# Patient Record
Sex: Female | Born: 1940
Health system: Southern US, Community
[De-identification: ages and names within clinical notes are randomized; demographics above are authoritative.]

## PROBLEM LIST (undated history)

## (undated) DIAGNOSIS — F0390 Unspecified dementia without behavioral disturbance: Secondary | ICD-10-CM

## (undated) DIAGNOSIS — K859 Acute pancreatitis without necrosis or infection, unspecified: Secondary | ICD-10-CM

## (undated) DIAGNOSIS — I10 Essential (primary) hypertension: Secondary | ICD-10-CM

## (undated) HISTORY — PX: CHOLECYSTECTOMY: SHX55

---

## 2012-11-17 ENCOUNTER — Emergency Department: Payer: Self-pay | Admitting: Emergency Medicine

## 2012-11-17 LAB — CBC WITH DIFFERENTIAL/PLATELET
Eosinophil %: 0.5 %
HGB: 13.8 g/dL (ref 12.0–16.0)
Lymphocyte #: 2.9 10*3/uL (ref 1.0–3.6)
Lymphocyte %: 32 %
MCH: 30.1 pg (ref 26.0–34.0)
MCHC: 35.1 g/dL (ref 32.0–36.0)
MCV: 86 fL (ref 80–100)
Monocyte #: 0.7 x10 3/mm (ref 0.2–0.9)
Neutrophil #: 5.3 10*3/uL (ref 1.4–6.5)
RBC: 4.58 10*6/uL (ref 3.80–5.20)
WBC: 9 10*3/uL (ref 3.6–11.0)

## 2012-11-17 LAB — COMPREHENSIVE METABOLIC PANEL
Albumin: 4 g/dL (ref 3.4–5.0)
Alkaline Phosphatase: 67 U/L (ref 50–136)
BUN: 13 mg/dL (ref 7–18)
Bilirubin,Total: 0.7 mg/dL (ref 0.2–1.0)
Chloride: 98 mmol/L (ref 98–107)
Co2: 26 mmol/L (ref 21–32)
Creatinine: 1.11 mg/dL (ref 0.60–1.30)
EGFR (African American): 57 — ABNORMAL LOW
Glucose: 88 mg/dL (ref 65–99)
Osmolality: 270 (ref 275–301)
Potassium: 2.9 mmol/L — ABNORMAL LOW (ref 3.5–5.1)
SGPT (ALT): 24 U/L (ref 12–78)
Total Protein: 7.6 g/dL (ref 6.4–8.2)

## 2012-11-17 LAB — CK TOTAL AND CKMB (NOT AT ARMC): CK-MB: <0.5 ng/mL — ABNORMAL LOW (ref 0.5–3.6)

## 2012-11-17 LAB — LIPASE, BLOOD: Lipase: 279 U/L (ref 73–393)

## 2012-11-17 LAB — MAGNESIUM: Magnesium: 1.8 mg/dL

## 2012-11-27 LAB — CBC
HCT: 38 % (ref 35.0–47.0)
HGB: 14 g/dL (ref 12.0–16.0)
MCH: 31.2 pg (ref 26.0–34.0)
MCHC: 36.7 g/dL — ABNORMAL HIGH (ref 32.0–36.0)
MCV: 85 fL (ref 80–100)
Platelet: 302 10*3/uL (ref 150–440)
RBC: 4.48 10*6/uL (ref 3.80–5.20)
RDW: 14 % (ref 11.5–14.5)
WBC: 9.6 10*3/uL (ref 3.6–11.0)

## 2012-11-27 LAB — CK TOTAL AND CKMB (NOT AT ARMC): CK-MB: 0.7 ng/mL (ref 0.5–3.6)

## 2012-11-27 LAB — COMPREHENSIVE METABOLIC PANEL
Alkaline Phosphatase: 69 U/L (ref 50–136)
Anion Gap: 11 (ref 7–16)
BUN: 13 mg/dL (ref 7–18)
Chloride: 89 mmol/L — ABNORMAL LOW (ref 98–107)
Co2: 27 mmol/L (ref 21–32)
EGFR (African American): >60
EGFR (Non-African Amer.): 57 — ABNORMAL LOW
Glucose: 77 mg/dL (ref 65–99)
Potassium: 3.2 mmol/L — ABNORMAL LOW (ref 3.5–5.1)
Total Protein: 7.8 g/dL (ref 6.4–8.2)

## 2012-11-28 ENCOUNTER — Observation Stay: Payer: Self-pay | Admitting: Family Medicine

## 2012-11-28 LAB — URINALYSIS, COMPLETE
Bilirubin,UR: NEGATIVE
Glucose,UR: NEGATIVE mg/dL (ref 0–75)
Hyaline Cast: 1
Nitrite: NEGATIVE
Ph: 5 (ref 4.5–8.0)
Protein: NEGATIVE
RBC,UR: 4 /HPF (ref 0–5)
Specific Gravity: 1.015 (ref 1.003–1.030)
Squamous Epithelial: 5

## 2012-11-28 LAB — BASIC METABOLIC PANEL
Anion Gap: 9 (ref 7–16)
Chloride: 91 mmol/L — ABNORMAL LOW (ref 98–107)
Co2: 27 mmol/L (ref 21–32)
EGFR (African American): >60
EGFR (Non-African Amer.): >60
Osmolality: 255 (ref 275–301)
Potassium: 3.1 mmol/L — ABNORMAL LOW (ref 3.5–5.1)
Sodium: 127 mmol/L — ABNORMAL LOW (ref 136–145)

## 2012-11-28 LAB — TROPONIN I
Troponin-I: <0.02 ng/mL
Troponin-I: <0.02 ng/mL

## 2012-11-28 LAB — T4, FREE: Free Thyroxine: 1.28 ng/dL (ref 0.76–1.46)

## 2012-11-28 LAB — PRO B NATRIURETIC PEPTIDE: B-Type Natriuretic Peptide: 144 pg/mL — ABNORMAL HIGH (ref 0–125)

## 2012-11-28 LAB — TSH: Thyroid Stimulating Horm: 0.873 u[IU]/mL

## 2012-11-29 LAB — CBC WITH DIFFERENTIAL/PLATELET
Basophil %: 0.6 %
Eosinophil %: 0.5 %
HCT: 35.7 % (ref 35.0–47.0)
HGB: 12.7 g/dL (ref 12.0–16.0)
Lymphocyte #: 2.3 10*3/uL (ref 1.0–3.6)
Lymphocyte %: 35.9 %
MCHC: 35.5 g/dL (ref 32.0–36.0)
Monocyte #: 0.6 x10 3/mm (ref 0.2–0.9)
Monocyte %: 8.5 %
WBC: 6.5 10*3/uL (ref 3.6–11.0)

## 2012-11-29 LAB — BASIC METABOLIC PANEL
Anion Gap: 4 — ABNORMAL LOW (ref 7–16)
BUN: 9 mg/dL (ref 7–18)
Calcium, Total: 8.6 mg/dL (ref 8.5–10.1)
Co2: 28 mmol/L (ref 21–32)
Creatinine: 0.76 mg/dL (ref 0.60–1.30)
EGFR (African American): >60
EGFR (Non-African Amer.): >60
Osmolality: 260 (ref 275–301)

## 2012-11-29 LAB — OSMOLALITY, URINE: Osmolality: 376 mOsm/kg

## 2012-11-29 LAB — SODIUM, URINE, RANDOM: Sodium, Urine Random: 63 mmol/L (ref 20–110)

## 2013-02-01 ENCOUNTER — Emergency Department: Payer: Self-pay | Admitting: Emergency Medicine

## 2013-02-01 LAB — COMPREHENSIVE METABOLIC PANEL
Anion Gap: 7 (ref 7–16)
BUN: 18 mg/dL (ref 7–18)
Bilirubin,Total: 0.3 mg/dL (ref 0.2–1.0)
Glucose: 86 mg/dL (ref 65–99)
Potassium: 3.7 mmol/L (ref 3.5–5.1)
SGOT(AST): 27 U/L (ref 15–37)
Sodium: 140 mmol/L (ref 136–145)
Total Protein: 7.4 g/dL (ref 6.4–8.2)

## 2013-02-01 LAB — CBC
HGB: 13.6 g/dL (ref 12.0–16.0)
MCH: 29.4 pg (ref 26.0–34.0)
MCV: 87 fL (ref 80–100)
Platelet: 295 10*3/uL (ref 150–440)
RBC: 4.61 10*6/uL (ref 3.80–5.20)
RDW: 14 % (ref 11.5–14.5)
WBC: 10.2 10*3/uL (ref 3.6–11.0)

## 2013-02-01 LAB — URINALYSIS, COMPLETE
Bilirubin,UR: NEGATIVE
Glucose,UR: NEGATIVE mg/dL (ref 0–75)
Ketone: NEGATIVE
Ph: 6 (ref 4.5–8.0)
RBC,UR: 5 /HPF (ref 0–5)
Squamous Epithelial: 10
WBC UR: 6 /HPF (ref 0–5)

## 2013-02-01 LAB — TROPONIN I: Troponin-I: <0.02 ng/mL

## 2013-02-01 LAB — DRUG SCREEN, URINE
Amphetamines, Ur Screen: NEGATIVE (ref ?–1000)
Barbiturates, Ur Screen: NEGATIVE (ref ?–200)
Cannabinoid 50 Ng, Ur ~~LOC~~: NEGATIVE (ref ?–50)
Cocaine Metabolite,Ur ~~LOC~~: NEGATIVE (ref ?–300)
MDMA (Ecstasy)Ur Screen: NEGATIVE (ref ?–500)
Methadone, Ur Screen: NEGATIVE (ref ?–300)
Opiate, Ur Screen: NEGATIVE (ref ?–300)
Phencyclidine (PCP) Ur S: NEGATIVE (ref ?–25)

## 2013-02-01 LAB — TSH: Thyroid Stimulating Horm: 0.91 u[IU]/mL

## 2013-02-01 LAB — ETHANOL: Ethanol %: <0.003 % (ref 0.000–0.080)

## 2015-03-03 ENCOUNTER — Emergency Department
Admit: 2015-03-03 | Disposition: A | Discharge status: Home / Self Care | Payer: Self-pay | Admitting: Emergency Medicine

## 2015-03-03 LAB — CBC
HCT: 32.2 % — AB (ref 35.0–47.0)
HGB: 10.4 g/dL — ABNORMAL LOW (ref 12.0–16.0)
MCH: 29.9 pg (ref 26.0–34.0)
MCHC: 32.4 g/dL (ref 32.0–36.0)
MCV: 92 fL (ref 80–100)
Platelet: 367 10*3/uL (ref 150–440)
RBC: 3.49 10*6/uL — ABNORMAL LOW (ref 3.80–5.20)
RDW: 14.6 % — AB (ref 11.5–14.5)
WBC: 11.4 10*3/uL — AB (ref 3.6–11.0)

## 2015-03-03 LAB — COMPREHENSIVE METABOLIC PANEL
ALT: 15 U/L
AST: 31 U/L
Albumin: 4.3 g/dL
Alkaline Phosphatase: 25 U/L — ABNORMAL LOW
Anion Gap: 10 (ref 7–16)
BILIRUBIN TOTAL: 0.9 mg/dL
BUN: 20 mg/dL
Calcium, Total: 8.9 mg/dL
Chloride: 100 mmol/L — ABNORMAL LOW
Co2: 23 mmol/L
Creatinine: 1.1 mg/dL — ABNORMAL HIGH
EGFR (African American): 57 — ABNORMAL LOW
EGFR (Non-African Amer.): 49 — ABNORMAL LOW
Glucose: 107 mg/dL — ABNORMAL HIGH
Potassium: 3.7 mmol/L
SODIUM: 133 mmol/L — AB
Total Protein: 7.2 g/dL

## 2015-03-03 LAB — URINALYSIS, COMPLETE
SPECIFIC GRAVITY: 1.023 (ref 1.003–1.030)
Squamous Epithelial: NONE SEEN

## 2015-03-07 NOTE — Discharge Summary (Signed)
PATIENT NAME:  Alexis Rodriguez, Alexis Rodriguez MR#:  [id number] DATE OF BIRTH:  1941-10-13  DATE OF ADMISSION:  11/28/2012  DATE OF DISCHARGE:  11/29/2012  ADMISSION DIAGNOSIS:  Severe weakness.  DISCHARGE DIAGNOSES: 1.  Generalized weakness.  2.  Hyponatremia.  3.  Hypokalemia.  4.  Urinary tract infection.  5.  Acute bronchitis.  6.  Dysphagia.  7.  History of cerebrovascular accident.  8.  Hypertension.  9.  Chest pain.  10.  Dehydration.   DISPOSITION:  Home.   MEDICATIONS AT DISCHARGE:  Aspirin 81 mg a day, Levaquin 250 mg once daily for 5 more days and lisinopril 20 mg once a day. Lisinopril prescription is to substitute previous prescription of lisinopril/hydrochlorothiazide that patient was taking. Patient no longer needs to take lisinopril/hydrochlorothiazide mixed pill, only the single lisinopril prescription that she has been given today.   IMPORTANT RESULTS: Sodium of 127 on admission, 132 at discharge; creatinine of 0.99, 0.76 at discharge. Other electrolytes: Potassium of 3.2 on admission, 4.9 at discharge. LFTs within normal limits. Troponins were negative. EKG: Normal sinus rhythm. Hemoglobin of 12.7, white count of 6.5. D-dimer was negative, 0.3. Negative influenza.  UA shows increased white blood cells of 10, leukocyte esterase +1 and positive red blood cells. Vitamin B12 level 390.   HOSPITAL COURSE:  The patient is a very nice 74 year old female with history of hypertension and previous stroke. She comes with multiple complaints of feeling very weak, short of breath with nonproductive cough, dysphagia. No fever, no chills. Had some tightness of the chest. Felt really weak. Her sodium level was found to be 127, and her potassium 3.1. The patient looked moderately dehydrated. She was put on IV fluids. Electrolytes were replaced.   The patient did not know what blood pressure medications she was taking, but at discharge we checked with the pharmacy, and the last time that she picked them up  was in a different city, [place]. They checked the central data, and the patient indeed was taking lisinopril with hydrochlorothiazide. Due to the hyponatremia, we stopped the hydrochlorothiazide component and gave her a prescription for lisinopril. The patient is discharged in good condition. She feels like she has enough support at home to take care of herself. We are trying to get her a primary care physician at this moment. The social worker is going to give her a list of the available physician, so she is going to check on it.   I discharged this patient today in good condition.   DISCHARGE TIME:  More than 30 minutes.   ____________________________ Hills Sink, MD rsg:dm D: 11/29/2012 16:11:59 ET T: 11/29/2012 19:51:38 ET JOB#: [id number]  cc: [place] Sink, MD, <Dictator> Averiana Clouatre America Brown MD ELECTRONICALLY SIGNED 12/08/2012 10:59

## 2015-03-07 NOTE — H&P (Signed)
PATIENT NAME:  Alexis Rodriguez, Alexis Rodriguez MR#:  [id number] DATE OF BIRTH:  1941/03/13  DATE OF ADMISSION:  11/28/2012  REFERRING PHYSICIAN: Dr. Conni Slipper.  PRIMARY CARE PHYSICIAN: Currently none, recently moved to [place].   CHIEF COMPLAINT: Shortness of breath, generalized weakness, cough and chest pain.   HISTORY OF PRESENT ILLNESS: This is a 74 year old female with significant past medical history of hypertension and CVA, who presents with multiple complaints. Family reports she has been feeling weak over the last couple of weeks as well as been complaining of shortness of breath and cough, nonproductive as well as had been complaining of some dysphagia as well. Denies any fever or chills. The patient's chest x-ray did not show any abnormalities. White blood cell count was within normal limits and she was afebrile. As well, she did have some complaints of chest pain mainly in the lower chest area accompanied with the cough that was reproducible by palpation. The patient's troponin was negative. She did not have any significant ST or T wave changes in her EKG. As well, she was complaining of feeling generally weak with having no energy and being forgetful. The patient was found to have low sodium level of 127 and low potassium level of 3.1. The patient was in the ED last week for similar complaints where her sodium level was then at 135. Hospitalist service was requested to admit the patient for further management and workup of her symptoms.   PAST MEDICAL HISTORY:  1.  Hypertension.  2.  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   PAST SURGICAL HISTORY: Cholecystectomy.   SOCIAL HISTORY: The patient denies any smoking or alcohol use.   FAMILY HISTORY: Significant for coronary artery disease and diabetes mellitus.   ALLERGIES:  1.  AMOXICILLIN. 2.  PENICILLIN. 3.  SHELLFISH.   HOME MEDICATIONS:  1.  Aspirin 81 mg daily.  2.  She takes blood pressure medication 1 pill a day. She cannot  recall the name.   REVIEW OF SYSTEMS:  CONSTITUTIONAL: Denies any fever or chills. Complains of fatigue and weakness.  EYES: Denies blurry vision, double vision, pain, inflammation or glaucoma. She has right eye blindness.  RESPIRATORY: She has complaints of cough and dyspnea. Denies any wheezing, hemoptysis or chronic obstructive pulmonary disease.  ENT: Denies any tinnitus, ear pain, hearing loss, allergies, epistaxis or discharge.  CARDIOVASCULAR: She has complaints of chest pain. Denies any edema, arrhythmia, palpitations or syncope.  GASTROINTESTINAL: Denies nausea, vomiting, diarrhea, abdominal pain, hematemesis, jaundice or rectal bleed.  GENITOURINARY: Denies dysuria, hematuria, or renal colic.  ENDOCRINE: Denies polyuria, polydipsia, heat or cold intolerance.  HEMATOLOGY: Denies anemia, easy bruising or bleeding diathesis.  INTEGUMENTARY: Denies acne, rash or skin lesions.  MUSCULOSKELETAL: Complains of generalized weakness. Denies arthritis, cramps, gout, limited activity or swelling.  NEUROLOGIC: Denies focal deficits, epilepsy, vertigo, ataxia, dementia or migraine. She has history of CVA in the past with concussion.  PSYCHIATRIC: Denies insomnia, schizophrenia, bipolar disorder or depression. Reports anxiety and nervousness.   PHYSICAL EXAMINATION:  VITAL SIGNS: Temperature 97.6, pulse 82, respiratory rate 18, blood pressure 149/87, saturating 97% on room air.  GENERAL: Well-nourished female who looks comfortable in bed in no apparent distress.  HEENT: Head atraumatic, normocephalic. Pupils equal, reactive to light. Pink conjunctivae. Anicteric sclerae. Moist oral mucosa.  NECK: Supple. No thyromegaly. No JVD.  CHEST: Good air entry bilaterally. No wheezing, rales or rhonchi.  CARDIOVASCULAR: S1, S2 heard. No rubs, murmur, or gallops.  ABDOMEN: Soft, nontender, nondistended. Bowel sounds  present.  EXTREMITIES: No edema. No clubbing. No cyanosis.  PSYCHIATRIC: Appropriate  affect. Awake, alert x 3. Intact judgment and insight.  NEUROLOGIC: Cranial nerves grossly intact. Motor 5 out of 5. Sensation symmetrical. Deep tendon reflexes +2 and symmetrical.  SKIN: Warm and dry. Normal skin turgor.   PERTINENT LABORATORIES: Glucose 82, proBNP 144, BUN 14, creatinine 0.83, sodium 127, potassium 3.1, chloride 91, CO2 27. White blood cells 9.6, hemoglobin 14, hematocrit 38, platelets 302. D-dimer 0.3. Urinalysis showing leukocyte esterase +1, white blood cells 10 and bacteria trace. CT of the brain did not show any acute findings. Chest x-ray was reviewed by me and did not show any opacity or infiltrate. No cardiomegaly. No acute cardiopulmonary disease.   ASSESSMENT AND PLAN: This is a 74 year old female, who presents with multiple nonspecific complaints, mainly shortness of breath, cough, generalized weakness as well found to have hyponatremia, hypokalemia and urinary tract infection. As well, complains of non-typical chest pain. 1.  Generalized weakness. This appears to be multifactorial, mainly due to electrolyte abnormality, hypokalemia and hyponatremia. As well, she is having urinary tract infection and acute bronchitis.  2.  Hyponatremia. This is most likely due to dehydration as she reports a decreased oral fluid intake. As well, it is unclear what blood pressure medication she is taking. If it is diuretics, most likely contributed to her hyponatremia. The patient will be started on intravenous normal saline. We will check urine and serum osmolality and will repeat level in the a.m.  3.  Hypokalemia. We will replace and recheck in a.m.  4.  Urinary tract infection. We will have her on Levaquin.  5.  Acute bronchitis. We will start the patient on Levaquin as well.  6.  Dysphagia. We will have swallow service to evaluate the patient.  7.  History of cerebrovascular accident, the patient denies any new complaints. We will continue her on aspirin.  8.  Hypertension. Currently  acceptable. We will await the family until they bring her medication list from home and resume her on home medications. 9.  Chest pain. This appears to be non-typical as she is having chest pain reproducible by palpation. It appears to be of musculoskeletal quality, most likely caused by her cough. She does not have any ST or T wave changes on her EKG. We will continue to cycle troponins.  10. Gastrointestinal prophylaxis. The patient is on proton pump inhibitor.  11. Deep vein thrombosis prophylaxis. We will start the patient on subcutaneous heparin.   TOTAL TIME SPENT ON ADMISSION AND PATIENT CARE: 60 minutes.   ____________________________ Albertine Patricia, MD dse:aw D: 11/28/2012 07:29:38 ET T: 11/28/2012 08:04:41 ET JOB#: [id number]  cc: Albertine Patricia, MD, <Dictator> DAWOOD Graciela Husbands MD ELECTRONICALLY SIGNED 12/01/2012 7:36

## 2015-03-08 LAB — URINE CULTURE

## 2016-04-27 DIAGNOSIS — F0150 Vascular dementia without behavioral disturbance: Secondary | ICD-10-CM | POA: Insufficient documentation

## 2016-04-27 DIAGNOSIS — G309 Alzheimer's disease, unspecified: Secondary | ICD-10-CM | POA: Insufficient documentation

## 2020-11-04 ENCOUNTER — Observation Stay: Payer: Medicare HMO

## 2020-11-04 ENCOUNTER — Encounter: Payer: Self-pay | Admitting: Emergency Medicine

## 2020-11-04 ENCOUNTER — Emergency Department: Payer: Medicare HMO

## 2020-11-04 ENCOUNTER — Inpatient Hospital Stay
Admission: EM | Admit: 2020-11-04 | Discharge: 2020-11-22 | DRG: 640 | Disposition: A | Discharge status: Skilled Nursing Facility | Payer: Medicare HMO | Attending: Internal Medicine | Admitting: Internal Medicine

## 2020-11-04 ENCOUNTER — Other Ambulatory Visit: Payer: Self-pay

## 2020-11-04 DIAGNOSIS — I10 Essential (primary) hypertension: Secondary | ICD-10-CM | POA: Diagnosis not present

## 2020-11-04 DIAGNOSIS — R4182 Altered mental status, unspecified: Secondary | ICD-10-CM

## 2020-11-04 DIAGNOSIS — I639 Cerebral infarction, unspecified: Secondary | ICD-10-CM

## 2020-11-04 DIAGNOSIS — N1831 Chronic kidney disease, stage 3a: Secondary | ICD-10-CM | POA: Diagnosis not present

## 2020-11-04 DIAGNOSIS — L89322 Pressure ulcer of left buttock, stage 2: Secondary | ICD-10-CM | POA: Diagnosis present

## 2020-11-04 DIAGNOSIS — R059 Cough, unspecified: Secondary | ICD-10-CM

## 2020-11-04 DIAGNOSIS — F0392 Unspecified dementia, unspecified severity, with psychotic disturbance: Secondary | ICD-10-CM | POA: Diagnosis present

## 2020-11-04 DIAGNOSIS — Z6830 Body mass index (BMI) 30.0-30.9, adult: Secondary | ICD-10-CM

## 2020-11-04 DIAGNOSIS — E162 Hypoglycemia, unspecified: Principal | ICD-10-CM

## 2020-11-04 DIAGNOSIS — F0390 Unspecified dementia without behavioral disturbance: Secondary | ICD-10-CM | POA: Diagnosis present

## 2020-11-04 DIAGNOSIS — I442 Atrioventricular block, complete: Secondary | ICD-10-CM | POA: Diagnosis not present

## 2020-11-04 DIAGNOSIS — R2990 Unspecified symptoms and signs involving the nervous system: Secondary | ICD-10-CM | POA: Diagnosis not present

## 2020-11-04 DIAGNOSIS — E871 Hypo-osmolality and hyponatremia: Secondary | ICD-10-CM | POA: Diagnosis not present

## 2020-11-04 DIAGNOSIS — L89896 Pressure-induced deep tissue damage of other site: Secondary | ICD-10-CM | POA: Diagnosis present

## 2020-11-04 DIAGNOSIS — R778 Other specified abnormalities of plasma proteins: Secondary | ICD-10-CM | POA: Diagnosis present

## 2020-11-04 DIAGNOSIS — E669 Obesity, unspecified: Secondary | ICD-10-CM | POA: Diagnosis present

## 2020-11-04 DIAGNOSIS — Z8673 Personal history of transient ischemic attack (TIA), and cerebral infarction without residual deficits: Secondary | ICD-10-CM

## 2020-11-04 DIAGNOSIS — L89311 Pressure ulcer of right buttock, stage 1: Secondary | ICD-10-CM | POA: Diagnosis present

## 2020-11-04 DIAGNOSIS — E861 Hypovolemia: Secondary | ICD-10-CM | POA: Diagnosis present

## 2020-11-04 DIAGNOSIS — R339 Retention of urine, unspecified: Secondary | ICD-10-CM | POA: Diagnosis not present

## 2020-11-04 DIAGNOSIS — I159 Secondary hypertension, unspecified: Secondary | ICD-10-CM

## 2020-11-04 DIAGNOSIS — A419 Sepsis, unspecified organism: Secondary | ICD-10-CM

## 2020-11-04 DIAGNOSIS — R7401 Elevation of levels of liver transaminase levels: Secondary | ICD-10-CM | POA: Diagnosis present

## 2020-11-04 DIAGNOSIS — L8990 Pressure ulcer of unspecified site, unspecified stage: Secondary | ICD-10-CM | POA: Insufficient documentation

## 2020-11-04 DIAGNOSIS — Z880 Allergy status to penicillin: Secondary | ICD-10-CM

## 2020-11-04 DIAGNOSIS — Z8249 Family history of ischemic heart disease and other diseases of the circulatory system: Secondary | ICD-10-CM

## 2020-11-04 DIAGNOSIS — Z23 Encounter for immunization: Secondary | ICD-10-CM

## 2020-11-04 DIAGNOSIS — Z9049 Acquired absence of other specified parts of digestive tract: Secondary | ICD-10-CM

## 2020-11-04 DIAGNOSIS — I129 Hypertensive chronic kidney disease with stage 1 through stage 4 chronic kidney disease, or unspecified chronic kidney disease: Secondary | ICD-10-CM | POA: Diagnosis present

## 2020-11-04 DIAGNOSIS — E876 Hypokalemia: Secondary | ICD-10-CM | POA: Diagnosis present

## 2020-11-04 DIAGNOSIS — R001 Bradycardia, unspecified: Secondary | ICD-10-CM | POA: Diagnosis present

## 2020-11-04 DIAGNOSIS — D509 Iron deficiency anemia, unspecified: Secondary | ICD-10-CM | POA: Diagnosis present

## 2020-11-04 DIAGNOSIS — Z91013 Allergy to seafood: Secondary | ICD-10-CM

## 2020-11-04 DIAGNOSIS — E785 Hyperlipidemia, unspecified: Secondary | ICD-10-CM | POA: Diagnosis present

## 2020-11-04 DIAGNOSIS — U071 COVID-19: Secondary | ICD-10-CM | POA: Diagnosis not present

## 2020-11-04 DIAGNOSIS — N183 Chronic kidney disease, stage 3 unspecified: Secondary | ICD-10-CM | POA: Diagnosis present

## 2020-11-04 DIAGNOSIS — F32A Depression, unspecified: Secondary | ICD-10-CM | POA: Diagnosis present

## 2020-11-04 DIAGNOSIS — D631 Anemia in chronic kidney disease: Secondary | ICD-10-CM | POA: Diagnosis present

## 2020-11-04 DIAGNOSIS — H5461 Unqualified visual loss, right eye, normal vision left eye: Secondary | ICD-10-CM | POA: Diagnosis present

## 2020-11-04 DIAGNOSIS — E538 Deficiency of other specified B group vitamins: Secondary | ICD-10-CM | POA: Diagnosis present

## 2020-11-04 DIAGNOSIS — Z87820 Personal history of traumatic brain injury: Secondary | ICD-10-CM

## 2020-11-04 DIAGNOSIS — R4781 Slurred speech: Secondary | ICD-10-CM | POA: Diagnosis present

## 2020-11-04 DIAGNOSIS — N179 Acute kidney failure, unspecified: Secondary | ICD-10-CM

## 2020-11-04 DIAGNOSIS — R8281 Pyuria: Secondary | ICD-10-CM | POA: Diagnosis present

## 2020-11-04 DIAGNOSIS — J9811 Atelectasis: Secondary | ICD-10-CM | POA: Diagnosis not present

## 2020-11-04 HISTORY — DX: Unspecified dementia, unspecified severity, without behavioral disturbance, psychotic disturbance, mood disturbance, and anxiety: F03.90

## 2020-11-04 HISTORY — DX: Essential (primary) hypertension: I10

## 2020-11-04 HISTORY — DX: Acute pancreatitis without necrosis or infection, unspecified: K85.90

## 2020-11-04 LAB — CBG MONITORING, ED
Glucose-Capillary: 50 mg/dL — ABNORMAL LOW (ref 70–99)
Glucose-Capillary: 172 mg/dL — ABNORMAL HIGH (ref 70–99)
Glucose-Capillary: 32 mg/dL — CL (ref 70–99)
Glucose-Capillary: 34 mg/dL — CL (ref 70–99)
Glucose-Capillary: 106 mg/dL — ABNORMAL HIGH (ref 70–99)
Glucose-Capillary: 106 mg/dL — ABNORMAL HIGH (ref 70–99)
Glucose-Capillary: 170 mg/dL — ABNORMAL HIGH (ref 70–99)

## 2020-11-04 LAB — CBC
HCT: 41.5 % (ref 36.0–46.0)
Hemoglobin: 13.7 g/dL (ref 12.0–15.0)
MCH: 30.4 pg (ref 26.0–34.0)
MCHC: 33 g/dL (ref 30.0–36.0)
MCV: 92.2 fL (ref 80.0–100.0)
Platelets: 306 K/uL (ref 150–400)
RBC: 4.5 MIL/uL (ref 3.87–5.11)
RDW: 13.2 % (ref 11.5–15.5)
WBC: 9.5 K/uL (ref 4.0–10.5)
nRBC: 0 % (ref 0.0–0.2)

## 2020-11-04 LAB — URINALYSIS, COMPLETE (UACMP) WITH MICROSCOPIC
Bilirubin Urine: NEGATIVE
Glucose, UA: 150 mg/dL — AB
Hgb urine dipstick: NEGATIVE
Ketones, ur: NEGATIVE mg/dL
Nitrite: NEGATIVE
Protein, ur: NEGATIVE mg/dL
Specific Gravity, Urine: 1.008 (ref 1.005–1.030)
pH: 7 (ref 5.0–8.0)

## 2020-11-04 LAB — COMPREHENSIVE METABOLIC PANEL WITH GFR
ALT: 25 U/L (ref 0–44)
AST: 47 U/L — ABNORMAL HIGH (ref 15–41)
Albumin: 3.8 g/dL (ref 3.5–5.0)
Alkaline Phosphatase: 35 U/L — ABNORMAL LOW (ref 38–126)
Anion gap: 9 (ref 5–15)
BUN: 14 mg/dL (ref 8–23)
CO2: 28 mmol/L (ref 22–32)
Calcium: 9.2 mg/dL (ref 8.9–10.3)
Chloride: 100 mmol/L (ref 98–111)
Creatinine, Ser: 1.2 mg/dL — ABNORMAL HIGH (ref 0.44–1.00)
GFR, Estimated: 46 mL/min — ABNORMAL LOW (ref >60)
Glucose, Bld: 39 mg/dL — CL (ref 70–99)
Potassium: 4.1 mmol/L (ref 3.5–5.1)
Sodium: 137 mmol/L (ref 135–145)
Total Bilirubin: 0.5 mg/dL (ref 0.3–1.2)
Total Protein: 7.8 g/dL (ref 6.5–8.1)

## 2020-11-04 LAB — DIFFERENTIAL
Abs Immature Granulocytes: 0.03 K/uL (ref 0.00–0.07)
Basophils Absolute: 0.1 K/uL (ref 0.0–0.1)
Basophils Relative: 1 %
Eosinophils Absolute: 0.1 K/uL (ref 0.0–0.5)
Eosinophils Relative: 1 %
Immature Granulocytes: 0 %
Lymphocytes Relative: 25 %
Lymphs Abs: 2.4 K/uL (ref 0.7–4.0)
Monocytes Absolute: 0.7 K/uL (ref 0.1–1.0)
Monocytes Relative: 8 %
Neutro Abs: 6.3 K/uL (ref 1.7–7.7)
Neutrophils Relative %: 65 %

## 2020-11-04 LAB — RESP PANEL BY RT-PCR (FLU A&B, COVID) ARPGX2
Influenza A by PCR: NEGATIVE
Influenza B by PCR: NEGATIVE
SARS Coronavirus 2 by RT PCR: NEGATIVE

## 2020-11-04 LAB — TROPONIN I (HIGH SENSITIVITY)
Troponin I (High Sensitivity): 20 ng/L — ABNORMAL HIGH (ref <18)
Troponin I (High Sensitivity): 21 ng/L — ABNORMAL HIGH (ref <18)

## 2020-11-04 LAB — PROTIME-INR
INR: 1.1 (ref 0.8–1.2)
Prothrombin Time: 13.9 seconds (ref 11.4–15.2)

## 2020-11-04 LAB — GLUCOSE, CAPILLARY: Glucose-Capillary: 111 mg/dL — ABNORMAL HIGH (ref 70–99)

## 2020-11-04 LAB — APTT: aPTT: 25 seconds (ref 24–36)

## 2020-11-04 MED ORDER — SODIUM CHLORIDE 0.9% FLUSH
3.0000 mL | Freq: Once | INTRAVENOUS | Status: AC
  Administered 2020-11-04: 14:00:00 3 mL via INTRAVENOUS

## 2020-11-04 MED ORDER — DEXTROSE 50 % IV SOLN
50.0000 mL | INTRAVENOUS | Status: DC | PRN
  Administered 2020-11-05 – 2020-11-10 (×3): 50 mL via INTRAVENOUS
  Filled 2020-11-04 (×3): qty 50

## 2020-11-04 MED ORDER — SERTRALINE HCL 50 MG PO TABS
25.0000 mg | ORAL_TABLET | Freq: Every day | ORAL | Status: DC
  Administered 2020-11-05 – 2020-11-10 (×6): 25 mg via ORAL
  Filled 2020-11-04 (×6): qty 1

## 2020-11-04 MED ORDER — MELATONIN 5 MG PO TABS
5.0000 mg | ORAL_TABLET | Freq: Every day | ORAL | Status: DC
  Administered 2020-11-06 – 2020-11-21 (×16): 5 mg via ORAL
  Filled 2020-11-04 (×20): qty 1

## 2020-11-04 MED ORDER — STROKE: EARLY STAGES OF RECOVERY BOOK: Freq: Once | Status: AC

## 2020-11-04 MED ORDER — ACETAMINOPHEN 650 MG RE SUPP
650.0000 mg | RECTAL | Status: DC | PRN
  Administered 2020-11-12: 17:00:00 650 mg via RECTAL
  Filled 2020-11-04: qty 1

## 2020-11-04 MED ORDER — ACETAMINOPHEN 160 MG/5ML PO SOLN
650.0000 mg | ORAL | Status: DC | PRN
Start: 2020-11-04 — End: 2020-11-17
  Filled 2020-11-04: qty 20.3

## 2020-11-04 MED ORDER — DEXTROSE 50 % IV SOLN
INTRAVENOUS | Status: AC
  Administered 2020-11-04: 14:00:00 50 mL
  Filled 2020-11-04: qty 50

## 2020-11-04 MED ORDER — DONEPEZIL HCL 5 MG PO TABS
10.0000 mg | ORAL_TABLET | Freq: Every day | ORAL | Status: DC
  Administered 2020-11-06 – 2020-11-09 (×4): 10 mg via ORAL
  Filled 2020-11-04 (×4): qty 2

## 2020-11-04 MED ORDER — HYDRALAZINE HCL 20 MG/ML IJ SOLN
5.0000 mg | INTRAMUSCULAR | Status: DC | PRN
  Administered 2020-11-14: 07:00:00 5 mg via INTRAVENOUS
  Filled 2020-11-04: qty 1

## 2020-11-04 MED ORDER — MEMANTINE HCL ER 28 MG PO CP24
28.0000 mg | ORAL_CAPSULE | Freq: Every day | ORAL | Status: DC
  Administered 2020-11-06 – 2020-11-21 (×16): 28 mg via ORAL
  Filled 2020-11-04 (×19): qty 1

## 2020-11-04 MED ORDER — ACETAMINOPHEN 325 MG PO TABS
650.0000 mg | ORAL_TABLET | ORAL | Status: DC | PRN
  Administered 2020-11-10 – 2020-11-13 (×7): 650 mg via ORAL
  Filled 2020-11-04 (×7): qty 2

## 2020-11-04 MED ORDER — MEMANTINE HCL-DONEPEZIL HCL ER 28-10 MG PO CP24: 1.0000 | ORAL_CAPSULE | Freq: Every day | ORAL | Status: DC

## 2020-11-04 MED ORDER — ENOXAPARIN SODIUM 40 MG/0.4ML ~~LOC~~ SOLN
40.0000 mg | Freq: Every day | SUBCUTANEOUS | Status: DC
  Administered 2020-11-05 – 2020-11-09 (×6): 40 mg via SUBCUTANEOUS
  Filled 2020-11-04 (×4): qty 0.4

## 2020-11-04 MED ORDER — MAGNESIUM GLUCONATE 500 MG PO TABS
250.0000 mg | ORAL_TABLET | Freq: Every day | ORAL | Status: DC
  Administered 2020-11-05 – 2020-11-22 (×18): 250 mg via ORAL
  Filled 2020-11-04 (×19): qty 1

## 2020-11-04 MED ORDER — DEXTROSE 50 % IV SOLN: 1.0000 | Freq: Once | INTRAVENOUS | Status: AC

## 2020-11-04 MED ORDER — ASPIRIN EC 81 MG PO TBEC
81.0000 mg | DELAYED_RELEASE_TABLET | Freq: Every day | ORAL | Status: DC
  Administered 2020-11-05 – 2020-11-22 (×18): 81 mg via ORAL
  Filled 2020-11-04 (×18): qty 1

## 2020-11-04 MED ORDER — FENOFIBRATE 160 MG PO TABS
160.0000 mg | ORAL_TABLET | Freq: Every day | ORAL | Status: DC
  Administered 2020-11-05 – 2020-11-09 (×5): 160 mg via ORAL
  Filled 2020-11-04 (×6): qty 1

## 2020-11-04 MED ORDER — ENOXAPARIN SODIUM 30 MG/0.3ML ~~LOC~~ SOLN: 30.0000 mg | SUBCUTANEOUS | Status: DC

## 2020-11-04 MED ORDER — ONDANSETRON HCL 4 MG/2ML IJ SOLN: 4.0000 mg | Freq: Three times a day (TID) | INTRAMUSCULAR | Status: DC | PRN

## 2020-11-04 MED ORDER — MAGNESIUM 250 MG PO TABS: 250.0000 mg | ORAL_TABLET | Freq: Every day | ORAL | Status: DC

## 2020-11-04 MED ORDER — FERROUS SULFATE 325 (65 FE) MG PO TABS
325.0000 mg | ORAL_TABLET | Freq: Every day | ORAL | Status: DC
Start: 2020-11-05 — End: 2020-11-22
  Administered 2020-11-05 – 2020-11-22 (×18): 325 mg via ORAL
  Filled 2020-11-04 (×18): qty 1

## 2020-11-04 MED ORDER — DEXTROSE 50 % IV SOLN
INTRAVENOUS | Status: AC
  Administered 2020-11-04: 15:00:00 50 mL via INTRAVENOUS
  Filled 2020-11-04: qty 50

## 2020-11-04 MED ORDER — DEXTROSE-NACL 10-0.45 % IV SOLN
INTRAVENOUS | Status: DC
  Filled 2020-11-04 (×4): qty 1000

## 2020-11-04 MED ORDER — OLANZAPINE 5 MG PO TABS
5.0000 mg | ORAL_TABLET | Freq: Every day | ORAL | Status: DC
  Administered 2020-11-06 – 2020-11-21 (×16): 5 mg via ORAL
  Filled 2020-11-04 (×19): qty 1

## 2020-11-04 NOTE — Progress Notes (Signed)
   11/04/20 1325  Clinical Encounter Type  Visited With Family;Patient and family together  Visit Type Initial  Referral From Nurse  Consult/Referral To Chaplain  Chaplain responded to CODE STROKE PG. When she arrived Pt's daughter was talking to the nurse. Chaplain told her she would be back. Chaplain left and came back shortly thereafter and visited with daughter, Alexis Rodriguez while staff worked with Pt. Chaplain hugged daughter several times and prayed with her. Daughter, who was teary eyed thanked chaplain several times saying she felt better. Daughter was grateful for the visit, but especially for the pray. Before leaving chaplain spoke to Pt. And before leaving daughter hugged chaplain again. Daughter needed the hugs.

## 2020-11-04 NOTE — ED Notes (Signed)
Code Stroke called to [facility] [address] at [employer] at [address] with vision problems and can't seem to find her words, last known well is 12noon today called per Francene Boyers

## 2020-11-04 NOTE — ED Provider Notes (Signed)
[facility] Emergency Department Provider Note   ____________________________________________   Event Date/Time   First MD Initiated Contact with Patient 11/04/20 1335     (approximate)  I have reviewed the triage vital signs and the nursing notes.   HISTORY  Chief Complaint Code Stroke (Possible seizure)    HPI Alexis Rodriguez is a 79 y.o. female with past medical history of hypertension and dementia who presents to the ED for possible stroke.  42 of history is obtained from patient's daughter due to her history of dementia.  Daughter states that the patient has been having intermittent episodes of altered mental status for least the past month.  Patient had another 1 of these episodes today where she suddenly seemed slow to respond and was staring off with slurred speech.  She did not lose consciousness but seemed confused beyond her baseline when waking up, daughter reports she does not remember any of the episode.  Patient has otherwise been well recently with no fevers, cough, chest pain, shortness of breath, vomiting, or diarrhea.  Daughter states patient's baseline is to know her name and where she is, but she does not usually remember the year or month.        Past Medical History:  Diagnosis Date  . Dementia ([place])   . Hypertension     There are no problems to display for this patient.   History reviewed. No pertinent surgical history.  Prior to Admission medications   Not on File    Allergies Patient has no allergy information on record.  No family history on file.  Social History    Review of Systems Unable to obtain secondary to altered mental status.  ____________________________________________   PHYSICAL EXAM:  VITAL SIGNS: ED Triage Vitals [11/04/20 1320]  Enc Vitals Group     BP      Pulse      Resp      Temp      Temp src      SpO2      Weight      Height      Head Circumference      Peak Flow      Pain  Score 0     Pain Loc      Pain Edu?      Excl. in [place]?    Constitutional: Alert and oriented to person and place, but not time. Eyes: Conjunctivae are normal.  Pupils equal round and reactive to light bilaterally. Head: Atraumatic. Nose: No congestion/rhinnorhea. Mouth/Throat: Mucous membranes are moist. Neck: Normal ROM Cardiovascular: Normal rate, regular rhythm. Grossly normal heart sounds. Respiratory: Normal respiratory effort.  No retractions. Lungs CTAB. Gastrointestinal: Soft and nontender. No distention. Genitourinary: deferred Musculoskeletal: No lower extremity tenderness nor edema. Neurologic:  Normal speech and language. No gross focal neurologic deficits are appreciated. Skin:  Skin is warm, dry and intact. No rash noted. Psychiatric: Mood and affect are normal. Speech and behavior are normal.  ____________________________________________   LABS (all labs ordered are listed, but only abnormal results are displayed)  Labs Reviewed  COMPREHENSIVE METABOLIC PANEL - Abnormal; Notable for the following components:      Result Value   Glucose, Bld 39 (*)    Creatinine, Ser 1.20 (*)    AST 47 (*)    Alkaline Phosphatase 35 (*)    GFR, Estimated 46 (*)    All other components within normal limits  CBG MONITORING, ED - Abnormal; Notable for the following components:  Glucose-Capillary 172 (*)    All other components within normal limits  CBG MONITORING, ED - Abnormal; Notable for the following components:   Glucose-Capillary 34 (*)    All other components within normal limits  CBG MONITORING, ED - Abnormal; Notable for the following components:   Glucose-Capillary 32 (*)    All other components within normal limits  RESP PANEL BY RT-PCR (FLU A&B, COVID) ARPGX2  PROTIME-INR  APTT  CBC  DIFFERENTIAL  URINALYSIS, COMPLETE (UACMP) WITH MICROSCOPIC  TROPONIN I (HIGH SENSITIVITY)  TROPONIN I (HIGH SENSITIVITY)    ____________________________________________  EKG  ED ECG REPORT I, Blake Divine, the attending physician, personally viewed and interpreted this ECG.   Date: 11/04/2020  EKG Time: 13:42  Rate: 70  Rhythm: normal sinus rhythm  Axis: Normal  Intervals:nonspecific intraventricular conduction delay  ST&T Change: None   PROCEDURES  Procedure(s) performed (including Critical Care):  Procedures   ____________________________________________   INITIAL IMPRESSION / ASSESSMENT AND PLAN / ED COURSE       79 year old female with past medical history of hypertension and dementia who presents to the ED for episode of altered mental status where she became less responsive and seemed to have slurred speech.  Code stroke called from triage and patient brought back to CT, images reviewed by me and no obvious hemorrhage noted.  CT head negative for acute process per radiology.  Patient evaluated by Dr. Doy Mince of neurology, who states stroke less likely and recommends holding off on TPA.  Patient is also not a candidate for endovascular intervention and we will hold off on CTA at this time.  She does appear back to her baseline currently with no focal neurologic deficits.  Fingerstick glucose was noted to be 50, which could be contributing to her episode, patient was given an amp of D50 and we will continue to monitor her glucose levels, check for infectious process.  Remainder of labs are unremarkable, however patient has now dropped her blood glucose again.  She remains awake and alert, was given another amp of D50 as well as something to eat.  We will start her on a D10 drip and admit for further work-up of these episodes of altered mental status as well as hypoglycemia.  No obvious infectious process at this time, but UA is pending.       ____________________________________________   FINAL CLINICAL IMPRESSION(S) / ED DIAGNOSES  Final diagnoses:  Altered mental status, unspecified  altered mental status type  Hypoglycemia     ED Discharge Orders    None       Note:  This document was prepared using Dragon voice recognition software and may include unintentional dictation errors.   Blake Divine, MD 11/04/20 1540

## 2020-11-04 NOTE — ED Notes (Signed)
Olivia RN in CT at this time

## 2020-11-04 NOTE — ED Notes (Signed)
Reynolds at bedside in [place] pt

## 2020-11-04 NOTE — ED Notes (Signed)
Pt arrived to CT. 

## 2020-11-04 NOTE — Consult Note (Signed)
PHARMACIST CODE STROKE RESPONSE  Pharmacy responded to the code. No tPA to be given per Dr. Doy Mince.   Alexis Rodriguez 11/04/20 2:51 PM

## 2020-11-04 NOTE — Consult Note (Signed)
Requesting Physician: Charna Archer    Chief Complaint: Episode of unresponsiveness  I have been asked by Dr. Charna Archer to see this patient in consultation for code stroke.  HPI: Alexis Rodriguez is an 79 y.o. female with a history of concussion and CVA, dementia, B12 deficiency with resultant right eye blindness and HTN who presents after an episode of poor responsiveness and loss of vision.  Patient's first episode was about a month ago.  Describes episode as patient becoming poorly responsive.  At times will rub her face or legs.  Speech is either not present or stuttering.  Today also described loss of vision in her left eye.  Has been noted to have low blood sugar at this time.  On other occasions has been noted to have low blood pressure.  Has had about 4 since her initial one about a month ago.  Had an episode today while riding in the car with her daughter.  Initial NIHSS of 3. CBG 50.    Date last known well: Date: 11/04/2020 Time last known well: Time: 12:00 tPA Given: No: Resolution of symptoms  Past medical history: CVA, HTN  Surgical history: cholecystectomy  Family history: Parents deceased.  Mother with DM.  Father with heart disease.    Social History:  has no history on file for tobacco use, alcohol use, and drug use.  Allergies: Not on File  Medications:  Prior to Admission medications   Not on File     ROS: History obtained from the patient  General ROS: negative for - chills, fatigue, fever, night sweats, weight gain or weight loss Psychological ROS: memory difficulties Ophthalmic ROS: right eye blindness ENT ROS: negative for - epistaxis, nasal discharge, oral lesions, sore throat, tinnitus or vertigo Allergy and Immunology ROS: negative for - hives or itchy/watery eyes Hematological and Lymphatic ROS: negative for - bleeding problems, bruising or swollen lymph nodes Endocrine ROS: negative for - galactorrhea, hair pattern changes, polydipsia/polyuria or temperature  intolerance Respiratory ROS: negative for - cough, hemoptysis, shortness of breath or wheezing Cardiovascular ROS: negative for - chest pain, dyspnea on exertion, edema or irregular heartbeat Gastrointestinal ROS: negative for - abdominal pain, diarrhea, hematemesis, nausea/vomiting or stool incontinence Genito-Urinary ROS: incontinence Musculoskeletal ROS: negative for - joint swelling or muscular weakness Neurological ROS: as noted in HPI Dermatological ROS: negative for rash and skin lesion changes  Physical Examination: Weight 78.7 kg.  HEENT-  Normocephalic, no lesions, without obvious abnormality.  Normal external eye and conjunctiva.  Normal external ears. Normal external nose, mucus membranes and septum.  Normal pharynx. Cardiovascular- S1, S2 normal, pulses palpable throughout   Lungs- chest clear, no wheezing, rales, normal symmetric air entry Abdomen- soft, non-tender; bowel sounds normal; no masses,  no organomegaly Extremities- no edema Lymph-no adenopathy palpable Musculoskeletal-no joint tenderness, deformity or swelling Skin-warm and dry, no hyperpigmentation, vitiligo, or suspicious lesions  Neurological Examination   Mental Status: Alert.  Unable to tell the month or her age.  Follows simple commands but needs extensive reinforcement for 3-step commands.  Speech minimal but fluent.   Cranial Nerves: II: Blind in the right eye.  Visual fields grossly normal in the left eye. III,IV, VI: ptosis not present, extra-ocular motions intact bilaterally V,VII: smile symmetric, facial light touch sensation normal bilaterally VIII: hearing normal bilaterally IX,X: gag reflex present XI: bilateral shoulder shrug XII: midline tongue extension Motor: Right : Upper extremity   5/5    Left:     Upper extremity   5/5  Lower  extremity   5/5     Lower extremity   5/5 Increased tone throughout Sensory: Pinprick and light touch intact throughout, bilaterally Deep Tendon Reflexes:  Symmetric throughout Plantars: Right: mute   Left: mute Cerebellar: Normal finger-to-nose and normal heel-to-shin testing bilaterally Gait: not tested due to safety c    Laboratory Studies:  Basic Metabolic Panel: No results for input(s): NA, K, CL, CO2, GLUCOSE, BUN, CREATININE, CALCIUM, MG, PHOS in the last 168 hours.  Liver Function Tests: No results for input(s): AST, ALT, ALKPHOS, BILITOT, PROT, ALBUMIN in the last 168 hours. No results for input(s): LIPASE, AMYLASE in the last 168 hours. No results for input(s): AMMONIA in the last 168 hours.  CBC: No results for input(s): WBC, NEUTROABS, HGB, HCT, MCV, PLT in the last 168 hours.  Cardiac Enzymes: No results for input(s): CKTOTAL, CKMB, CKMBINDEX, TROPONINI in the last 168 hours.  BNP: Invalid input(s): POCBNP  CBG: No results for input(s): GLUCAP in the last 168 hours.  Microbiology: No results found for this or any previous visit.  Coagulation Studies: No results for input(s): LABPROT, INR in the last 72 hours.  Urinalysis: No results for input(s): COLORURINE, LABSPEC, PHURINE, GLUCOSEU, HGBUR, BILIRUBINUR, KETONESUR, PROTEINUR, UROBILINOGEN, NITRITE, LEUKOCYTESUR in the last 168 hours.  Invalid input(s): APPERANCEUR  Lipid Panel: No results found for: CHOL, TRIG, HDL, CHOLHDL, VLDL, LDLCALC  HgbA1C: No results found for: HGBA1C  Urine Drug Screen:      Component Value Date/Time   LABOPIA NEGATIVE 02/01/2013 1531   COCAINSCRNUR NEGATIVE 02/01/2013 1531   LABBENZ NEGATIVE 02/01/2013 1531   AMPHETMU NEGATIVE 02/01/2013 1531   THCU NEGATIVE 02/01/2013 1531   LABBARB NEGATIVE 02/01/2013 1531    Alcohol Level: No results for input(s): ETH in the last 168 hours.   Imaging: CT HEAD CODE STROKE WO CONTRAST  Result Date: 11/04/2020 CLINICAL DATA:  Code stroke. Neuro deficit, acute stroke suspected. Sudden speech in vision issues. EXAM: CT HEAD WITHOUT CONTRAST TECHNIQUE: Contiguous axial images were  obtained from the base of the skull through the vertex without intravenous contrast. COMPARISON:  CT head February 01, 2013. FINDINGS: Brain: No evidence of acute large vascular territory infarction, hemorrhage, hydrocephalus, extra-axial collection or mass lesion/mass effect. Patchy white matter hypoattenuation, most likely related to chronic microvascular disease. Mild generalized cerebral volume loss with ex vacuo ventricular dilation. Vascular: No hyperdense vessel identified. Calcific atherosclerosis. Skull: No acute fracture. Sinuses/Orbits: No acute findings. Other: No mastoid effusions. ASPECTS [facility] Stroke Program Early CT Score) total score (0-10 with 10 being normal): 10. IMPRESSION: 1. No evidence of acute intracranial abnormality.ASPECTS is 10. 2. Chronic microvascular ischemic disease and generalized cerebral volume loss. Code stroke imaging results were communicated on 11/04/2020 at 1:40 pm to provider Dr. Charna Archer Via telephone, who verbally acknowledged these results. Electronically Signed   By: Margaretha Sheffield MD   On: 11/04/2020 13:42    Assessment: 79 y.o. female with a history of concussion and CVA with resultant right eye blindness, dementia, B12 deficiency and HTN who presents after an episode of poor responsiveness and loss of vision.  Patient's first episode was about a month ago.  Describes episode as patient becoming poorly responsive.  At times will rub her face or legs.  Speech is either not present or stuttering.  Today also described loss of vision in her left eye.  Has been noted to have low blood sugar at this time.  On other occasions has been noted to have low blood pressure.  Has had  about 4 since her initial one about a month ago.  Had an episode today while riding in the car with her daughter.  Initial NIHSS of 3.  Symptoms resolving by the time of evaluation.  CBG 50.   Head CT personally reviewed and shows no acute changes.   With consideration of history it is unclear  that these episodes are actually CVA/TIA.  Can not rule out wither hypotensive or hypoglycemic episodes.  With history of dementia, seizure is on the differential as well.  Further work up is recommended.   Patient on ASA.   Stroke Risk Factors - hypertension  Plan: 1. HgbA1c, fasting lipid panel 2. MRI of the brain without contrast 3. Echocardiogram 4. Carotid dopplers 5. Continue ASA 6. EEG 7. Telemetry monitoring 8. Frequent neuro checks 9. Blood sugar monitoring 10. Orthostatic vitals   Alexis Goodell, MD Neurology  11/04/2020, 1:50 PM

## 2020-11-04 NOTE — H&P (Signed)
History and Physical    Alexis Rodriguez WUJ:[id number] DOB: 1940-11-26 DOA: 11/04/2020  Referring MD/NP/PA:   PCP: Perrin Maltese, MD   Patient coming from:  The patient is coming from home.  At baseline, pt is dependent for most of ADL.        Chief Complaint: strokelike symptoms  HPI: Alexis Rodriguez is a 79 y.o. female with medical history significant of hypertension, hyperlipidemia, dementia, iron deficiency anemia, CKD-3, who presents with strokelike symptoms.  Per her daughter, patient has strokelike symptoms for about 1 month. Symptoms have been going on intermittently, including confusion, blurry vision, slurred speech, slow response with staring off. No episode of fully loss of consciousness. No facial droop. Patient moves all extremities. Patient does not have chest pain, cough, shortness breath, fever or chills. No nausea, vomiting, diarrhea, abdominal pain, symptoms of UTI. Per her daughter, patient's mental status is close to her baseline currently.  Patient was found to have hypoglycemia with CBG down to 39 in ED. D10-1/2NS started in ED.  ED Course: pt was found to have WBC 9.5, negative Covid PCR, INR 1.1, PTT 25, stable renal function, blood pressure 147/60, heart rate 73, RR 20, oxygen saturation 95, pending body temperature measurement, CT head is negative for acute intracranial abnormalities. Patient is placed on MedSurg bed for observation. Dr. Leane Para for neurology is consulted.  Review of Systems: Reviewed accurately due to dementia   Allergy:  Allergies  Allergen Reactions  . Shellfish Allergy Anaphylaxis  . Penicillins     Other reaction(s): Unknown    Past Medical History:  Diagnosis Date  . Dementia ([place])   . Hypertension   . Pancreatitis     Past Surgical History:  Procedure Laterality Date  . CHOLECYSTECTOMY      Social History:  reports that she has never smoked. She has never used smokeless tobacco. She reports that she does not drink alcohol and  does not use drugs.  Family History:  Family History  Problem Relation Age of Onset  . Diabetes Mellitus II Mother   . Atrial fibrillation Sister      Prior to Admission medications   Not on File    Physical Exam: Vitals:   11/04/20 1630 11/04/20 1700 11/04/20 1730 11/04/20 1800  BP: 140/60 (!) 151/54 (!) 152/103 (!) 145/56  Pulse: 68 64 60 65  Resp: 15 20 15 16   SpO2: 96% 96% 96% 96%  Weight:       General: Not in acute distress HEENT:       Eyes:  Left eye PERRL, EOMI, no scleral icterus.       ENT: No discharge from the ears and nose, no pharynx injection, no tonsillar enlargement.        Neck: No JVD, no bruit, no mass felt. Heme: No neck lymph node enlargement. Cardiac: S1/S2, RRR, No murmurs, No gallops or rubs. Respiratory:  No rales, wheezing, rhonchi or rubs. GI: Soft, nondistended, nontender, no organomegaly, BS present. GU: No hematuria Ext: No pitting leg edema bilaterally. 2+DP/PT pulse bilaterally. Musculoskeletal: No joint deformities, No joint redness or warmth, no limitation of ROM in spin. Skin: No rashes.  Neuro: Alert, confused, knows her own name, recognize her daughter, not orientated to time. cranial nerves II-XII grossly intact, moves all extremities.  Psych: Patient is not psychotic, no suicidal or hemocidal ideation.  Labs on Admission: I have personally reviewed following labs and imaging studies  CBC: Recent Labs  Lab 11/04/20 1345  WBC 9.5  NEUTROABS 6.3  HGB 13.7  HCT 41.5  MCV 92.2  PLT 604   Basic Metabolic Panel: Recent Labs  Lab 11/04/20 1345  NA 137  K 4.1  CL 100  CO2 28  GLUCOSE 39*  BUN 14  CREATININE 1.20*  CALCIUM 9.2   GFR: CrCl cannot be calculated (Unknown ideal weight.). Liver Function Tests: Recent Labs  Lab 11/04/20 1345  AST 47*  ALT 25  ALKPHOS 35*  BILITOT 0.5  PROT 7.8  ALBUMIN 3.8   No results for input(s): LIPASE, AMYLASE in the last 168 hours. No results for input(s): AMMONIA in the  last 168 hours. Coagulation Profile: Recent Labs  Lab 11/04/20 1345  INR 1.1   Cardiac Enzymes: No results for input(s): CKTOTAL, CKMB, CKMBINDEX, TROPONINI in the last 168 hours. BNP (last 3 results) No results for input(s): PROBNP in the last 8760 hours. HbA1C: No results for input(s): HGBA1C in the last 72 hours. CBG: Recent Labs  Lab 11/04/20 1351 11/04/20 1502 11/04/20 1505 11/04/20 1617 11/04/20 1719  GLUCAP 172* 34* 32* 106* 106*   Lipid Profile: No results for input(s): CHOL, HDL, LDLCALC, TRIG, CHOLHDL, LDLDIRECT in the last 72 hours. Thyroid Function Tests: No results for input(s): TSH, T4TOTAL, FREET4, T3FREE, THYROIDAB in the last 72 hours. Anemia Panel: No results for input(s): VITAMINB12, FOLATE, FERRITIN, TIBC, IRON, RETICCTPCT in the last 72 hours. Urine analysis:    Component Value Date/Time   COLORURINE YELLOW (A) 11/04/2020 1542   APPEARANCEUR HAZY (A) 11/04/2020 1542   APPEARANCEUR TURBID 03/03/2015 1638   LABSPEC 1.008 11/04/2020 1542   LABSPEC 1.023 03/03/2015 1638   PHURINE 7.0 11/04/2020 1542   GLUCOSEU 150 (A) 11/04/2020 1542   GLUCOSEU see comment 03/03/2015 1638   HGBUR NEGATIVE 11/04/2020 1542   BILIRUBINUR NEGATIVE 11/04/2020 1542   BILIRUBINUR see comment 03/03/2015 1638   KETONESUR NEGATIVE 11/04/2020 1542   PROTEINUR NEGATIVE 11/04/2020 1542   NITRITE NEGATIVE 11/04/2020 1542   LEUKOCYTESUR LARGE (A) 11/04/2020 1542   LEUKOCYTESUR see comment 03/03/2015 1638   Sepsis Labs: @LABRCNTIP (procalcitonin:4,lacticidven:4) ) Recent Results (from the past 240 hour(s))  Resp Panel by RT-PCR (Flu A&B, Covid) Nasopharyngeal Swab     Status: None   Collection Time: 11/04/20  3:14 PM   Specimen: Nasopharyngeal Swab; Nasopharyngeal(NP) swabs in vial transport medium  Result Value Ref Range Status   SARS Coronavirus 2 by RT PCR NEGATIVE NEGATIVE Final    Comment: (NOTE) SARS-CoV-2 target nucleic acids are NOT DETECTED.  The SARS-CoV-2 RNA is  generally detectable in upper respiratory specimens during the acute phase of infection. The lowest concentration of SARS-CoV-2 viral copies this assay can detect is 138 copies/mL. A negative result does not preclude SARS-Cov-2 infection and should not be used as the sole basis for treatment or other patient management decisions. A negative result may occur with  improper specimen collection/handling, submission of specimen other than nasopharyngeal swab, presence of viral mutation(s) within the areas targeted by this assay, and inadequate number of viral copies(<138 copies/mL). A negative result must be combined with clinical observations, patient history, and epidemiological information. The expected result is Negative.  Fact Sheet for Patients:  EntrepreneurPulse.com.au  Fact Sheet for Healthcare Providers:  IncredibleEmployment.be  This test is no t yet approved or cleared by the [place] FDA and  has been authorized for detection and/or diagnosis of SARS-CoV-2 by FDA under an Emergency Use Authorization (EUA). This EUA will remain  in effect (meaning this test can be used) for the  duration of the COVID-19 declaration under Section 564(b)(1) of the Act, 21 U.S.C.section 360bbb-3(b)(1), unless the authorization is terminated  or revoked sooner.       Influenza A by PCR NEGATIVE NEGATIVE Final   Influenza B by PCR NEGATIVE NEGATIVE Final    Comment: (NOTE) The Xpert Xpress SARS-CoV-2/FLU/RSV plus assay is intended as an aid in the diagnosis of influenza from Nasopharyngeal swab specimens and should not be used as a sole basis for treatment. Nasal washings and aspirates are unacceptable for Xpert Xpress SARS-CoV-2/FLU/RSV testing.  Fact Sheet for Patients: EntrepreneurPulse.com.au  Fact Sheet for Healthcare Providers: IncredibleEmployment.be  This test is not yet approved or cleared by the [place] FDA and has been authorized for detection and/or diagnosis of SARS-CoV-2 by FDA under an Emergency Use Authorization (EUA). This EUA will remain in effect (meaning this test can be used) for the duration of the COVID-19 declaration under Section 564(b)(1) of the Act, 21 U.S.C. section 360bbb-3(b)(1), unless the authorization is terminated or revoked.  Performed at [facility], [address], [place], [place] [address]      Radiological Exams on Admission: CT HEAD CODE STROKE WO CONTRAST  Result Date: 11/04/2020 CLINICAL DATA:  Code stroke. Neuro deficit, acute stroke suspected. Sudden speech in vision issues. EXAM: CT HEAD WITHOUT CONTRAST TECHNIQUE: Contiguous axial images were obtained from the base of the skull through the vertex without intravenous contrast. COMPARISON:  CT head February 01, 2013. FINDINGS: Brain: No evidence of acute large vascular territory infarction, hemorrhage, hydrocephalus, extra-axial collection or mass lesion/mass effect. Patchy white matter hypoattenuation, most likely related to chronic microvascular disease. Mild generalized cerebral volume loss with ex vacuo ventricular dilation. Vascular: No hyperdense vessel identified. Calcific atherosclerosis. Skull: No acute fracture. Sinuses/Orbits: No acute findings. Other: No mastoid effusions. ASPECTS [facility] Stroke Program Early CT Score) total score (0-10 with 10 being normal): 10. IMPRESSION: 1. No evidence of acute intracranial abnormality.ASPECTS is 10. 2. Chronic microvascular ischemic disease and generalized cerebral volume loss. Code stroke imaging results were communicated on 11/04/2020 at 1:40 pm to provider Dr. Charna Archer Via telephone, who verbally acknowledged these results. Electronically Signed   By: Margaretha Sheffield MD   On: 11/04/2020 13:42     EKG:  Not done in ED, will get one.   Assessment/Plan Principal Problem:   Stroke-like symptoms Active Problems:   Hypertension   HTN  (hypertension)   Hypoglycemia   Dementia (HCC)   HLD (hyperlipidemia)   Iron deficiency anemia   CKD (chronic kidney disease), stage IIIa   Stroke-like symptoms: Etiology is not clear. CT head is negative for acute intracranial abnormalities. Patient has hypoglycemia, which may have contributed partially. Dr. Doy Mince is consulted, who recommended to do stroke work-up and get EEG.  - Placed on MedSurg bed for observation - EEG - Obtain MRI of brain  - Check carotid dopplers  - Continue ASA  - fasting lipid panel and HbA1c  - 2D transthoracic echocardiography  - swallowing screen. If fails, will get SLP - PT/OT consult  Hypertension: Patient is not taking medications currently. Blood pressure 147/60 -As needed hydralazine by IV  Depression: -Zoloft, olanzapine  Hypoglycemia: Blood sugar down to 39 in ED. Etiology is known. Patient does not have diabetes, not taking hypoglycemic medications. -Check CBG frequently -On D10-half-normal saline at 100 cc/h -D50 as needed -Check sulfonylurea panel -Check insulin, C-peptide -Check cortisol level ammonia  Dementia (HCC) -Namzaric  HLD (hyperlipidemia) -Tricor  Iron deficiency anemia: Hemoglobin 13.7 -Continue iron  supplement  CKD (chronic kidney disease), stage IIIa: Stable -Follow-up with BMP      DVT ppx: SQ Lovenox Code Status: Full code per her daughter Family Communication:   Yes, patient's  Daughter  at bed side Disposition Plan:  Anticipate discharge back to previous environment Consults called:  Dr. Doy Mince of neurology Admission status: Med-surg bed for obs    Status is: Observation  The patient remains OBS appropriate and will d/c before 2 midnights.  Dispo: The patient is from: Home              Anticipated d/c is to: Home              Anticipated d/c date is: 1 day              Patient currently is not medically stable to d/c.          Date of Service 11/04/2020    [facility]   If 7PM-7AM, please contact night-coverage [email or web address] 11/04/2020, 6:49 PM

## 2020-11-04 NOTE — ED Triage Notes (Addendum)
Pt comes with family with c/o possible seizure activity. Family states pt has been having little episodes of shaking, confusion, sweating and little unresponsive.   Pt denies any pain. Pt speaking in complete sentences. Pt has dementia and at baseline per family.  Family states they were headed to store and then pt  started to have double vision and trouble finding words around 12noon.  CBG-50  Pt taken by MD Charna Archer and assess then taken to CT

## 2020-11-05 ENCOUNTER — Observation Stay: Payer: Medicare HMO

## 2020-11-05 ENCOUNTER — Observation Stay (HOSPITAL_COMMUNITY)
Admit: 2020-11-05 | Discharge: 2020-11-05 | Disposition: A | Discharge status: Home / Self Care | Payer: Medicare HMO | Attending: Internal Medicine | Admitting: Internal Medicine

## 2020-11-05 ENCOUNTER — Inpatient Hospital Stay: Payer: Medicare HMO

## 2020-11-05 DIAGNOSIS — E538 Deficiency of other specified B group vitamins: Secondary | ICD-10-CM | POA: Diagnosis present

## 2020-11-05 DIAGNOSIS — M2550 Pain in unspecified joint: Secondary | ICD-10-CM | POA: Diagnosis not present

## 2020-11-05 DIAGNOSIS — R8281 Pyuria: Secondary | ICD-10-CM | POA: Diagnosis present

## 2020-11-05 DIAGNOSIS — I441 Atrioventricular block, second degree: Secondary | ICD-10-CM | POA: Diagnosis not present

## 2020-11-05 DIAGNOSIS — D509 Iron deficiency anemia, unspecified: Secondary | ICD-10-CM | POA: Diagnosis not present

## 2020-11-05 DIAGNOSIS — L89300 Pressure ulcer of unspecified buttock, unstageable: Secondary | ICD-10-CM | POA: Diagnosis not present

## 2020-11-05 DIAGNOSIS — L89896 Pressure-induced deep tissue damage of other site: Secondary | ICD-10-CM | POA: Diagnosis present

## 2020-11-05 DIAGNOSIS — R7401 Elevation of levels of liver transaminase levels: Secondary | ICD-10-CM | POA: Diagnosis present

## 2020-11-05 DIAGNOSIS — R2990 Unspecified symptoms and signs involving the nervous system: Secondary | ICD-10-CM | POA: Diagnosis not present

## 2020-11-05 DIAGNOSIS — Z743 Need for continuous supervision: Secondary | ICD-10-CM | POA: Diagnosis not present

## 2020-11-05 DIAGNOSIS — R41841 Cognitive communication deficit: Secondary | ICD-10-CM | POA: Diagnosis not present

## 2020-11-05 DIAGNOSIS — Z7401 Bed confinement status: Secondary | ICD-10-CM | POA: Diagnosis not present

## 2020-11-05 DIAGNOSIS — I442 Atrioventricular block, complete: Secondary | ICD-10-CM | POA: Diagnosis not present

## 2020-11-05 DIAGNOSIS — R778 Other specified abnormalities of plasma proteins: Secondary | ICD-10-CM | POA: Diagnosis present

## 2020-11-05 DIAGNOSIS — I4430 Unspecified atrioventricular block: Secondary | ICD-10-CM | POA: Diagnosis not present

## 2020-11-05 DIAGNOSIS — N1832 Chronic kidney disease, stage 3b: Secondary | ICD-10-CM | POA: Diagnosis not present

## 2020-11-05 DIAGNOSIS — E162 Hypoglycemia, unspecified: Secondary | ICD-10-CM | POA: Diagnosis not present

## 2020-11-05 DIAGNOSIS — E7800 Pure hypercholesterolemia, unspecified: Secondary | ICD-10-CM | POA: Diagnosis not present

## 2020-11-05 DIAGNOSIS — N1831 Chronic kidney disease, stage 3a: Secondary | ICD-10-CM | POA: Diagnosis not present

## 2020-11-05 DIAGNOSIS — R4781 Slurred speech: Secondary | ICD-10-CM | POA: Diagnosis present

## 2020-11-05 DIAGNOSIS — J9811 Atelectasis: Secondary | ICD-10-CM | POA: Diagnosis not present

## 2020-11-05 DIAGNOSIS — R4182 Altered mental status, unspecified: Secondary | ICD-10-CM | POA: Diagnosis not present

## 2020-11-05 DIAGNOSIS — M6281 Muscle weakness (generalized): Secondary | ICD-10-CM | POA: Diagnosis not present

## 2020-11-05 DIAGNOSIS — I10 Essential (primary) hypertension: Secondary | ICD-10-CM | POA: Diagnosis not present

## 2020-11-05 DIAGNOSIS — N179 Acute kidney failure, unspecified: Secondary | ICD-10-CM | POA: Diagnosis not present

## 2020-11-05 DIAGNOSIS — I6389 Other cerebral infarction: Secondary | ICD-10-CM | POA: Diagnosis not present

## 2020-11-05 DIAGNOSIS — F0390 Unspecified dementia without behavioral disturbance: Secondary | ICD-10-CM | POA: Diagnosis present

## 2020-11-05 DIAGNOSIS — L89322 Pressure ulcer of left buttock, stage 2: Secondary | ICD-10-CM | POA: Diagnosis present

## 2020-11-05 DIAGNOSIS — E161 Other hypoglycemia: Secondary | ICD-10-CM | POA: Diagnosis not present

## 2020-11-05 DIAGNOSIS — R339 Retention of urine, unspecified: Secondary | ICD-10-CM | POA: Diagnosis not present

## 2020-11-05 DIAGNOSIS — F32A Depression, unspecified: Secondary | ICD-10-CM | POA: Diagnosis present

## 2020-11-05 DIAGNOSIS — D508 Other iron deficiency anemias: Secondary | ICD-10-CM | POA: Diagnosis not present

## 2020-11-05 DIAGNOSIS — I248 Other forms of acute ischemic heart disease: Secondary | ICD-10-CM | POA: Diagnosis not present

## 2020-11-05 DIAGNOSIS — I459 Conduction disorder, unspecified: Secondary | ICD-10-CM | POA: Diagnosis not present

## 2020-11-05 DIAGNOSIS — D500 Iron deficiency anemia secondary to blood loss (chronic): Secondary | ICD-10-CM | POA: Diagnosis not present

## 2020-11-05 DIAGNOSIS — R1311 Dysphagia, oral phase: Secondary | ICD-10-CM | POA: Diagnosis not present

## 2020-11-05 DIAGNOSIS — G4700 Insomnia, unspecified: Secondary | ICD-10-CM | POA: Diagnosis not present

## 2020-11-05 DIAGNOSIS — I129 Hypertensive chronic kidney disease with stage 1 through stage 4 chronic kidney disease, or unspecified chronic kidney disease: Secondary | ICD-10-CM | POA: Diagnosis present

## 2020-11-05 DIAGNOSIS — U071 COVID-19: Secondary | ICD-10-CM | POA: Diagnosis not present

## 2020-11-05 DIAGNOSIS — E871 Hypo-osmolality and hyponatremia: Secondary | ICD-10-CM | POA: Diagnosis not present

## 2020-11-05 DIAGNOSIS — H5461 Unqualified visual loss, right eye, normal vision left eye: Secondary | ICD-10-CM | POA: Diagnosis present

## 2020-11-05 DIAGNOSIS — R69 Illness, unspecified: Secondary | ICD-10-CM | POA: Diagnosis not present

## 2020-11-05 DIAGNOSIS — R262 Difficulty in walking, not elsewhere classified: Secondary | ICD-10-CM | POA: Diagnosis not present

## 2020-11-05 DIAGNOSIS — L89311 Pressure ulcer of right buttock, stage 1: Secondary | ICD-10-CM | POA: Diagnosis present

## 2020-11-05 DIAGNOSIS — R001 Bradycardia, unspecified: Secondary | ICD-10-CM | POA: Diagnosis present

## 2020-11-05 DIAGNOSIS — F0150 Vascular dementia without behavioral disturbance: Secondary | ICD-10-CM | POA: Diagnosis not present

## 2020-11-05 DIAGNOSIS — E785 Hyperlipidemia, unspecified: Secondary | ICD-10-CM | POA: Diagnosis not present

## 2020-11-05 LAB — LIPID PANEL
Cholesterol: 138 mg/dL (ref 0–200)
HDL: 40 mg/dL — ABNORMAL LOW (ref >40)
LDL Cholesterol: 81 mg/dL (ref 0–99)
Total CHOL/HDL Ratio: 3.5 RATIO
Triglycerides: 83 mg/dL (ref <150)
VLDL: 17 mg/dL (ref 0–40)

## 2020-11-05 LAB — GLUCOSE, CAPILLARY
Glucose-Capillary: 173 mg/dL — ABNORMAL HIGH (ref 70–99)
Glucose-Capillary: 46 mg/dL — ABNORMAL LOW (ref 70–99)
Glucose-Capillary: 47 mg/dL — ABNORMAL LOW (ref 70–99)
Glucose-Capillary: 78 mg/dL (ref 70–99)
Glucose-Capillary: 87 mg/dL (ref 70–99)

## 2020-11-05 LAB — ECHOCARDIOGRAM COMPLETE
AR max vel: 1.56 cm2
AV Area VTI: 1.61 cm2
AV Area mean vel: 1.6 cm2
AV Mean grad: 4.7 mmHg
AV Peak grad: 8 mmHg
Ao pk vel: 1.41 m/s
Area-P 1/2: 1.92 cm2
Height: 63 in
S' Lateral: 2.27 cm
Weight: 2777.6 oz

## 2020-11-05 LAB — CORTISOL-AM, BLOOD: Cortisol - AM: 27.9 ug/dL — ABNORMAL HIGH (ref 6.7–22.6)

## 2020-11-05 LAB — HEMOGLOBIN A1C
Hgb A1c MFr Bld: 4.7 % — ABNORMAL LOW (ref 4.8–5.6)
Mean Plasma Glucose: 88.19 mg/dL

## 2020-11-05 MED ORDER — ROSUVASTATIN CALCIUM 10 MG PO TABS
10.0000 mg | ORAL_TABLET | Freq: Every day | ORAL | Status: DC
Start: 2020-11-05 — End: 2020-11-22
  Administered 2020-11-05 – 2020-11-22 (×18): 10 mg via ORAL
  Filled 2020-11-05 (×18): qty 1

## 2020-11-05 MED ORDER — DEXTROSE 10 % IV SOLN: INTRAVENOUS | Status: DC

## 2020-11-05 MED ORDER — GADOBUTROL 1 MMOL/ML IV SOLN
7.0000 mL | Freq: Once | INTRAVENOUS | Status: AC | PRN
  Administered 2020-11-05: 7 mL via INTRAVENOUS

## 2020-11-05 NOTE — Progress Notes (Signed)
PROGRESS NOTE    Alexis Rodriguez  ZTI:[id number] DOB: June 23, 1941 DOA: 11/04/2020 PCP: Perrin Maltese, MD   Brief Narrative: Taken from H&P. Alexis Rodriguez is a 79 y.o. female with medical history significant of hypertension, hyperlipidemia, dementia, iron deficiency anemia, CKD-3, who presents with strokelike symptoms.  Per her daughter, patient has strokelike symptoms for about 1 month. Symptoms have been going on intermittently, including confusion, blurry vision, slurred speech, slow response with staring off. No episode of fully loss of consciousness. No facial droop. Patient moves all extremities. Patient does not have chest pain, cough, shortness breath, fever or chills. No nausea, vomiting, diarrhea, abdominal pain, symptoms of UTI.   He was also found to have hypoglycemia without any prior diagnosis of diabetes or being on any antidiabetic medications. CT head and MRI without any acute infarct.  Continue to drop blood glucose level.  Requiring D10. Neurology was consulted-recommending stroke work-up and EEG.  Subjective: Patient has no new complaints when seen today.  She was oriented to self only and stating I do not know with all of her questions. Per daughter no urinary symptoms, intermittent worsening confusion and sweating for about 1 month.  PCP noticed hypoglycemia and advised some snacks in between meals, which did help initially and then she started dropping her blood glucose again.  Also received 3 day treatment with Cipro for possible UTI.  No fever or chills.  Assessment & Plan:   Principal Problem:   Stroke-like symptoms Active Problems:   HTN (hypertension)   Hypoglycemia   Dementia (HCC)   HLD (hyperlipidemia)   Iron deficiency anemia   CKD (chronic kidney disease), stage IIIa   Depression  Stroke right symptoms/persistent intermittent hypoglycemia.  Imaging is negative for any acute infarct.  Echocardiogram with normal EF and grade 1 diastolic dysfunction.  A1c of  4.7.  EEG was ordered-still pending. Very vague symptoms might be due to intermittent hypoglycemia. Hypoglycemia labs which include sulfonylurea and insulin are pending. Intermittent symptoms for 1 month, where she intermittently seems more confused, staring and sometimes sweating.  Per daughter she is having good p.o. intake and ready consistent with her routine. Patient has underlying dementia and is oriented to self only at baseline. A.m. cortisol mildly elevated. PT is recommending home health services. -Get MRI of pancreas. -Follow-up hypoglycemia labs -Continue with D10. -Need to monitor blood glucose level.  Pyuria.  No urinary symptoms.  Urine cultures are pending.Per daughter received recent 3 day course of cipro.by PCP for concern of UTI. -Follow-up urine cultures.  Hypertension.  She was not on any antihypertensives at home.  Blood pressure mostly in 825K systolic. -Continue to monitor.  Depression.  No acute concern. -Continue home dose of Zoloft and olanzapine.  Dementia ([place]) -Continue Namzaric  HLD (hyperlipidemia) -Continue Tricor  Iron deficiency anemia: Hemoglobin 13.7 -Continue iron supplement  CKD (chronic kidney disease), stage IIIa: Stable -Follow-up with BMP  Objective: Vitals:   11/05/20 0353 11/05/20 0413 11/05/20 0939 11/05/20 1234  BP: (!) 149/74  (!) 158/83 (!) 146/53  Pulse: (!) 57  68 61  Resp: 17  16 18   Temp: 98.3 F (36.8 C)  99 F (37.2 C) (!) 97.1 F (36.2 C)  TempSrc:   Oral Oral  SpO2: 100%  93% 96%  Weight:      Height:  5\' 3"  (1.6 m)     No intake or output data in the 24 hours ending 11/05/20 1507 Filed Weights   11/04/20 1340  Weight: 78.7 kg  Examination:  General exam: Appears calm and comfortable  Respiratory system: Clear to auscultation. Respiratory effort normal. Cardiovascular system: S1 & S2 heard, RRR.  Gastrointestinal system: Soft, nontender, nondistended, bowel sounds positive. Central nervous  system: Alert and oriented to self only.  No focal neurological deficits. Extremities: No edema, no cyanosis, pulses intact and symmetrical. Psychiatry: Judgement and insight appear impaired   DVT prophylaxis: Lovenox Code Status: Full Family Communication: Discussed with daughter on phone. Disposition Plan:  Status is: Inpatient  Remains inpatient appropriate because:Inpatient level of care appropriate due to severity of illness   Dispo: The patient is from: Home              Anticipated d/c is to: Home              Anticipated d/c date is: 1 day              Patient currently is not medically stable to d/c.   Consultants:   Neurology  Procedures:  Antimicrobials:   Data Reviewed: I have personally reviewed following labs and imaging studies  CBC: Recent Labs  Lab 11/04/20 1345  WBC 9.5  NEUTROABS 6.3  HGB 13.7  HCT 41.5  MCV 92.2  PLT 638   Basic Metabolic Panel: Recent Labs  Lab 11/04/20 1345  NA 137  K 4.1  CL 100  CO2 28  GLUCOSE 39*  BUN 14  CREATININE 1.20*  CALCIUM 9.2   GFR: Estimated Creatinine Clearance: 37.1 mL/min (A) (by C-G formula based on SCr of 1.2 mg/dL (H)). Liver Function Tests: Recent Labs  Lab 11/04/20 1345  AST 47*  ALT 25  ALKPHOS 35*  BILITOT 0.5  PROT 7.8  ALBUMIN 3.8   No results for input(s): LIPASE, AMYLASE in the last 168 hours. No results for input(s): AMMONIA in the last 168 hours. Coagulation Profile: Recent Labs  Lab 11/04/20 1345  INR 1.1   Cardiac Enzymes: No results for input(s): CKTOTAL, CKMB, CKMBINDEX, TROPONINI in the last 168 hours. BNP (last 3 results) No results for input(s): PROBNP in the last 8760 hours. HbA1C: Recent Labs    11/05/20 0512  HGBA1C 4.7*   CBG: Recent Labs  Lab 11/04/20 1853 11/04/20 2055 11/05/20 1232 11/05/20 1253 11/05/20 1328  GLUCAP 170* 111* 47* 46* 173*   Lipid Profile: Recent Labs    11/05/20 0512  CHOL 138  HDL 40*  LDLCALC 81  TRIG 83  CHOLHDL  3.5   Thyroid Function Tests: No results for input(s): TSH, T4TOTAL, FREET4, T3FREE, THYROIDAB in the last 72 hours. Anemia Panel: No results for input(s): VITAMINB12, FOLATE, FERRITIN, TIBC, IRON, RETICCTPCT in the last 72 hours. Sepsis Labs: No results for input(s): PROCALCITON, LATICACIDVEN in the last 168 hours.  Recent Results (from the past 240 hour(s))  Resp Panel by RT-PCR (Flu A&B, Covid) Nasopharyngeal Swab     Status: None   Collection Time: 11/04/20  3:14 PM   Specimen: Nasopharyngeal Swab; Nasopharyngeal(NP) swabs in vial transport medium  Result Value Ref Range Status   SARS Coronavirus 2 by RT PCR NEGATIVE NEGATIVE Final    Comment: (NOTE) SARS-CoV-2 target nucleic acids are NOT DETECTED.  The SARS-CoV-2 RNA is generally detectable in upper respiratory specimens during the acute phase of infection. The lowest concentration of SARS-CoV-2 viral copies this assay can detect is 138 copies/mL. A negative result does not preclude SARS-Cov-2 infection and should not be used as the sole basis for treatment or other patient management decisions. A negative result  may occur with  improper specimen collection/handling, submission of specimen other than nasopharyngeal swab, presence of viral mutation(s) within the areas targeted by this assay, and inadequate number of viral copies(<138 copies/mL). A negative result must be combined with clinical observations, patient history, and epidemiological information. The expected result is Negative.  Fact Sheet for Patients:  EntrepreneurPulse.com.au  Fact Sheet for Healthcare Providers:  IncredibleEmployment.be  This test is no t yet approved or cleared by the [place] FDA and  has been authorized for detection and/or diagnosis of SARS-CoV-2 by FDA under an Emergency Use Authorization (EUA). This EUA will remain  in effect (meaning this test can be used) for the duration of the COVID-19  declaration under Section 564(b)(1) of the Act, 21 U.S.C.section 360bbb-3(b)(1), unless the authorization is terminated  or revoked sooner.       Influenza A by PCR NEGATIVE NEGATIVE Final   Influenza B by PCR NEGATIVE NEGATIVE Final    Comment: (NOTE) The Xpert Xpress SARS-CoV-2/FLU/RSV plus assay is intended as an aid in the diagnosis of influenza from Nasopharyngeal swab specimens and should not be used as a sole basis for treatment. Nasal washings and aspirates are unacceptable for Xpert Xpress SARS-CoV-2/FLU/RSV testing.  Fact Sheet for Patients: EntrepreneurPulse.com.au  Fact Sheet for Healthcare Providers: IncredibleEmployment.be  This test is not yet approved or cleared by the [place] FDA and has been authorized for detection and/or diagnosis of SARS-CoV-2 by FDA under an Emergency Use Authorization (EUA). This EUA will remain in effect (meaning this test can be used) for the duration of the COVID-19 declaration under Section 564(b)(1) of the Act, 21 U.S.C. section 360bbb-3(b)(1), unless the authorization is terminated or revoked.  Performed at [facility], [address] [place], [place] [address]      Radiology Studies: MR BRAIN WO CONTRAST  Result Date: 11/04/2020 CLINICAL DATA:  Initial evaluation for possible stroke. EXAM: MRI HEAD WITHOUT CONTRAST TECHNIQUE: Multiplanar, multiecho pulse sequences of the brain and surrounding structures were obtained without intravenous contrast. COMPARISON:  Prior CT from earlier same day. FINDINGS: Brain: Moderately advanced temporal lobe predominant cerebral atrophy. Patchy and confluent T2/FLAIR hyperintensity involving the periventricular deep white matter both cerebral hemispheres most likely related chronic small vessel ischemic disease, moderate in nature. No abnormal foci of restricted diffusion to suggest acute or subacute ischemia. Gray-white matter differentiation  maintained. No encephalomalacia to suggest chronic cortical infarction. Tiny remote lacunar infarct present at the left thalamus. No evidence for acute or chronic intracranial hemorrhage. No mass lesion, midline shift or mass effect. No hydrocephalus or extra-axial fluid collection. Incidental note made of a partially empty sella. Midline structures intact. Vascular: Major intracranial vascular flow voids are grossly maintained. Skull and upper cervical spine: Craniocervical junction within normal limits. Bone marrow signal intensity normal. No scalp soft tissue abnormality. Hyperostosis frontalis interna noted. Sinuses/Orbits: Globes and orbital soft tissues within normal limits. Paranasal sinuses are clear. No significant mastoid effusion. Inner ear structures grossly normal. Other: None. IMPRESSION: 1. No acute intracranial abnormality. 2. Moderately advanced temporal lobe predominant cerebral atrophy with chronic small vessel ischemic disease. Electronically Signed   By: Jeannine Boga M.D.   On: 11/04/2020 22:12   ECHOCARDIOGRAM COMPLETE  Result Date: 11/05/2020    ECHOCARDIOGRAM REPORT   Patient Name:   Alexis Rodriguez Date of Exam: 11/05/2020 Medical Rec #:  [id number]  Height:       63.0 in Accession #:    [id number] Weight:       173.6 lb  Date of Birth:  February 03, 1941  BSA:          1.821 m Patient Age:    22 years   BP:           158/83 mmHg Patient Gender: F          HR:           68 bpm. Exam Location:  [facility] Procedure: 2D Echo, Cardiac Doppler and Color Doppler Indications:     Stroke 434.91  History:         Patient has no prior history of Echocardiogram examinations.                  Risk Factors:Hypertension. Dementia.  Sonographer:     Sherrie Sport RDCS (AE) Referring Phys:  [id number] Soledad Gerlach NIU Diagnosing Phys: Kate Sable MD  Sonographer Comments: Suboptimal apical window. IMPRESSIONS  1. Left ventricular ejection fraction, by estimation, is 60 to 65%. The left ventricle has normal function. The  left ventricle has no regional wall motion abnormalities. Left ventricular diastolic parameters are consistent with Grade I diastolic dysfunction (impaired relaxation).  2. Right ventricular systolic function is normal. The right ventricular size is normal.  3. The mitral valve is normal in structure. No evidence of mitral valve regurgitation.  4. The aortic valve is tricuspid. Aortic valve regurgitation is not visualized. Mild aortic valve sclerosis is present, with no evidence of aortic valve stenosis.  5. Echogenic structure noted in the posterior aorta (clips 2,3,7) this could be artifactual vs supravalvular membrane. Conclusion(s)/Recommendation(s): No intracardiac source of embolism detected on this transthoracic study. A transesophageal echocardiogram is recommended to exclude cardiac source of embolism if clinically indicated. FINDINGS  Left Ventricle: Left ventricular ejection fraction, by estimation, is 60 to 65%. The left ventricle has normal function. The left ventricle has no regional wall motion abnormalities. The left ventricular internal cavity size was normal in size. There is  no left ventricular hypertrophy. Left ventricular diastolic parameters are consistent with Grade I diastolic dysfunction (impaired relaxation). Right Ventricle: The right ventricular size is normal. No increase in right ventricular wall thickness. Right ventricular systolic function is normal. Left Atrium: Left atrial size was normal in size. Right Atrium: Right atrial size was normal in size. Pericardium: There is no evidence of pericardial effusion. Mitral Valve: The mitral valve is normal in structure. No evidence of mitral valve regurgitation. Tricuspid Valve: The tricuspid valve is normal in structure. Tricuspid valve regurgitation is not demonstrated. Aortic Valve: The aortic valve is tricuspid. Aortic valve regurgitation is not visualized. Mild aortic valve sclerosis is present, with no evidence of aortic valve  stenosis. Aortic valve mean gradient measures 4.7 mmHg. Aortic valve peak gradient measures 8.0 mmHg. Aortic valve area, by VTI measures 1.61 cm. Pulmonic Valve: The pulmonic valve was normal in structure. Pulmonic valve regurgitation is not visualized. Aorta: Echogenic structure noted in the posterior aorta (clips 2,3,7) this could be artifactual vs supravalvular membrane. The aortic root is normal in size and structure. Venous: The inferior vena cava was not well visualized. IAS/Shunts: No atrial level shunt detected by color flow Doppler.  LEFT VENTRICLE PLAX 2D LVIDd:         3.95 cm  Diastology LVIDs:         2.27 cm  LV e' medial:    4.03 cm/s LV PW:         1.13 cm  LV E/e' medial:  17.6 LV IVS:        0.88  cm  LV e' lateral:   4.79 cm/s LVOT diam:     2.00 cm  LV E/e' lateral: 14.8 LV SV:         48 LV SV Index:   26 LVOT Area:     3.14 cm  LEFT ATRIUM             Index       RIGHT ATRIUM          Index LA diam:        3.50 cm 1.92 cm/m  RA Area:     9.69 cm LA Vol (A2C):   22.9 ml 12.58 ml/m RA Volume:   19.00 ml 10.44 ml/m LA Vol (A4C):   23.7 ml 13.02 ml/m LA Biplane Vol: 24.0 ml 13.18 ml/m  AORTIC VALVE                   PULMONIC VALVE AV Area (Vmax):    1.56 cm    PV Vmax:        0.79 m/s AV Area (Vmean):   1.60 cm    PV Peak grad:   2.5 mmHg AV Area (VTI):     1.61 cm    RVOT Peak grad: 4 mmHg AV Vmax:           141.33 cm/s AV Vmean:          99.033 cm/s AV VTI:            0.297 m AV Peak Grad:      8.0 mmHg AV Mean Grad:      4.7 mmHg LVOT Vmax:         70.20 cm/s LVOT Vmean:        50.400 cm/s LVOT VTI:          0.152 m LVOT/AV VTI ratio: 0.51  AORTA Ao Root diam: 2.90 cm MITRAL VALVE MV Area (PHT): 1.92 cm     SHUNTS MV Decel Time: 396 msec     Systemic VTI:  0.15 m MV E velocity: 70.80 cm/s   Systemic Diam: 2.00 cm MV A velocity: 113.00 cm/s MV E/A ratio:  0.63 Kate Sable MD Electronically signed by Kate Sable MD Signature Date/Time: 11/05/2020/2:41:47 PM    Final    CT  HEAD CODE STROKE WO CONTRAST  Result Date: 11/04/2020 CLINICAL DATA:  Code stroke. Neuro deficit, acute stroke suspected. Sudden speech in vision issues. EXAM: CT HEAD WITHOUT CONTRAST TECHNIQUE: Contiguous axial images were obtained from the base of the skull through the vertex without intravenous contrast. COMPARISON:  CT head February 01, 2013. FINDINGS: Brain: No evidence of acute large vascular territory infarction, hemorrhage, hydrocephalus, extra-axial collection or mass lesion/mass effect. Patchy white matter hypoattenuation, most likely related to chronic microvascular disease. Mild generalized cerebral volume loss with ex vacuo ventricular dilation. Vascular: No hyperdense vessel identified. Calcific atherosclerosis. Skull: No acute fracture. Sinuses/Orbits: No acute findings. Other: No mastoid effusions. ASPECTS [facility] Stroke Program Early CT Score) total score (0-10 with 10 being normal): 10. IMPRESSION: 1. No evidence of acute intracranial abnormality.ASPECTS is 10. 2. Chronic microvascular ischemic disease and generalized cerebral volume loss. Code stroke imaging results were communicated on 11/04/2020 at 1:40 pm to provider Dr. Charna Archer Via telephone, who verbally acknowledged these results. Electronically Signed   By: Margaretha Sheffield MD   On: 11/04/2020 13:42    Scheduled Meds: . aspirin EC  81 mg Oral Daily  . memantine  28 mg Oral QHS   And  .  donepezil  10 mg Oral QHS  . enoxaparin (LOVENOX) injection  40 mg Subcutaneous Q2200  . fenofibrate  160 mg Oral Daily  . ferrous sulfate  325 mg Oral Daily  . magnesium gluconate  250 mg Oral Daily  . melatonin  5 mg Oral QHS  . OLANZapine  5 mg Oral QHS  . rosuvastatin  10 mg Oral Daily  . sertraline  25 mg Oral Daily   Continuous Infusions: . dextrose 75 mL/hr at 11/05/20 1255     LOS: 0 days   Time spent: 40 minutes.  Lorella Nimrod, MD [facility]  If 7PM-7AM, please contact night-coverage [email or web address]  11/05/2020,  3:07 PM   This record has been created using Systems analyst. Errors have been sought and corrected,but may not always be located. Such creation errors do not reflect on the standard of care.

## 2020-11-05 NOTE — Progress Notes (Signed)
Subjective: Patient awake and alert.  Communicative.  Working with therapy.    Objective: Current vital signs: BP (!) 158/83 (BP Location: Left Arm)   Pulse 68   Temp 99 F (37.2 C) (Oral)   Resp 16   Ht 5\' 3"  (1.6 m)   Wt 78.7 kg   SpO2 93%   BMI 30.75 kg/m  Vital signs in last 24 hours: Temp:  [98 F (36.7 C)-99 F (37.2 C)] 99 F (37.2 C) (12/22 0939) Pulse Rate:  [55-73] 68 (12/22 0939) Resp:  [13-23] 16 (12/22 0939) BP: (128-180)/(54-103) 158/83 (12/22 0939) SpO2:  [93 %-100 %] 93 % (12/22 0939) Weight:  [78.7 kg] 78.7 kg (12/21 1340)  Intake/Output from previous day: 12/21 0701 - 12/22 0700 In: 3 [I.V.:3] Out: -  Intake/Output this shift: No intake/output data recorded. Nutritional status:  Diet Order            Diet Heart Room service appropriate? Yes; Fluid consistency: Thin  Diet effective now                 Neurologic Exam: Mental Status: Alert.  Speech fluent.  Able to follow commands.   Cranial Nerves: II: Blind in the right eye.  Visual fields grossly normal in the left eye. III,IV, VI: ptosis not present, extra-ocular motions intact bilaterally V,VII: smile symmetric, facial light touch sensation normal bilaterally VIII: hearing normal bilaterally IX,X: gag reflex present XI: bilateral shoulder shrug XII: midline tongue extension Motor: Moves all extremities against gravity Sensory: Pinprick and light touch intact throughout, bilaterally  Lab Results: Basic Metabolic Panel: Recent Labs  Lab 11/04/20 1345  NA 137  K 4.1  CL 100  CO2 28  GLUCOSE 39*  BUN 14  CREATININE 1.20*  CALCIUM 9.2    Liver Function Tests: Recent Labs  Lab 11/04/20 1345  AST 47*  ALT 25  ALKPHOS 35*  BILITOT 0.5  PROT 7.8  ALBUMIN 3.8   No results for input(s): LIPASE, AMYLASE in the last 168 hours. No results for input(s): AMMONIA in the last 168 hours.  CBC: Recent Labs  Lab 11/04/20 1345  WBC 9.5  NEUTROABS 6.3  HGB 13.7  HCT 41.5  MCV  92.2  PLT 306    Cardiac Enzymes: No results for input(s): CKTOTAL, CKMB, CKMBINDEX, TROPONINI in the last 168 hours.  Lipid Panel: Recent Labs  Lab 11/05/20 0512  CHOL 138  TRIG 83  HDL 40*  CHOLHDL 3.5  VLDL 17  LDLCALC 81    CBG: Recent Labs  Lab 11/04/20 1505 11/04/20 1617 11/04/20 1719 11/04/20 1853 11/04/20 2055  GLUCAP 32* 106* 106* 170* 111*    Microbiology: Results for orders placed or performed during the hospital encounter of 11/04/20  Resp Panel by RT-PCR (Flu A&B, Covid) Nasopharyngeal Swab     Status: None   Collection Time: 11/04/20  3:14 PM   Specimen: Nasopharyngeal Swab; Nasopharyngeal(NP) swabs in vial transport medium  Result Value Ref Range Status   SARS Coronavirus 2 by RT PCR NEGATIVE NEGATIVE Final    Comment: (NOTE) SARS-CoV-2 target nucleic acids are NOT DETECTED.  The SARS-CoV-2 RNA is generally detectable in upper respiratory specimens during the acute phase of infection. The lowest concentration of SARS-CoV-2 viral copies this assay can detect is 138 copies/mL. A negative result does not preclude SARS-Cov-2 infection and should not be used as the sole basis for treatment or other patient management decisions. A negative result may occur with  improper specimen collection/handling, submission of  specimen other than nasopharyngeal swab, presence of viral mutation(s) within the areas targeted by this assay, and inadequate number of viral copies(<138 copies/mL). A negative result must be combined with clinical observations, patient history, and epidemiological information. The expected result is Negative.  Fact Sheet for Patients:  EntrepreneurPulse.com.au  Fact Sheet for Healthcare Providers:  IncredibleEmployment.be  This test is no t yet approved or cleared by the [place] FDA and  has been authorized for detection and/or diagnosis of SARS-CoV-2 by FDA under an Emergency Use Authorization  (EUA). This EUA will remain  in effect (meaning this test can be used) for the duration of the COVID-19 declaration under Section 564(b)(1) of the Act, 21 U.S.C.section 360bbb-3(b)(1), unless the authorization is terminated  or revoked sooner.       Influenza A by PCR NEGATIVE NEGATIVE Final   Influenza B by PCR NEGATIVE NEGATIVE Final    Comment: (NOTE) The Xpert Xpress SARS-CoV-2/FLU/RSV plus assay is intended as an aid in the diagnosis of influenza from Nasopharyngeal swab specimens and should not be used as a sole basis for treatment. Nasal washings and aspirates are unacceptable for Xpert Xpress SARS-CoV-2/FLU/RSV testing.  Fact Sheet for Patients: EntrepreneurPulse.com.au  Fact Sheet for Healthcare Providers: IncredibleEmployment.be  This test is not yet approved or cleared by the [place] FDA and has been authorized for detection and/or diagnosis of SARS-CoV-2 by FDA under an Emergency Use Authorization (EUA). This EUA will remain in effect (meaning this test can be used) for the duration of the COVID-19 declaration under Section 564(b)(1) of the Act, 21 U.S.C. section 360bbb-3(b)(1), unless the authorization is terminated or revoked.  Performed at [facility], [address] [place], [place] [address]     Coagulation Studies: Recent Labs    11/04/20 1345  LABPROT 13.9  INR 1.1    Imaging: MR BRAIN WO CONTRAST  Result Date: 11/04/2020 CLINICAL DATA:  Initial evaluation for possible stroke. EXAM: MRI HEAD WITHOUT CONTRAST TECHNIQUE: Multiplanar, multiecho pulse sequences of the brain and surrounding structures were obtained without intravenous contrast. COMPARISON:  Prior CT from earlier same day. FINDINGS: Brain: Moderately advanced temporal lobe predominant cerebral atrophy. Patchy and confluent T2/FLAIR hyperintensity involving the periventricular deep white matter both cerebral hemispheres most likely  related chronic small vessel ischemic disease, moderate in nature. No abnormal foci of restricted diffusion to suggest acute or subacute ischemia. Gray-white matter differentiation maintained. No encephalomalacia to suggest chronic cortical infarction. Tiny remote lacunar infarct present at the left thalamus. No evidence for acute or chronic intracranial hemorrhage. No mass lesion, midline shift or mass effect. No hydrocephalus or extra-axial fluid collection. Incidental note made of a partially empty sella. Midline structures intact. Vascular: Major intracranial vascular flow voids are grossly maintained. Skull and upper cervical spine: Craniocervical junction within normal limits. Bone marrow signal intensity normal. No scalp soft tissue abnormality. Hyperostosis frontalis interna noted. Sinuses/Orbits: Globes and orbital soft tissues within normal limits. Paranasal sinuses are clear. No significant mastoid effusion. Inner ear structures grossly normal. Other: None. IMPRESSION: 1. No acute intracranial abnormality. 2. Moderately advanced temporal lobe predominant cerebral atrophy with chronic small vessel ischemic disease. Electronically Signed   By: Jeannine Boga M.D.   On: 11/04/2020 22:12   CT HEAD CODE STROKE WO CONTRAST  Result Date: 11/04/2020 CLINICAL DATA:  Code stroke. Neuro deficit, acute stroke suspected. Sudden speech in vision issues. EXAM: CT HEAD WITHOUT CONTRAST TECHNIQUE: Contiguous axial images were obtained from the base of the skull through the vertex without  intravenous contrast. COMPARISON:  CT head February 01, 2013. FINDINGS: Brain: No evidence of acute large vascular territory infarction, hemorrhage, hydrocephalus, extra-axial collection or mass lesion/mass effect. Patchy white matter hypoattenuation, most likely related to chronic microvascular disease. Mild generalized cerebral volume loss with ex vacuo ventricular dilation. Vascular: No hyperdense vessel identified. Calcific  atherosclerosis. Skull: No acute fracture. Sinuses/Orbits: No acute findings. Other: No mastoid effusions. ASPECTS [facility] Stroke Program Early CT Score) total score (0-10 with 10 being normal): 10. IMPRESSION: 1. No evidence of acute intracranial abnormality.ASPECTS is 10. 2. Chronic microvascular ischemic disease and generalized cerebral volume loss. Code stroke imaging results were communicated on 11/04/2020 at 1:40 pm to provider Dr. Charna Archer Via telephone, who verbally acknowledged these results. Electronically Signed   By: Margaretha Sheffield MD   On: 11/04/2020 13:42    Medications:  I have reviewed the patient's current medications. Scheduled: . aspirin EC  81 mg Oral Daily  . memantine  28 mg Oral QHS   And  . donepezil  10 mg Oral QHS  . enoxaparin (LOVENOX) injection  40 mg Subcutaneous Q2200  . fenofibrate  160 mg Oral Daily  . ferrous sulfate  325 mg Oral Daily  . magnesium gluconate  250 mg Oral Daily  . melatonin  5 mg Oral QHS  . OLANZapine  5 mg Oral QHS  . rosuvastatin  10 mg Oral Daily  . sertraline  25 mg Oral Daily    Assessment/Plan: 79 y.o. female with a history of concussion and CVA with resultant right eye blindness, dementia, B12 deficiency and HTN who presents after an episode of poor responsiveness and loss of vision.  Patient's first episode was about a month ago.  Describes episode as patient becoming poorly responsive.  At times will rub her face or legs.  Speech is either not present or stuttering.  Today also described loss of vision in her left eye.  Has been noted to have low blood sugar at this time.  On other occasions has been noted to have low blood pressure.  Has had about 4 since her initial one about a month ago.  Had an episode yesterday precipitating admission.   MRI of the personally reviewed and shows no acute changes. EEG, echocardiogram and carotid dopplers are pending.  A1c 4.7, LDL 81.  Patient on ASA.   Glucose went to a low of 32.  Last CBG 111.   Complains of some dizziness with standing and found to be orthostatic.  Presentation likely secondary to a combination of these two factors-hypoglycemia and orthostasis.  Recommendations: 1. Agree with treatment of orthostasis and hypoglycemia.   No further neurologic intervention is recommended at this time.  If further questions arise, please call or page at that time.  Thank you for allowing neurology to participate in the care of this patient.    LOS: 0 days   Alexis Goodell, MD Neurology  11/05/2020  12:24 PM

## 2020-11-05 NOTE — Evaluation (Signed)
Physical Therapy Evaluation Patient Details Name: Alexis Rodriguez MRN: [id number] DOB: 02/03/1941 Today's Date: 11/05/2020   History of Present Illness  presented to ER secondary to AMS, periods of unresponsiveness, questionable seizure-like activity; admitted for TIA/CVA work up.  MRI negative for acute insult.  Clinical Impression  Patient sitting edge of bed, participating with OT evaluation upon arrival to room.  Alert and oriented to self only; following simple commands (increased time for processing, response and task initiation).  Maintains static/dynamic sitting balance with close sup, but does display slight posterior listing with divided attention/dual task activities.  Very limited functional reach evident.  Bilat UE/LE strength and ROM grossly symmetrical and WFL for basic transfers and gait; generally slow to initiate movement, but no focal weakness appreciated.  Currently requiring min/mod assist for bed mobility; min assist +1-2 for sit/stand, basic transfers and gait (5' laterally edge of bed) with RW.  Demonstrates lateral stepping edge of bed, very short shuffling steps; limited balance reactions evident.  Do recommend +1 physical assist and continued use of RW at all times.  Additional gait, OOB activity deferred per request of neurology due to recent history of unresponsive episodes.  Will continue to assess/progress as medically appropriate. Of note, orthostatics assessed during session; see vitals flowsheet for details.  Does demonstrate approx 30 point drop in SBP from sitting to standing; symptomatic with transition to upright.  RN informed/aware. Would benefit from skilled PT to address above deficits and promote optimal return to PLOF.; Recommend transition to HHPT upon discharge from acute hospitalization.     Follow Up Recommendations Home health PT    Equipment Recommendations  Rolling walker with 5" wheels;3in1 (PT)    Recommendations for Other Services        Precautions / Restrictions Precautions Precautions: Fall Restrictions Weight Bearing Restrictions: No      Mobility  Bed Mobility Overal bed mobility: Needs Assistance Bed Mobility: Supine to Sit;Sit to Supine     Supine to sit: Min assist;Mod assist Sit to supine: Supervision   General bed mobility comments: increased time required to complete; limited dissociation of trunk/extremities    Transfers Overall transfer level: Needs assistance Equipment used: Rolling walker (2 wheeled) Transfers: Sit to/from Stand Sit to Stand: Min assist;+2 safety/equipment         General transfer comment: tends to pull on RW with sit/stand  Ambulation/Gait Ambulation/Gait assistance: Min assist Gait Distance (Feet): 5 Feet Assistive device: Rolling walker (2 wheeled)       General Gait Details: lateral stepping edge of bed, very short shuffling steps; limited balance reactions evident.  Do recommend +1 physical assist and continued use of RW at all times  Stairs            Wheelchair Mobility    Modified Rankin (Stroke Patients Only)       Balance Overall balance assessment: Needs assistance Sitting-balance support: Feet supported;No upper extremity supported Sitting balance-Leahy Scale: Good     Standing balance support: Bilateral upper extremity supported Standing balance-Leahy Scale: Poor                               Pertinent Vitals/Pain Pain Assessment: No/denies pain    Home Living Family/patient expects to be discharged to:: Private residence Living Arrangements: Children Available Help at Discharge: Family (Per patient, daughter works outside of the home; alone while daughter working?) Type of Home: House Home Access: Level entry     Home  Layout: One level Home Equipment: Walker - 2 wheels Additional Comments: Patient limited historian; will verify with family as available.    Prior Function           Comments: Patient limited  historian; will verify with family as available.  Patient indicates ambulatory with RW for household distances; denies recent fall history.     Hand Dominance        Extremity/Trunk Assessment   Upper Extremity Assessment Upper Extremity Assessment: Overall WFL for tasks assessed (grossly at least 4-/5 throughout)    Lower Extremity Assessment Lower Extremity Assessment: Overall WFL for tasks assessed (grossly at least 4-/5 throughout; supports body weight in standing without buckling)       Communication   Communication: No difficulties (increased time for processing/response)  Cognition Arousal/Alertness: Awake/alert Behavior During Therapy: Flat affect Overall Cognitive Status: No family/caregiver present to determine baseline cognitive functioning                                 General Comments: Alert and oriented to self only; unaware of location, situation or date.  Follows simple commands (with increased time for processing, response and task initiation); pleasant and cooperative throughout      General Comments      Exercises Other Exercises Other Exercises: Sit/stand x3 with RW, min assist +1-2 for safety.  Cuing for hand placement, but limited carry-over between reps.  Min assist +1 for static standing balance; mildly tremulous at times, delayed balance/righting reactions evident. Other Exercises: Orthostatic assessment-see vitals flowsheet for details.  Does demonstrate approx 30 point drop in SBP from sitting to standing; symptomatic with transition to upright.  RN informed/aware.   Assessment/Plan    PT Assessment Patient needs continued PT services  PT Problem List Decreased strength;Decreased activity tolerance;Decreased balance;Decreased mobility;Decreased coordination;Decreased cognition;Decreased knowledge of use of DME;Decreased safety awareness;Decreased knowledge of precautions       PT Treatment Interventions DME instruction;Gait  training;Functional mobility training;Therapeutic activities;Therapeutic exercise;Balance training;Neuromuscular re-education;Patient/family education;Cognitive remediation    PT Goals (Current goals can be found in the Care Plan section)  Acute Rehab PT Goals Patient Stated Goal: Agreeable to participation with session PT Goal Formulation: Patient unable to participate in goal setting Time For Goal Achievement: 11/19/20 Potential to Achieve Goals: Good    Frequency Min 2X/week   Barriers to discharge        Co-evaluation PT/OT/SLP Co-Evaluation/Treatment: Yes Reason for Co-Treatment: For patient/therapist safety;To address functional/ADL transfers;Necessary to address cognition/behavior during functional activity PT goals addressed during session: Mobility/safety with mobility OT goals addressed during session: ADL's and self-care       AM-PAC PT "6 Clicks" Mobility  Outcome Measure Help needed turning from your back to your side while in a flat bed without using bedrails?: A Little Help needed moving from lying on your back to sitting on the side of a flat bed without using bedrails?: A Lot Help needed moving to and from a bed to a chair (including a wheelchair)?: A Little Help needed standing up from a chair using your arms (e.g., wheelchair or bedside chair)?: A Little Help needed to walk in hospital room?: A Little Help needed climbing 3-5 steps with a railing? : A Lot 6 Click Score: 16    End of Session Equipment Utilized During Treatment: Gait belt Activity Tolerance: Patient tolerated treatment well Patient left: in bed;with call bell/phone within reach;with bed alarm set (chair position in bed)  Nurse Communication: Mobility status PT Visit Diagnosis: Muscle weakness (generalized) (M62.81);Difficulty in walking, not elsewhere classified (R26.2)    Time: 7425-5258 PT Time Calculation (min) (ACUTE ONLY): 24 min   Charges:   PT Evaluation $PT Eval Moderate  Complexity: 1 Mod PT Treatments $Therapeutic Activity: 8-22 mins        Blakelee Allington H. Owens Shark, PT, DPT, NCS 11/05/20, 10:26 AM [phone]

## 2020-11-05 NOTE — TOC Initial Note (Addendum)
Transition of Care [facility]) - Initial/Assessment Note    Patient Details  Name: Alexis Rodriguez MRN: [id number] Date of Birth: [birth year]  [facility] CM/SW Contact:    Alexis Ivan, Alexis Rodriguez Phone Number: 11/05/2020, 2:41 PM  Clinical Narrative:          CSW confirmed with RN that patient is still disoriented. Called and spoke with daughter Alexis Rodriguez. She reported patient lives with her, her husband, and 2 children. PCP is [facility]. [facility]acy [facility] [place]. Patient has a cane and RW. Patient used [facility] in the past but it was many years ago and she could not recall which agency. No SNF history. Confirmed home address listed in chart. Explained PT/OT recommendations for Home Health and 3 in 1. Alexis Rodriguez is agreeable to both recommendations, denied having an agency preference. [facility] unable to see until January 3 at the earliest. [facility] accepted patient, can see Tuesday December 28 at the earliest, informed patient's daughter who said she is fine with this. 3 in 1 referral made to [facility].         Expected Discharge Plan: [place] Barriers to Discharge: Continued Medical Work up   Patient Goals and CMS Choice Patient states their goals for this hospitalization and ongoing recovery are:: home with home health CMS Medicare.gov Compare Post Acute Care list provided to:: Patient Represenative (must comment) Choice offered to / list presented to : Adult [place] / Guardian  Expected Discharge Plan and Services Expected Discharge Plan: [place]       Living arrangements for the past 2 months: [place]                 DME Arranged: 3-N-1 DME Agency: [facility] Date DME Agency Contacted: 11/05/20   Representative spoke with at DME Agency: [place]: PT,OT          Prior Living Arrangements/Services Living arrangements for the past 2 months: [place] Lives with:: Adult Children,Relatives Patient language and need for interpreter reviewed:: Yes Do you feel safe going back to the place where you live?: Yes      Need for Family Participation in Patient Care: Yes (Comment) Care giver support system in place?: Yes (comment) Current home services: DME Criminal Activity/Legal Involvement Pertinent to Current Situation/Hospitalization: No - Comment as needed  Activities of Daily Living Home Assistive Devices/Equipment: None ADL Screening (condition at time of admission) Patient's cognitive ability adequate to safely complete daily activities?: No Is the patient deaf or have difficulty hearing?: No Does the patient have difficulty seeing, even when wearing glasses/contacts?: No Does the patient have difficulty concentrating, remembering, or making decisions?: Yes Patient able to express need for assistance with ADLs?: Yes Does the patient have difficulty dressing or bathing?: Yes Independently performs ADLs?: No Does the patient have difficulty walking or climbing stairs?: Yes Weakness of Legs: Both Weakness of Arms/Hands: None  Permission Sought/Granted Permission sought to share information with : Chartered certified accountant granted to share information with : Yes, Verbal Permission Granted     Permission granted to share info w AGENCY: [facility], DME agencies        Emotional Assessment       Orientation: : Fluctuating Orientation (Suspected and/or reported Sundowners) Alcohol / Substance Use: Not Applicable Psych Involvement: No (comment)  Admission diagnosis:  Hypoglycemia [E16.2] Stroke-like symptoms [R29.90] Altered mental status, unspecified altered mental status  type [R41.82] Patient Active Problem List   Diagnosis Date Noted  . Stroke-like symptoms 11/04/2020  . Depression 11/04/2020  . HTN (hypertension)   . Hypoglycemia   . Dementia ([place])   . HLD (hyperlipidemia)   . Iron deficiency anemia   .  CKD (chronic kidney disease), stage IIIa    PCP:  Perrin Maltese, MD Pharmacy:  No Pharmacies Listed    Social Determinants of Health (SDOH) Interventions    Readmission Risk Interventions No flowsheet data found.

## 2020-11-05 NOTE — Progress Notes (Signed)
*  PRELIMINARY RESULTS* Echocardiogram 2D Echocardiogram has been performed.  Alexis Rodriguez 11/05/2020, 11:16 AM

## 2020-11-05 NOTE — Evaluation (Signed)
Occupational Therapy Evaluation Patient Details Name: Alexis Rodriguez MRN: [id number] DOB: 06/25/1941 Today's Date: 11/05/2020    History of Present Illness presented to ER secondary to AMS, periods of unresponsiveness, questionable seizure-like activity; admitted for TIA/CVA work up.  MRI negative for acute insult.   Clinical Impression   Alexis Rodriguez is able to provide her name, but cannot state where she is or name the day, month, season, or year. She can also provide few details of her living situation, other than saying that she lives with her daughter. She denies fall hx. Alexis Rodriguez is pleasant, willing to participate in session, but displays flat affect. MinA for supine<sit, with greatly increased time required. Poor standing balance. Pt reports no pain or dizziness, although demonstrates ~ 30 pt drop in SBP when transitioning from sit<stand. This therapist is concerned about safety of pt if she is home alone. Recommend HHOT and other [facility] services post-DC, with increased non-therapeutic supervision as possible.     Follow Up Recommendations  Home health OT    Equipment Recommendations  Other (comment) (unclear what equipment pt has at home at present)    Recommendations for Other Services       Precautions / Restrictions Precautions Precautions: Fall Restrictions Weight Bearing Restrictions: No      Mobility Bed Mobility Overal bed mobility: Needs Assistance Bed Mobility: Supine to Sit;Sit to Supine     Supine to sit: Min assist Sit to supine: Supervision   General bed mobility comments: increased time required to complete; limited dissociation of trunk/extremities    Transfers Overall transfer level: Needs assistance Equipment used: Rolling walker (2 wheeled) Transfers: Sit to/from Stand Sit to Stand: Min assist;+2 safety/equipment         General transfer comment: tends to pull on RW with sit/stand    Balance Overall balance assessment: Needs  assistance Sitting-balance support: Feet supported;No upper extremity supported Sitting balance-Leahy Scale: Good     Standing balance support: Bilateral upper extremity supported Standing balance-Leahy Scale: Poor                             ADL either performed or assessed with clinical judgement   ADL Overall ADL's : Needs assistance/impaired Eating/Feeding: Independent Eating/Feeding Details (indicate cue type and reason): Pt just finishing breakfast as therapist came into room.                 Lower Body Dressing: Maximal assistance Lower Body Dressing Details (indicate cue type and reason): Pt unable to assist in donning socks -- cannot bend to reach feet, cannot flex knee to bring foot up                     Vision Patient Visual Report: No change from baseline       Perception     Praxis      Pertinent Vitals/Pain Pain Assessment: No/denies pain     Hand Dominance     Extremity/Trunk Assessment Upper Extremity Assessment Upper Extremity Assessment: Overall WFL for tasks assessed   Lower Extremity Assessment Lower Extremity Assessment: Overall WFL for tasks assessed       Communication Communication Communication: No difficulties   Cognition Arousal/Alertness: Awake/alert Behavior During Therapy: Flat affect Overall Cognitive Status: No family/caregiver present to determine baseline cognitive functioning  General Comments: Able to state name, but not month or year. Unable to recall information (e.g, date, current location) provided to pt minutes earlier.   General Comments       Exercises Other Exercises Other Exercises: Sit/stand x3 with RW, min assist +1-2 for safety.  Cuing for hand placement, but limited carry-over between reps.  Min assist +1 for static standing balance; mildly tremulous at times, delayed balance/righting reactions evident. Other Exercises: Orthostatic  assessment-see vitals flowsheet for details.  Does demonstrate approx 30 point drop in SBP from sitting to standing; symptomatic with transition to upright.  RN informed/aware. Other Exercises: Min A supine<sit, SUPV sit<supine. Sit<>stand x 3 w/ RW and Min A, +1-2 for safety. VCs for safe hand placement on RW. Max A for LB dressing (donning socks)   Shoulder Instructions      Home Living Family/patient expects to be discharged to:: Private residence Living Arrangements: Children Available Help at Discharge: Family Type of Home: House Home Access: Level entry     Home Layout: One level               Home Equipment: Environmental consultant - 2 wheels   Additional Comments: Pt lives with daughter at home. Unclear how frequent pt is home alone.      Prior Functioning/Environment          Comments: Patient limited historian; will verify with family as available.  Patient indicates ambulatory with RW for household distances; denies recent fall history.        OT Problem List: Decreased strength;Impaired balance (sitting and/or standing);Decreased cognition;Decreased activity tolerance;Decreased coordination;Decreased knowledge of use of DME or AE      OT Treatment/Interventions: Self-care/ADL training;DME and/or AE instruction;Therapeutic activities;Balance training;Therapeutic exercise;Patient/family education;Cognitive remediation/compensation    OT Goals(Current goals can be found in the care plan section) Acute Rehab OT Goals Patient Stated Goal: wants to go home OT Goal Formulation: With patient Time For Goal Achievement: 11/19/20 Potential to Achieve Goals: Good ADL Goals Pt Will Perform Grooming: with set-up;with supervision;sitting Pt Will Perform Upper Body Dressing: sitting;with min guard assist;with set-up Pt/caregiver will Perform Home Exercise Program: Increased strength;With minimal assist (to increase strength, endurance, and alertness)  OT Frequency: Min 1X/week    Barriers to D/C: Decreased caregiver support          Co-evaluation PT/OT/SLP Co-Evaluation/Treatment: Yes Reason for Co-Treatment: For patient/therapist safety;To address functional/ADL transfers;Necessary to address cognition/behavior during functional activity PT goals addressed during session: Mobility/safety with mobility OT goals addressed during session: ADL's and self-care      AM-PAC OT "6 Clicks" Daily Activity     Outcome Measure Help from another person eating meals?: None Help from another person taking care of personal grooming?: A Little Help from another person toileting, which includes using toliet, bedpan, or urinal?: A Lot Help from another person bathing (including washing, rinsing, drying)?: A Lot Help from another person to put on and taking off regular upper body clothing?: A Little Help from another person to put on and taking off regular lower body clothing?: A Lot 6 Click Score: 16   End of Session Equipment Utilized During Treatment: Gait belt;Rolling walker Nurse Communication: Other (comment)  Activity Tolerance: Patient tolerated treatment well Patient left: in bed;with call bell/phone within reach;with bed alarm set  OT Visit Diagnosis: Unsteadiness on feet (R26.81);Muscle weakness (generalized) (M62.81);Other symptoms and signs involving cognitive function                Time: 2725-3664 OT Time Calculation (  min): 33 min Charges:  OT General Charges $OT Visit: 1 Visit OT Evaluation $OT Eval Moderate Complexity: 1 Mod OT Treatments $Self Care/Home Management : 8-22 mins  Josiah Lobo, PhD, MS, OTR/L ascom [phone] 11/05/20, 1:11 PM

## 2020-11-05 NOTE — Procedures (Signed)
Pt too combative. Ripped leads off. Try tomorrow.

## 2020-11-06 DIAGNOSIS — R4182 Altered mental status, unspecified: Secondary | ICD-10-CM | POA: Diagnosis not present

## 2020-11-06 DIAGNOSIS — L8990 Pressure ulcer of unspecified site, unspecified stage: Secondary | ICD-10-CM | POA: Insufficient documentation

## 2020-11-06 DIAGNOSIS — R2990 Unspecified symptoms and signs involving the nervous system: Secondary | ICD-10-CM | POA: Diagnosis not present

## 2020-11-06 LAB — GLUCOSE, CAPILLARY
Glucose-Capillary: 101 mg/dL — ABNORMAL HIGH (ref 70–99)
Glucose-Capillary: 105 mg/dL — ABNORMAL HIGH (ref 70–99)
Glucose-Capillary: 130 mg/dL — ABNORMAL HIGH (ref 70–99)
Glucose-Capillary: 137 mg/dL — ABNORMAL HIGH (ref 70–99)
Glucose-Capillary: 206 mg/dL — ABNORMAL HIGH (ref 70–99)
Glucose-Capillary: 217 mg/dL — ABNORMAL HIGH (ref 70–99)
Glucose-Capillary: 252 mg/dL — ABNORMAL HIGH (ref 70–99)
Glucose-Capillary: 308 mg/dL — ABNORMAL HIGH (ref 70–99)
Glucose-Capillary: 370 mg/dL — ABNORMAL HIGH (ref 70–99)
Glucose-Capillary: 49 mg/dL — ABNORMAL LOW (ref 70–99)
Glucose-Capillary: 86 mg/dL (ref 70–99)
Glucose-Capillary: 92 mg/dL (ref 70–99)
Glucose-Capillary: 98 mg/dL (ref 70–99)

## 2020-11-06 LAB — URINE CULTURE

## 2020-11-06 LAB — INSULIN AND C-PEPTIDE, SERUM
C-Peptide: 6.9 ng/mL — ABNORMAL HIGH (ref 1.1–4.4)
Insulin: 48 u[IU]/mL — ABNORMAL HIGH (ref 2.6–24.9)

## 2020-11-06 LAB — BETA-HYDROXYBUTYRIC ACID: Beta-Hydroxybutyric Acid: <0.05 mmol/L — ABNORMAL LOW (ref 0.05–0.27)

## 2020-11-06 MED ORDER — OCTREOTIDE ACETATE 100 MCG/ML IJ SOLN
50.0000 ug | Freq: Four times a day (QID) | INTRAMUSCULAR | Status: DC
  Administered 2020-11-06 – 2020-11-07 (×3): 50 ug via SUBCUTANEOUS
  Filled 2020-11-06 (×5): qty 0.5

## 2020-11-06 MED ORDER — OCTREOTIDE ACETATE 50 MCG/ML IJ SOLN
50.0000 ug | Freq: Four times a day (QID) | INTRAMUSCULAR | Status: DC
  Filled 2020-11-06 (×2): qty 1

## 2020-11-06 MED ORDER — GLUCAGON HCL RDNA (DIAGNOSTIC) 1 MG IJ SOLR
1.0000 mg | Freq: Once | INTRAMUSCULAR | Status: AC | PRN
  Administered 2020-11-06: 13:00:00 1 mg via INTRAVENOUS
  Filled 2020-11-06: qty 1

## 2020-11-06 NOTE — Progress Notes (Addendum)
PROGRESS NOTE    Alexis Rodriguez  XBM:[id number] DOB: 1941/05/18 DOA: 11/04/2020 PCP: Perrin Maltese, MD   Brief Narrative: Taken from H&P. Alexis Rodriguez is a 79 y.o. female with medical history significant of hypertension, hyperlipidemia, dementia, iron deficiency anemia, CKD-3, who presents with strokelike symptoms.  Per her daughter, patient has strokelike symptoms for about 1 month. Symptoms have been going on intermittently, including confusion, blurry vision, slurred speech, slow response with staring off. No episode of fully loss of consciousness. No facial droop. Patient moves all extremities. Patient does not have chest pain, cough, shortness breath, fever or chills. No nausea, vomiting, diarrhea, abdominal pain, symptoms of UTI.   He was also found to have hypoglycemia without any prior diagnosis of diabetes or being on any antidiabetic medications. CT head and MRI without any acute infarct.  Continue to drop blood glucose level.  Requiring D10. Neurology was consulted-recommending stroke work-up and EEG.  Subjective: Patient was pleasant with me but at times becoming combative and pulling her lines.  Pleasantly confused, oriented to self only.  Denies any complaint.  Assessment & Plan:   Principal Problem:   Stroke-like symptoms Active Problems:   HTN (hypertension)   Hypoglycemia   Dementia (HCC)   HLD (hyperlipidemia)   Iron deficiency anemia   CKD (chronic kidney disease), stage IIIa   Depression   Pressure injury of skin  Stroke right symptoms/persistent intermittent hypoglycemia.  Imaging is negative for any acute infarct.  Echocardiogram with normal EF and grade 1 diastolic dysfunction.  A1c of 4.7.  EEG was ordered-still pending. Very vague symptoms might be due to intermittent hypoglycemia. Intermittent symptoms for 1 month, where she intermittently seems more confused, staring and sometimes sweating.  Per daughter she is having good p.o. intake and ready consistent  with her routine. Patient has underlying dementia and is oriented to self only at baseline. Patient continued to drop her CBG despite on being 10 requiring D50 intermittently. Insulin and C-peptide elevated. Sulfonylurea levels pending. A.m. cortisol mildly elevated. Pancreatic protocol MRI without any significant abnormality. 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 -Give her glucagon challenge. -Check insulin receptor antibodies. -Check pro insulin and beta hydroxybutyrate. -Start her on octreotide 50 mcg subcu every 6 hourly and see if that will help maintaining her blood glucose level. Q. 8 hourly diazoxide can be a p.o. option-really appreciate help from our pharmacy.  Not available here but we are looking for options. -Continue CBG monitoring. -Patient needs an endocrinology consult-Dr. Solum's office is closed for the holidays.  Pyuria.  No urinary symptoms.  Urine cultures are pending.Per daughter received recent 3 day course of cipro.by PCP for concern of UTI. -Follow-up urine cultures-multiple species. -No need for antibiotics at this time.  Hypertension.  She was not on any antihypertensives at home.  Blood pressure mostly in 027O systolic. -Continue to monitor.  Depression.  No acute concern. -Continue home dose of Zoloft and olanzapine.  Dementia ([place]) -Continue Namzaric  HLD (hyperlipidemia) -Continue Tricor  Iron deficiency anemia: Hemoglobin 13.7 -Continue iron supplement  CKD (chronic kidney disease), stage IIIa: Stable -Follow-up with BMP  Objective: Vitals:   11/05/20 2016 11/06/20 0023 11/06/20 0453 11/06/20 0804  BP: (!) 134/120 (!) 141/61 (!) 142/63 (!) 149/72  Pulse: 85 (!) 59 63 (!) 58  Resp: 17 17 17 18   Temp: 98 F (36.7 C) 98.2 F (36.8 C) 98.2 F (36.8 C) 98.2 F (36.8 C)  TempSrc:    Oral  SpO2: 96% 98%  97% 98%  Weight:      Height:        Intake/Output Summary (Last 24 hours) at 11/06/2020 1215 Last  data filed at 11/06/2020 1031 Gross per 24 hour  Intake 120 ml  Output 600 ml  Net -480 ml   Filed Weights   11/04/20 1340  Weight: 78.7 kg    Examination:  General. Pleasantly confused elderly lady, In no acute distress. Pulmonary.  Lungs clear bilaterally, normal respiratory effort. CV.  Regular rate and rhythm, no JVD, rub or murmur. Abdomen.  Soft, nontender, nondistended, BS positive. CNS.  Alert and oriented x3.  No focal neurologic deficit. Extremities.  No edema, no cyanosis, pulses intact and symmetrical. Psychiatry.  Judgment and insight appears normal.  DVT prophylaxis: Lovenox Code Status: Full Family Communication: Discussed with daughter on phone. Disposition Plan:  Status is: Inpatient  Remains inpatient appropriate because:Inpatient level of care appropriate due to severity of illness   Dispo: The patient is from: Home              Anticipated d/c is to: Home              Anticipated d/c date is: 1 day              Patient currently is not medically stable to d/c.   Consultants:   Neurology  Procedures:  Antimicrobials:   Data Reviewed: I have personally reviewed following labs and imaging studies  CBC: Recent Labs  Lab 11/04/20 1345  WBC 9.5  NEUTROABS 6.3  HGB 13.7  HCT 41.5  MCV 92.2  PLT 938   Basic Metabolic Panel: Recent Labs  Lab 11/04/20 1345  NA 137  K 4.1  CL 100  CO2 28  GLUCOSE 39*  BUN 14  CREATININE 1.20*  CALCIUM 9.2   GFR: Estimated Creatinine Clearance: 37.1 mL/min (A) (by C-G formula based on SCr of 1.2 mg/dL (H)). Liver Function Tests: Recent Labs  Lab 11/04/20 1345  AST 47*  ALT 25  ALKPHOS 35*  BILITOT 0.5  PROT 7.8  ALBUMIN 3.8   No results for input(s): LIPASE, AMYLASE in the last 168 hours. No results for input(s): AMMONIA in the last 168 hours. Coagulation Profile: Recent Labs  Lab 11/04/20 1345  INR 1.1   Cardiac Enzymes: No results for input(s): CKTOTAL, CKMB, CKMBINDEX, TROPONINI in  the last 168 hours. BNP (last 3 results) No results for input(s): PROBNP in the last 8760 hours. HbA1C: Recent Labs    11/05/20 0512  HGBA1C 4.7*   CBG: Recent Labs  Lab 11/06/20 0818 11/06/20 0838 11/06/20 0917 11/06/20 0957 11/06/20 1134  GLUCAP 49* 217* 92 137* 105*   Lipid Profile: Recent Labs    11/05/20 0512  CHOL 138  HDL 40*  LDLCALC 81  TRIG 83  CHOLHDL 3.5   Thyroid Function Tests: No results for input(s): TSH, T4TOTAL, FREET4, T3FREE, THYROIDAB in the last 72 hours. Anemia Panel: No results for input(s): VITAMINB12, FOLATE, FERRITIN, TIBC, IRON, RETICCTPCT in the last 72 hours. Sepsis Labs: No results for input(s): PROCALCITON, LATICACIDVEN in the last 168 hours.  Recent Results (from the past 240 hour(s))  Resp Panel by RT-PCR (Flu A&B, Covid) Nasopharyngeal Swab     Status: None   Collection Time: 11/04/20  3:14 PM   Specimen: Nasopharyngeal Swab; Nasopharyngeal(NP) swabs in vial transport medium  Result Value Ref Range Status   SARS Coronavirus 2 by RT PCR NEGATIVE NEGATIVE Final    Comment: (NOTE) SARS-CoV-2  target nucleic acids are NOT DETECTED.  The SARS-CoV-2 RNA is generally detectable in upper respiratory specimens during the acute phase of infection. The lowest concentration of SARS-CoV-2 viral copies this assay can detect is 138 copies/mL. A negative result does not preclude SARS-Cov-2 infection and should not be used as the sole basis for treatment or other patient management decisions. A negative result may occur with  improper specimen collection/handling, submission of specimen other than nasopharyngeal swab, presence of viral mutation(s) within the areas targeted by this assay, and inadequate number of viral copies(<138 copies/mL). A negative result must be combined with clinical observations, patient history, and epidemiological information. The expected result is Negative.  Fact Sheet for Patients:   EntrepreneurPulse.com.au  Fact Sheet for Healthcare Providers:  IncredibleEmployment.be  This test is no t yet approved or cleared by the [place] FDA and  has been authorized for detection and/or diagnosis of SARS-CoV-2 by FDA under an Emergency Use Authorization (EUA). This EUA will remain  in effect (meaning this test can be used) for the duration of the COVID-19 declaration under Section 564(b)(1) of the Act, 21 U.S.C.section 360bbb-3(b)(1), unless the authorization is terminated  or revoked sooner.       Influenza A by PCR NEGATIVE NEGATIVE Final   Influenza B by PCR NEGATIVE NEGATIVE Final    Comment: (NOTE) The Xpert Xpress SARS-CoV-2/FLU/RSV plus assay is intended as an aid in the diagnosis of influenza from Nasopharyngeal swab specimens and should not be used as a sole basis for treatment. Nasal washings and aspirates are unacceptable for Xpert Xpress SARS-CoV-2/FLU/RSV testing.  Fact Sheet for Patients: EntrepreneurPulse.com.au  Fact Sheet for Healthcare Providers: IncredibleEmployment.be  This test is not yet approved or cleared by the [place] FDA and has been authorized for detection and/or diagnosis of SARS-CoV-2 by FDA under an Emergency Use Authorization (EUA). This EUA will remain in effect (meaning this test can be used) for the duration of the COVID-19 declaration under Section 564(b)(1) of the Act, 21 U.S.C. section 360bbb-3(b)(1), unless the authorization is terminated or revoked.  Performed at [facility], [address], [place], [place] [address]   Urine Culture     Status: Abnormal   Collection Time: 11/04/20  3:42 PM   Specimen: Urine, Random  Result Value Ref Range Status   Specimen Description   Final    URINE, RANDOM Performed at [facility], [address], [place], [place] [address]    Special Requests   Final    NONE Performed at  [facility], [address] [place], [place] [address]    Culture MULTIPLE SPECIES PRESENT, SUGGEST RECOLLECTION (A)  Final   Report Status 11/06/2020 FINAL  Final     Radiology Studies: MR BRAIN WO CONTRAST  Result Date: 11/04/2020 CLINICAL DATA:  Initial evaluation for possible stroke. EXAM: MRI HEAD WITHOUT CONTRAST TECHNIQUE: Multiplanar, multiecho pulse sequences of the brain and surrounding structures were obtained without intravenous contrast. COMPARISON:  Prior CT from earlier same day. FINDINGS: Brain: Moderately advanced temporal lobe predominant cerebral atrophy. Patchy and confluent T2/FLAIR hyperintensity involving the periventricular deep white matter both cerebral hemispheres most likely related chronic small vessel ischemic disease, moderate in nature. No abnormal foci of restricted diffusion to suggest acute or subacute ischemia. Gray-white matter differentiation maintained. No encephalomalacia to suggest chronic cortical infarction. Tiny remote lacunar infarct present at the left thalamus. No evidence for acute or chronic intracranial hemorrhage. No mass lesion, midline shift or mass effect. No hydrocephalus or  extra-axial fluid collection. Incidental note made of a partially empty sella. Midline structures intact. Vascular: Major intracranial vascular flow voids are grossly maintained. Skull and upper cervical spine: Craniocervical junction within normal limits. Bone marrow signal intensity normal. No scalp soft tissue abnormality. Hyperostosis frontalis interna noted. Sinuses/Orbits: Globes and orbital soft tissues within normal limits. Paranasal sinuses are clear. No significant mastoid effusion. Inner ear structures grossly normal. Other: None. IMPRESSION: 1. No acute intracranial abnormality. 2. Moderately advanced temporal lobe predominant cerebral atrophy with chronic small vessel ischemic disease. Electronically Signed   By: Jeannine Boga M.D.   On: 11/04/2020  22:12   MR ABDOMEN W WO CONTRAST  Result Date: 11/06/2020 CLINICAL DATA:  Stroke-like symptoms. Assessment for pancreatic cancer. Chronic kidney disease. Hypertension. EXAM: MRI ABDOMEN WITHOUT AND WITH CONTRAST TECHNIQUE: Multiplanar multisequence MR imaging of the abdomen was performed both before and after the administration of intravenous contrast. CONTRAST:  34mL GADAVIST GADOBUTROL 1 MMOL/ML IV SOLN COMPARISON:  CT abdomen 11/27/2012 down FINDINGS: Lower chest: Unremarkable Hepatobiliary: 1.6 by 1.4 cm cystic lesion in the lateral segment left hepatic lobe, not appreciably changed from 2014. Additional small stable cysts noted. Prior cholecystectomy. Common hepatic duct at 1.4 cm, further distally the common bile duct measures about 1.0 cm. Tapering of the distal CBD without obvious filling defect. Pancreas: No significant abnormal differential enhancement in the pancreas to suggest pancreatic malignancy. No appreciable restriction of diffusion. No dorsal pancreatic duct dilatation. Spleen:  Unremarkable Adrenals/Urinary Tract: Mild scarring of the right kidney upper pole. The adrenal glands appear unremarkable. Somewhat flaccid but mildly distended urinary bladder on coronal images. Stomach/Bowel: Is small to moderate-sized type 1 hiatal hernia. Periampullary duodenal diverticulum noted without findings of surrounding inflammation. Vascular/Lymphatic: Aortoiliac atherosclerotic vascular disease. No pathologic adenopathy. Other:  No supplemental non-categorized findings. Musculoskeletal: Chronic appearing superior endplate compression L1. Hemangiomas eccentric to the left at T8 and T10. IMPRESSION: 1. No MRI findings of pancreatic malignancy. 2. Mild extrahepatic biliary dilatation with tapering of the distal CBD, but no obvious filling defect. This could be a physiologic response to cholecystectomy. Type 1 choledochal cyst is a less likely alternative possibility. 3. Small to moderate-sized type 1 hiatal  hernia. 4. Mild scarring of the right kidney upper pole. 5. Chronic appearing superior endplate compression L1. 6. Periampullary duodenal diverticulum without findings of surrounding inflammation. Electronically Signed   By: Van Clines M.D.   On: 11/06/2020 07:59   US Carotid Bilateral (at [facility] and [facility] only)  Result Date: 11/05/2020 CLINICAL DATA:  Stroke. Visual disturbance. History of hypertension and hyperlipidemia. EXAM: BILATERAL CAROTID DUPLEX ULTRASOUND TECHNIQUE: Pearline Cables scale imaging, color Doppler and duplex ultrasound were performed of bilateral carotid and vertebral arteries in the neck. COMPARISON:  None. FINDINGS: Criteria: Quantification of carotid stenosis is based on velocity parameters that correlate the residual internal carotid diameter with NASCET-based stenosis levels, using the diameter of the distal internal carotid lumen as the denominator for stenosis measurement. The following velocity measurements were obtained: RIGHT ICA: 52/4 cm/sec CCA: 26/7 cm/sec SYSTOLIC ICA/CCA RATIO:  0.8 ECA: 72 cm/sec LEFT ICA: 62/11 cm/sec CCA: 12/45 cm/sec SYSTOLIC ICA/CCA RATIO:  0.9 ECA: 77 cm/sec RIGHT CAROTID ARTERY: There is a minimal amount of eccentric echogenic plaque involving the origin and proximal aspect the right internal carotid artery (image 23), not resulting in elevated peak systolic velocities within the interrogated course of the right internal carotid artery to suggest a hemodynamically significant stenosis. RIGHT VERTEBRAL ARTERY:  Antegrade flow LEFT CAROTID ARTERY: There is  a minimal amount of eccentric echogenic plaque within the left carotid bulb (image 48), extending to involve the origin and proximal aspects of the left internal carotid artery (image 59), not resulting in elevated peak systolic velocities within the interrogated course the left internal carotid artery to suggest a hemodynamically significant stenosis. LEFT VERTEBRAL ARTERY:  Antegrade flow IMPRESSION:  Minimal amount of bilateral atherosclerotic plaque, left subjectively greater than right, not resulting in a hemodynamically significant stenosis within either internal carotid artery. Electronically Signed   By: Sandi Mariscal M.D.   On: 11/05/2020 15:06   ECHOCARDIOGRAM COMPLETE  Result Date: 11/05/2020    ECHOCARDIOGRAM REPORT   Patient Name:   SCARLETTROSE COSTILOW Date of Exam: 11/05/2020 Medical Rec #:  [id number]  Height:       63.0 in Accession #:    [id number] Weight:       173.6 lb Date of Birth:  1941-11-10  BSA:          1.821 m Patient Age:    80 years   BP:           158/83 mmHg Patient Gender: F          HR:           68 bpm. Exam Location:  [facility] Procedure: 2D Echo, Cardiac Doppler and Color Doppler Indications:     Stroke 434.91  History:         Patient has no prior history of Echocardiogram examinations.                  Risk Factors:Hypertension. Dementia.  Sonographer:     Sherrie Sport RDCS (AE) Referring Phys:  [id number] Soledad Gerlach NIU Diagnosing Phys: Kate Sable MD  Sonographer Comments: Suboptimal apical window. IMPRESSIONS  1. Left ventricular ejection fraction, by estimation, is 60 to 65%. The left ventricle has normal function. The left ventricle has no regional wall motion abnormalities. Left ventricular diastolic parameters are consistent with Grade I diastolic dysfunction (impaired relaxation).  2. Right ventricular systolic function is normal. The right ventricular size is normal.  3. The mitral valve is normal in structure. No evidence of mitral valve regurgitation.  4. The aortic valve is tricuspid. Aortic valve regurgitation is not visualized. Mild aortic valve sclerosis is present, with no evidence of aortic valve stenosis.  5. Echogenic structure noted in the posterior aorta (clips 2,3,7) this could be artifactual vs supravalvular membrane. Conclusion(s)/Recommendation(s): No intracardiac source of embolism detected on this transthoracic study. A transesophageal echocardiogram is recommended to  exclude cardiac source of embolism if clinically indicated. FINDINGS  Left Ventricle: Left ventricular ejection fraction, by estimation, is 60 to 65%. The left ventricle has normal function. The left ventricle has no regional wall motion abnormalities. The left ventricular internal cavity size was normal in size. There is  no left ventricular hypertrophy. Left ventricular diastolic parameters are consistent with Grade I diastolic dysfunction (impaired relaxation). Right Ventricle: The right ventricular size is normal. No increase in right ventricular wall thickness. Right ventricular systolic function is normal. Left Atrium: Left atrial size was normal in size. Right Atrium: Right atrial size was normal in size. Pericardium: There is no evidence of pericardial effusion. Mitral Valve: The mitral valve is normal in structure. No evidence of mitral valve regurgitation. Tricuspid Valve: The tricuspid valve is normal in structure. Tricuspid valve regurgitation is not demonstrated. Aortic Valve: The aortic valve is tricuspid. Aortic valve regurgitation is not visualized. Mild aortic valve sclerosis is present, with no evidence of  aortic valve stenosis. Aortic valve mean gradient measures 4.7 mmHg. Aortic valve peak gradient measures 8.0 mmHg. Aortic valve area, by VTI measures 1.61 cm. Pulmonic Valve: The pulmonic valve was normal in structure. Pulmonic valve regurgitation is not visualized. Aorta: Echogenic structure noted in the posterior aorta (clips 2,3,7) this could be artifactual vs supravalvular membrane. The aortic root is normal in size and structure. Venous: The inferior vena cava was not well visualized. IAS/Shunts: No atrial level shunt detected by color flow Doppler.  LEFT VENTRICLE PLAX 2D LVIDd:         3.95 cm  Diastology LVIDs:         2.27 cm  LV e' medial:    4.03 cm/s LV PW:         1.13 cm  LV E/e' medial:  17.6 LV IVS:        0.88 cm  LV e' lateral:   4.79 cm/s LVOT diam:     2.00 cm  LV E/e'  lateral: 14.8 LV SV:         48 LV SV Index:   26 LVOT Area:     3.14 cm  LEFT ATRIUM             Index       RIGHT ATRIUM          Index LA diam:        3.50 cm 1.92 cm/m  RA Area:     9.69 cm LA Vol (A2C):   22.9 ml 12.58 ml/m RA Volume:   19.00 ml 10.44 ml/m LA Vol (A4C):   23.7 ml 13.02 ml/m LA Biplane Vol: 24.0 ml 13.18 ml/m  AORTIC VALVE                   PULMONIC VALVE AV Area (Vmax):    1.56 cm    PV Vmax:        0.79 m/s AV Area (Vmean):   1.60 cm    PV Peak grad:   2.5 mmHg AV Area (VTI):     1.61 cm    RVOT Peak grad: 4 mmHg AV Vmax:           141.33 cm/s AV Vmean:          99.033 cm/s AV VTI:            0.297 m AV Peak Grad:      8.0 mmHg AV Mean Grad:      4.7 mmHg LVOT Vmax:         70.20 cm/s LVOT Vmean:        50.400 cm/s LVOT VTI:          0.152 m LVOT/AV VTI ratio: 0.51  AORTA Ao Root diam: 2.90 cm MITRAL VALVE MV Area (PHT): 1.92 cm     SHUNTS MV Decel Time: 396 msec     Systemic VTI:  0.15 m MV E velocity: 70.80 cm/s   Systemic Diam: 2.00 cm MV A velocity: 113.00 cm/s MV E/A ratio:  0.63 Kate Sable MD Electronically signed by Kate Sable MD Signature Date/Time: 11/05/2020/2:41:47 PM    Final    CT HEAD CODE STROKE WO CONTRAST  Result Date: 11/04/2020 CLINICAL DATA:  Code stroke. Neuro deficit, acute stroke suspected. Sudden speech in vision issues. EXAM: CT HEAD WITHOUT CONTRAST TECHNIQUE: Contiguous axial images were obtained from the base of the skull through the vertex without intravenous contrast. COMPARISON:  CT head February 01, 2013. FINDINGS: Brain:  No evidence of acute large vascular territory infarction, hemorrhage, hydrocephalus, extra-axial collection or mass lesion/mass effect. Patchy white matter hypoattenuation, most likely related to chronic microvascular disease. Mild generalized cerebral volume loss with ex vacuo ventricular dilation. Vascular: No hyperdense vessel identified. Calcific atherosclerosis. Skull: No acute fracture. Sinuses/Orbits: No acute  findings. Other: No mastoid effusions. ASPECTS [facility] Stroke Program Early CT Score) total score (0-10 with 10 being normal): 10. IMPRESSION: 1. No evidence of acute intracranial abnormality.ASPECTS is 10. 2. Chronic microvascular ischemic disease and generalized cerebral volume loss. Code stroke imaging results were communicated on 11/04/2020 at 1:40 pm to provider Dr. Charna Archer Via telephone, who verbally acknowledged these results. Electronically Signed   By: Margaretha Sheffield MD   On: 11/04/2020 13:42    Scheduled Meds: . aspirin EC  81 mg Oral Daily  . memantine  28 mg Oral QHS   And  . donepezil  10 mg Oral QHS  . enoxaparin (LOVENOX) injection  40 mg Subcutaneous Q2200  . fenofibrate  160 mg Oral Daily  . ferrous sulfate  325 mg Oral Daily  . magnesium gluconate  250 mg Oral Daily  . melatonin  5 mg Oral QHS  . OLANZapine  5 mg Oral QHS  . rosuvastatin  10 mg Oral Daily  . sertraline  25 mg Oral Daily   Continuous Infusions: . dextrose 75 mL/hr at 11/06/20 1211     LOS: 1 day   Time spent: 40 minutes.  Lorella Nimrod, MD [facility]  If 7PM-7AM, please contact night-coverage [email or web address]  11/06/2020, 12:15 PM   This record has been created using Systems analyst. Errors have been sought and corrected,but may not always be located. Such creation errors do not reflect on the standard of care.

## 2020-11-06 NOTE — Progress Notes (Signed)
eeg done °

## 2020-11-06 NOTE — Procedures (Signed)
ELECTROENCEPHALOGRAM REPORT   Patient: Alexis Rodriguez       Room #: 120A-AA EEG No. ID: [id number] Age: 79 y.o.        Sex: female Requesting Physician: Reesa Chew Report Date:  11/06/2020        Interpreting Physician: Alexis Goodell  History: Alexis Rodriguez is an 79 y.o. female with episodes of unresponsiveness  Medications:  ASA, Aricept, Melatonin, Namenda, Zyprexa, Crestor, Zoloft  Conditions of Recording:  This is a 21 channel routine scalp EEG performed with bipolar and monopolar montages arranged in accordance to the international 10/20 system of electrode placement. One channel was dedicated to EKG recording.  The patient is in the awake state.  Description:  Artifact is prominent during the recording often obscuring the background rhythm. When able to be visualized the background is low voltage and continuous.  Although no posterior background rhythm could be evaluated there was no evidence of epileptiform activity.  The patient does not drowse or sleep. Hyperventilation was not performed. Intermittent photic stimulation was performed but failed to illicit any change in the tracing.    IMPRESSION: This is a technically difficult awake electroencephalogram secondary to artifact.  No epileptiform activity is noted.     Alexis Goodell, MD Neurology  11/06/2020, 7:47 PM

## 2020-11-07 DIAGNOSIS — R2990 Unspecified symptoms and signs involving the nervous system: Secondary | ICD-10-CM | POA: Diagnosis not present

## 2020-11-07 LAB — GLUCOSE, CAPILLARY
Glucose-Capillary: 34 mg/dL — CL (ref 70–99)
Glucose-Capillary: 232 mg/dL — ABNORMAL HIGH (ref 70–99)
Glucose-Capillary: 194 mg/dL — ABNORMAL HIGH (ref 70–99)
Glucose-Capillary: 146 mg/dL — ABNORMAL HIGH (ref 70–99)
Glucose-Capillary: 152 mg/dL — ABNORMAL HIGH (ref 70–99)
Glucose-Capillary: 147 mg/dL — ABNORMAL HIGH (ref 70–99)
Glucose-Capillary: 155 mg/dL — ABNORMAL HIGH (ref 70–99)
Glucose-Capillary: 160 mg/dL — ABNORMAL HIGH (ref 70–99)
Glucose-Capillary: 140 mg/dL — ABNORMAL HIGH (ref 70–99)
Glucose-Capillary: 128 mg/dL — ABNORMAL HIGH (ref 70–99)

## 2020-11-07 LAB — CBC
HCT: 38.8 % (ref 36.0–46.0)
Hemoglobin: 13.2 g/dL (ref 12.0–15.0)
MCH: 30.5 pg (ref 26.0–34.0)
MCHC: 34 g/dL (ref 30.0–36.0)
MCV: 89.6 fL (ref 80.0–100.0)
Platelets: 271 10*3/uL (ref 150–400)
RBC: 4.33 MIL/uL (ref 3.87–5.11)
RDW: 12.4 % (ref 11.5–15.5)
WBC: 8.9 10*3/uL (ref 4.0–10.5)
nRBC: 0 % (ref 0.0–0.2)

## 2020-11-07 LAB — BASIC METABOLIC PANEL
Anion gap: 10 (ref 5–15)
BUN: 13 mg/dL (ref 8–23)
CO2: 26 mmol/L (ref 22–32)
Calcium: 8.9 mg/dL (ref 8.9–10.3)
Chloride: 92 mmol/L — ABNORMAL LOW (ref 98–111)
Creatinine, Ser: 1.07 mg/dL — ABNORMAL HIGH (ref 0.44–1.00)
GFR, Estimated: 53 mL/min — ABNORMAL LOW (ref >60)
Glucose, Bld: 201 mg/dL — ABNORMAL HIGH (ref 70–99)
Potassium: 4 mmol/L (ref 3.5–5.1)
Sodium: 128 mmol/L — ABNORMAL LOW (ref 135–145)

## 2020-11-07 MED ORDER — DIAZOXIDE 50 MG/ML PO SUSP
3.0000 mg/kg/d | Freq: Three times a day (TID) | ORAL | Status: DC
  Filled 2020-11-07: qty 1.6

## 2020-11-07 MED ORDER — DIAZOXIDE 50 MG/ML PO SUSP
3.0000 mg/kg/d | Freq: Two times a day (BID) | ORAL | Status: DC
  Administered 2020-11-07 – 2020-11-08 (×4): 120 mg via ORAL
  Filled 2020-11-07 (×5): qty 2.4

## 2020-11-07 NOTE — TOC Progression Note (Signed)
Transition of Care [facility]) - Progression Note    Patient Details  Name: Asianae Minkler MRN: [id number] Date of Birth: [birth year]  [facility] CM/SW Contact  Shelbie Hutching, RN Phone Number: 11/07/2020, 1:54 PM  Clinical Narrative:    PT is now recommending SNF.  Family is okay with patient going for short term rehab and they prefer [facility].  Bed search started.     Expected Discharge Plan: [place] Barriers to Discharge: Continued Medical Work up  Expected Discharge Plan and Services Expected Discharge Plan: [place] Choice: [place] arrangements for the past 2 months: Single Family Home                 DME Arranged: 3-N-1 DME Agency: [employer] Date DME Agency Contacted: 11/05/20   Representative spoke with at DME Agency: [place]: PT,OT           Social Determinants of Health (SDOH) Interventions    Readmission Risk Interventions No flowsheet data found.

## 2020-11-07 NOTE — Progress Notes (Signed)
Physical Therapy Treatment Patient Details Name: Alexis Rodriguez MRN: [id number] DOB: 08-18-1941 Today's Date: 11/07/2020    History of Present Illness presented to ER secondary to AMS, periods of unresponsiveness, questionable seizure-like activity; admitted for TIA/CVA work up.  MRI negative for acute insult.    PT Comments    Pt seen this am with daughter present in room. Pt resting comfortably in bed agreed to OOB activity. Pt required MInA for supine to sit with HOB raised and use of side rail, MinA to scoot to edge of bed. Fair upright sitting balance x 4 minutes. Sit to stand at [facility] with Min/ModA and repeated verbal cues for safety and technique. Pt required assistance to safely turn and sit properly on BSC due to fear of falling and weakness. Pt without c/o dizziness. HR remained between 84-91bpm during transfer.  Assistance needed for hygiene. Pt attempted several times to transfer back to edge of bed with RW, however was not physically or cognitively safe to complete. ModA for stand pivot back to bed, ModA sit to supine. Pt positioned in upright position in bed, pt's nurse in room to witness mobility concerns and daughter's statement regarding pt's significant functional decline from recent independence at home.  Pt does not appear safe to return home at this time due to weakness and safety concerns. Pt may benefit from SNF placement in order to regain baseline LOF.   Follow Up Recommendations  Home health PT;SNF     Equipment Recommendations  Rolling walker with 5" wheels;3in1 (PT)    Recommendations for Other Services       Precautions / Restrictions Precautions Precautions: Fall    Mobility  Bed Mobility Overal bed mobility: Needs Assistance Bed Mobility: Supine to Sit;Sit to Supine     Supine to sit: Min assist Sit to supine: HOB elevated   General bed mobility comments: increased time required to complete; limited dissociation of trunk/extremities  Transfers Overall  transfer level: Needs assistance Equipment used: Rolling walker (2 wheeled) Transfers: Sit to/from Stand Sit to Stand: Min assist;+2 safety/equipment         General transfer comment: tends to pull on RW with sit/stand  Ambulation/Gait                 Stairs             Wheelchair Mobility    Modified Rankin (Stroke Patients Only)       Balance                                            Cognition Arousal/Alertness: Awake/alert Behavior During Therapy: Flat affect Overall Cognitive Status: Impaired/Different from baseline (Daughter states pt not at baseline LOF.  Pt alert to self) Area of Impairment: Following commands;Safety/judgement;Awareness;Problem solving                         Safety/Judgement: Decreased awareness of safety   Problem Solving: Slow processing General Comments: Able to state name, but not month or year. Unable to recall information (e.g, date, current location) provided to pt minutes earlier.      Exercises      General Comments        Pertinent Vitals/Pain Pain Assessment: No/denies pain    Home Living  Prior Function            PT Goals (current goals can now be found in the care plan section) Acute Rehab PT Goals Patient Stated Goal: wants to go home Progress towards PT goals: Progressing toward goals    Frequency    Min 2X/week      PT Plan      Co-evaluation              AM-PAC PT "6 Clicks" Mobility   Outcome Measure  Help needed turning from your back to your side while in a flat bed without using bedrails?: A Little Help needed moving from lying on your back to sitting on the side of a flat bed without using bedrails?: A Lot Help needed moving to and from a bed to a chair (including a wheelchair)?: A Little Help needed standing up from a chair using your arms (e.g., wheelchair or bedside chair)?: A Little Help needed to walk in  hospital room?: A Little Help needed climbing 3-5 steps with a railing? : A Lot 6 Click Score: 16    End of Session Equipment Utilized During Treatment: Gait belt Activity Tolerance:  (Pt required increased assistance per pt's daughter for functional mobility and transfers. Pt does not appear safe to return home with family assist at this time and may benefit from SNF) Patient left: in bed;with call bell/phone within reach;with bed alarm set;with family/visitor present;with SCD's reapplied Nurse Communication: Mobility status PT Visit Diagnosis: Muscle weakness (generalized) (M62.81);Difficulty in walking, not elsewhere classified (R26.2)     Time: 1050-1120 PT Time Calculation (min) (ACUTE ONLY): 30 min  Charges:  $Therapeutic Activity: 23-37 mins                    Mikel Cella, PTA  Josie Dixon 11/07/2020, 1:21 PM

## 2020-11-07 NOTE — Progress Notes (Signed)
PROGRESS NOTE    Kody Vigil  KXF:[id number] DOB: 06-24-1941 DOA: 11/04/2020 PCP: Perrin Maltese, MD   Brief Narrative: Taken from H&P. Traniya Prichett is a 79 y.o. female with medical history significant of hypertension, hyperlipidemia, dementia, iron deficiency anemia, CKD-3, who presents with strokelike symptoms.  Per her daughter, patient has strokelike symptoms for about 1 month. Symptoms have been going on intermittently, including confusion, blurry vision, slurred speech, slow response with staring off. No episode of fully loss of consciousness. No facial droop. Patient moves all extremities. Patient does not have chest pain, cough, shortness breath, fever or chills. No nausea, vomiting, diarrhea, abdominal pain, symptoms of UTI.   He was also found to have hypoglycemia without any prior diagnosis of diabetes or being on any antidiabetic medications. CT head and MRI without any acute infarct.  Continue to drop blood glucose level.  Requiring D10. Neurology was consulted-recommending stroke work-up and EEG.  Subjective: Patient had no new complaint today.  Did not ate her breakfast yet. Able to maintain her blood glucose level today.  Assessment & Plan:   Principal Problem:   Stroke-like symptoms Active Problems:   HTN (hypertension)   Hypoglycemia   Dementia (HCC)   HLD (hyperlipidemia)   Iron deficiency anemia   CKD (chronic kidney disease), stage IIIa   Depression   Pressure injury of skin  Stroke right symptoms/persistent intermittent hypoglycemia.  Imaging is negative for any acute infarct.  Echocardiogram with normal EF and grade 1 diastolic dysfunction.  A1c of 4.7.  EEG without any epileptiform changes. Very vague symptoms might be due to intermittent hypoglycemia. Intermittent symptoms for 1 month, where she intermittently seems more confused, staring and sometimes sweating.  Per daughter she is having good p.o. intake and ready consistent with her routine. Patient has  underlying dementia and is oriented to self only at baseline. Patient responded well to glucagon challenge and later blood sugar increased above 200 with octreotide subcu. had curbside discussion with endocrinology and our pharmacist and decided to start her on diazoxide.  Able to maintain her blood glucose level so far. Insulin and C-peptide elevated. Sulfonylurea , pro insuline, insulin antibodies levels pending. A.m. cortisol mildly elevated. Pancreatic protocol MRI without any significant abnormality. 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 -Continue with diazoxide-decrease the dose to twice daily to see if she is still able to maintain her blood glucose on that dose. -Continue CBG monitoring. -Patient needs an endocrinology consult-Dr. Solum's office is closed for the holidays.  Physical deconditioning.  PT evaluated her today and they are recommending SNF.  Family is agreeable. -TOC started working on it-most likely will happen after the holidays.  Pyuria.  No urinary symptoms.  Urine cultures are pending.Per daughter received recent 3 day course of cipro.by PCP for concern of UTI. - urine cultures-multiple species. -No need for antibiotics at this time.  Hypertension.  She was not on any antihypertensives at home.  Blood pressure little elevated today. -Continue to monitor. -Hydralazine as needed for systolic above 696.  Depression.  No acute concern. -Continue home dose of Zoloft and olanzapine.  Dementia ([place]) -Continue Namzaric  HLD (hyperlipidemia) -Continue Tricor  Iron deficiency anemia: Hemoglobin 13.7 -Continue iron supplement  CKD (chronic kidney disease), stage IIIa: Stable -Follow-up with BMP  Objective: Vitals:   11/06/20 2346 11/07/20 0437 11/07/20 0740 11/07/20 1141  BP: (!) 159/71 (!) 153/71 (!) 178/77 139/64  Pulse: (!) 55 (!) 57 (!) 57 60  Resp: 17 19 18  Temp: 97.7 F (36.5 C) 98.6 F (37 C) (!) 97.5 F (36.4 C)  97.7 F (36.5 C)  TempSrc:  Oral Oral   SpO2: 97% 100% 100% 97%  Weight:      Height:        Intake/Output Summary (Last 24 hours) at 11/07/2020 1408 Last data filed at 11/06/2020 2350 Gross per 24 hour  Intake --  Output 350 ml  Net -350 ml   Filed Weights   11/04/20 1340  Weight: 78.7 kg    Examination:  General.  Pleasantly confused elderly lady, in no acute distress. Pulmonary.  Lungs clear bilaterally, normal respiratory effort. CV.  Regular rate and rhythm, no JVD, rub or murmur. Abdomen.  Soft, nontender, nondistended, BS positive. CNS.  Alert and oriented x3.  No focal neurologic deficit. Extremities.  No edema, no cyanosis, pulses intact and symmetrical. Psychiatry.  Judgment and insight appears normal.  DVT prophylaxis: Lovenox Code Status: Full Family Communication: Discussed with daughter on phone. Disposition Plan:  Status is: Inpatient  Remains inpatient appropriate because:Inpatient level of care appropriate due to severity of illness   Dispo: The patient is from: Home              Anticipated d/c is to: SNF              Anticipated d/c date is: 1 day              Patient currently is medically stable.  TOC is looking for placement.   Consultants:   Neurology  Procedures:  Antimicrobials:   Data Reviewed: I have personally reviewed following labs and imaging studies  CBC: Recent Labs  Lab 11/04/20 1345 11/07/20 0608  WBC 9.5 8.9  NEUTROABS 6.3  --   HGB 13.7 13.2  HCT 41.5 38.8  MCV 92.2 89.6  PLT 306 010   Basic Metabolic Panel: Recent Labs  Lab 11/04/20 1345 11/07/20 0608  NA 137 128*  K 4.1 4.0  CL 100 92*  CO2 28 26  GLUCOSE 39* 201*  BUN 14 13  CREATININE 1.20* 1.07*  CALCIUM 9.2 8.9   GFR: Estimated Creatinine Clearance: 41.6 mL/min (A) (by C-G formula based on SCr of 1.07 mg/dL (H)). Liver Function Tests: Recent Labs  Lab 11/04/20 1345  AST 47*  ALT 25  ALKPHOS 35*  BILITOT 0.5  PROT 7.8  ALBUMIN 3.8    No results for input(s): LIPASE, AMYLASE in the last 168 hours. No results for input(s): AMMONIA in the last 168 hours. Coagulation Profile: Recent Labs  Lab 11/04/20 1345  INR 1.1   Cardiac Enzymes: No results for input(s): CKTOTAL, CKMB, CKMBINDEX, TROPONINI in the last 168 hours. BNP (last 3 results) No results for input(s): PROBNP in the last 8760 hours. HbA1C: Recent Labs    11/05/20 0512  HGBA1C 4.7*   CBG: Recent Labs  Lab 11/06/20 2347 11/07/20 0238 11/07/20 0739 11/07/20 1036 11/07/20 1105  GLUCAP 252* 232* 194* 152* 146*   Lipid Profile: Recent Labs    11/05/20 0512  CHOL 138  HDL 40*  LDLCALC 81  TRIG 83  CHOLHDL 3.5   Thyroid Function Tests: No results for input(s): TSH, T4TOTAL, FREET4, T3FREE, THYROIDAB in the last 72 hours. Anemia Panel: No results for input(s): VITAMINB12, FOLATE, FERRITIN, TIBC, IRON, RETICCTPCT in the last 72 hours. Sepsis Labs: No results for input(s): PROCALCITON, LATICACIDVEN in the last 168 hours.  Recent Results (from the past 240 hour(s))  Resp Panel by RT-PCR (Flu A&B,  Covid) Nasopharyngeal Swab     Status: None   Collection Time: 11/04/20  3:14 PM   Specimen: Nasopharyngeal Swab; Nasopharyngeal(NP) swabs in vial transport medium  Result Value Ref Range Status   SARS Coronavirus 2 by RT PCR NEGATIVE NEGATIVE Final    Comment: (NOTE) SARS-CoV-2 target nucleic acids are NOT DETECTED.  The SARS-CoV-2 RNA is generally detectable in upper respiratory specimens during the acute phase of infection. The lowest concentration of SARS-CoV-2 viral copies this assay can detect is 138 copies/mL. A negative result does not preclude SARS-Cov-2 infection and should not be used as the sole basis for treatment or other patient management decisions. A negative result may occur with  improper specimen collection/handling, submission of specimen other than nasopharyngeal swab, presence of viral mutation(s) within the areas targeted  by this assay, and inadequate number of viral copies(<138 copies/mL). A negative result must be combined with clinical observations, patient history, and epidemiological information. The expected result is Negative.  Fact Sheet for Patients:  EntrepreneurPulse.com.au  Fact Sheet for Healthcare Providers:  IncredibleEmployment.be  This test is no t yet approved or cleared by the [place] FDA and  has been authorized for detection and/or diagnosis of SARS-CoV-2 by FDA under an Emergency Use Authorization (EUA). This EUA will remain  in effect (meaning this test can be used) for the duration of the COVID-19 declaration under Section 564(b)(1) of the Act, 21 U.S.C.section 360bbb-3(b)(1), unless the authorization is terminated  or revoked sooner.       Influenza A by PCR NEGATIVE NEGATIVE Final   Influenza B by PCR NEGATIVE NEGATIVE Final    Comment: (NOTE) The Xpert Xpress SARS-CoV-2/FLU/RSV plus assay is intended as an aid in the diagnosis of influenza from Nasopharyngeal swab specimens and should not be used as a sole basis for treatment. Nasal washings and aspirates are unacceptable for Xpert Xpress SARS-CoV-2/FLU/RSV testing.  Fact Sheet for Patients: EntrepreneurPulse.com.au  Fact Sheet for Healthcare Providers: IncredibleEmployment.be  This test is not yet approved or cleared by the [place] FDA and has been authorized for detection and/or diagnosis of SARS-CoV-2 by FDA under an Emergency Use Authorization (EUA). This EUA will remain in effect (meaning this test can be used) for the duration of the COVID-19 declaration under Section 564(b)(1) of the Act, 21 U.S.C. section 360bbb-3(b)(1), unless the authorization is terminated or revoked.  Performed at [facility], [address], [place], [place] [address]   Urine Culture     Status: Abnormal   Collection Time: 11/04/20  3:42  PM   Specimen: Urine, Random  Result Value Ref Range Status   Specimen Description   Final    URINE, RANDOM Performed at [facility], [address], [place], [place] [address]    Special Requests   Final    NONE Performed at [facility], [address] [place], [place] [address]    Culture MULTIPLE SPECIES PRESENT, SUGGEST RECOLLECTION (A)  Final   Report Status 11/06/2020 FINAL  Final     Radiology Studies: EEG  Result Date: 11/06/2020 Alexis Goodell, MD     11/06/2020  7:50 PM ELECTROENCEPHALOGRAM REPORT Patient: Alexis Rodriguez       Room #: 120A-AA EEG No. ID: [id number] Age: 79 y.o.        Sex: female Requesting Physician: Reesa Chew Report Date:  11/06/2020       Interpreting Physician: Alexis Goodell History: Youa Deloney is an 79 y.o. female with episodes of unresponsiveness Medications: ASA, Aricept, Melatonin,  Namenda, Zyprexa, Crestor, Zoloft Conditions of Recording:  This is a 21 channel routine scalp EEG performed with bipolar and monopolar montages arranged in accordance to the international 10/20 system of electrode placement. One channel was dedicated to EKG recording. The patient is in the awake state. Description:  Artifact is prominent during the recording often obscuring the background rhythm. When able to be visualized the background is low voltage and continuous.  Although no posterior background rhythm could be evaluated there was no evidence of epileptiform activity. The patient does not drowse or sleep. Hyperventilation was not performed. Intermittent photic stimulation was performed but failed to illicit any change in the tracing. IMPRESSION: This is a technically difficult awake electroencephalogram secondary to artifact.  No epileptiform activity is noted.  Alexis Goodell, MD Neurology 11/06/2020, 7:47 PM   MR ABDOMEN W WO CONTRAST  Result Date: 11/06/2020 CLINICAL DATA:  Stroke-like symptoms. Assessment for pancreatic cancer. Chronic kidney disease.  Hypertension. EXAM: MRI ABDOMEN WITHOUT AND WITH CONTRAST TECHNIQUE: Multiplanar multisequence MR imaging of the abdomen was performed both before and after the administration of intravenous contrast. CONTRAST:  87mL GADAVIST GADOBUTROL 1 MMOL/ML IV SOLN COMPARISON:  CT abdomen 11/27/2012 down FINDINGS: Lower chest: Unremarkable Hepatobiliary: 1.6 by 1.4 cm cystic lesion in the lateral segment left hepatic lobe, not appreciably changed from 2014. Additional small stable cysts noted. Prior cholecystectomy. Common hepatic duct at 1.4 cm, further distally the common bile duct measures about 1.0 cm. Tapering of the distal CBD without obvious filling defect. Pancreas: No significant abnormal differential enhancement in the pancreas to suggest pancreatic malignancy. No appreciable restriction of diffusion. No dorsal pancreatic duct dilatation. Spleen:  Unremarkable Adrenals/Urinary Tract: Mild scarring of the right kidney upper pole. The adrenal glands appear unremarkable. Somewhat flaccid but mildly distended urinary bladder on coronal images. Stomach/Bowel: Is small to moderate-sized type 1 hiatal hernia. Periampullary duodenal diverticulum noted without findings of surrounding inflammation. Vascular/Lymphatic: Aortoiliac atherosclerotic vascular disease. No pathologic adenopathy. Other:  No supplemental non-categorized findings. Musculoskeletal: Chronic appearing superior endplate compression L1. Hemangiomas eccentric to the left at T8 and T10. IMPRESSION: 1. No MRI findings of pancreatic malignancy. 2. Mild extrahepatic biliary dilatation with tapering of the distal CBD, but no obvious filling defect. This could be a physiologic response to cholecystectomy. Type 1 choledochal cyst is a less likely alternative possibility. 3. Small to moderate-sized type 1 hiatal hernia. 4. Mild scarring of the right kidney upper pole. 5. Chronic appearing superior endplate compression L1. 6. Periampullary duodenal diverticulum without  findings of surrounding inflammation. Electronically Signed   By: Van Clines M.D.   On: 11/06/2020 07:59    Scheduled Meds: . aspirin EC  81 mg Oral Daily  . diazoxide  3 mg/kg/day Oral Q12H  . memantine  28 mg Oral QHS   And  . donepezil  10 mg Oral QHS  . enoxaparin (LOVENOX) injection  40 mg Subcutaneous Q2200  . fenofibrate  160 mg Oral Daily  . ferrous sulfate  325 mg Oral Daily  . magnesium gluconate  250 mg Oral Daily  . melatonin  5 mg Oral QHS  . OLANZapine  5 mg Oral QHS  . rosuvastatin  10 mg Oral Daily  . sertraline  25 mg Oral Daily   Continuous Infusions:    LOS: 2 days   Time spent: 35 minutes.  Lorella Nimrod, MD [facility]  If 7PM-7AM, please contact night-coverage [email or web address]  11/07/2020, 2:08 PM   This record has been created using Dragon voice recognition  software. Errors have been sought and corrected,but may not always be located. Such creation errors do not reflect on the standard of care.

## 2020-11-07 NOTE — NC FL2 (Signed)
[place] LEVEL OF CARE SCREENING TOOL     IDENTIFICATION  Patient Name: Alexis Rodriguez Birthdate: [birth year] Sex: female Admission Date (Current Location): 11/04/2020  [place] and [place] Number:  Engineering geologist and Address:  [facility], [address], [place], [place] [address]      Provider Number: [id number]  Attending Physician Name and Address:  Lorella Nimrod, MD  Relative Name and Phone Number:  Nikky Duba daughter [phone]    Current Level of Care: Hospital Recommended Level of Care: [name] Prior Approval Number:    Date Approved/Denied:   PASRR Number:    Discharge Plan: SNF    Current Diagnoses: Patient Active Problem List   Diagnosis Date Noted  . Pressure injury of skin 11/06/2020  . Stroke-like symptoms 11/04/2020  . Depression 11/04/2020  . HTN (hypertension)   . Hypoglycemia   . Dementia ([place])   . HLD (hyperlipidemia)   . Iron deficiency anemia   . CKD (chronic kidney disease), stage IIIa     Orientation RESPIRATION BLADDER Height & Weight     Self  Normal Incontinent Weight: 78.7 kg Height:  5\' 3"  (160 cm)  BEHAVIORAL SYMPTOMS/MOOD NEUROLOGICAL BOWEL NUTRITION STATUS      Incontinent Diet (heart healthy)  AMBULATORY STATUS COMMUNICATION OF NEEDS Skin   Extensive Assist Verbally Normal                       Personal Care Assistance Level of Assistance  Bathing,Feeding,Dressing Bathing Assistance: Limited assistance Feeding assistance: Limited assistance Dressing Assistance: Limited assistance     Functional Limitations Info             SPECIAL CARE FACTORS FREQUENCY  PT (By licensed PT),OT (By licensed OT)     PT Frequency: 5 times per week OT Frequency: 5 times per week            Contractures Contractures Info: Not present    Additional Factors Info  Code Status,Allergies Code Status Info: Full Allergies Info: Shellfish, PCN           Current  Medications (11/07/2020):  This is the current hospital active medication list Current Facility-Administered Medications  Medication Dose Route Frequency Provider Last Rate Last Admin  . acetaminophen (TYLENOL) tablet 650 mg  650 mg Oral Q4H PRN Ivor Costa, MD       Or  . acetaminophen (TYLENOL) 160 MG/5ML solution 650 mg  650 mg Per Tube Q4H PRN Ivor Costa, MD       Or  . acetaminophen (TYLENOL) suppository 650 mg  650 mg Rectal Q4H PRN Ivor Costa, MD      . aspirin EC tablet 81 mg  81 mg Oral Daily Ivor Costa, MD   81 mg at 11/07/20 1109  . dextrose 50 % solution 50 mL  50 mL Intravenous PRN Ivor Costa, MD   50 mL at 11/06/20 0821  . diazoxide (PROGLYCEM) 50 MG/ML suspension 120 mg  3 mg/kg/day Oral Q12H Lorella Nimrod, MD   120 mg at 11/07/20 1108  . memantine (NAMENDA XR) 24 hr capsule 28 mg  28 mg Oral QHS Benita Gutter, RPH   28 mg at 11/06/20 2310   And  . donepezil (ARICEPT) tablet 10 mg  10 mg Oral QHS Benita Gutter, RPH   10 mg at 11/06/20 2310  . enoxaparin (LOVENOX) injection 40 mg  40 mg Subcutaneous Q2200 Benita Gutter, RPH   40  mg at 11/06/20 2310  . fenofibrate tablet 160 mg  160 mg Oral Daily Ivor Costa, MD   160 mg at 11/07/20 1109  . ferrous sulfate tablet 325 mg  325 mg Oral Daily Ivor Costa, MD   325 mg at 11/07/20 1110  . hydrALAZINE (APRESOLINE) injection 5 mg  5 mg Intravenous Q2H PRN Ivor Costa, MD      . magnesium gluconate (MAGONATE) tablet 250 mg  250 mg Oral Daily Benita Gutter, RPH   250 mg at 11/07/20 1110  . melatonin tablet 5 mg  5 mg Oral QHS Ivor Costa, MD   5 mg at 11/06/20 2310  . OLANZapine (ZYPREXA) tablet 5 mg  5 mg Oral QHS Ivor Costa, MD   5 mg at 11/06/20 2311  . ondansetron (ZOFRAN) injection 4 mg  4 mg Intravenous Q8H PRN Ivor Costa, MD      . rosuvastatin (CRESTOR) tablet 10 mg  10 mg Oral Daily Lorella Nimrod, MD   10 mg at 11/07/20 1109  . sertraline (ZOLOFT) tablet 25 mg  25 mg Oral Daily Ivor Costa, MD   25 mg at 11/07/20 1109      Discharge Medications: Please see discharge summary for a list of discharge medications.  Relevant Imaging Results:  Relevant Lab Results:   Additional Information SS# [id number]  Shelbie Hutching, RN

## 2020-11-08 DIAGNOSIS — R2990 Unspecified symptoms and signs involving the nervous system: Secondary | ICD-10-CM | POA: Diagnosis not present

## 2020-11-08 LAB — GLUCOSE, CAPILLARY
Glucose-Capillary: 128 mg/dL — ABNORMAL HIGH (ref 70–99)
Glucose-Capillary: 128 mg/dL — ABNORMAL HIGH (ref 70–99)
Glucose-Capillary: 100 mg/dL — ABNORMAL HIGH (ref 70–99)
Glucose-Capillary: 131 mg/dL — ABNORMAL HIGH (ref 70–99)
Glucose-Capillary: 128 mg/dL — ABNORMAL HIGH (ref 70–99)
Glucose-Capillary: 111 mg/dL — ABNORMAL HIGH (ref 70–99)
Glucose-Capillary: 125 mg/dL — ABNORMAL HIGH (ref 70–99)
Glucose-Capillary: 99 mg/dL (ref 70–99)
Glucose-Capillary: 132 mg/dL — ABNORMAL HIGH (ref 70–99)

## 2020-11-08 NOTE — Progress Notes (Signed)
Bsg is 118

## 2020-11-08 NOTE — Progress Notes (Signed)
Dr. Reesa Chew contacted regarding patient's home meds: olanzapine 5mg  and namzaric 28mg   Daughter requested that these be ordered for patient this hospitalization.

## 2020-11-08 NOTE — Progress Notes (Signed)
PROGRESS NOTE    Alexis Rodriguez  EXB:[id number] DOB: 28-Oct-1941 DOA: 11/04/2020 PCP: Perrin Maltese, MD   Brief Narrative: Taken from H&P. Alexis Rodriguez is a 79 y.o. female with medical history significant of hypertension, hyperlipidemia, dementia, iron deficiency anemia, CKD-3, who presents with strokelike symptoms.  Per her daughter, patient has strokelike symptoms for about 1 month. Symptoms have been going on intermittently, including confusion, blurry vision, slurred speech, slow response with staring off. No episode of fully loss of consciousness. No facial droop. Patient moves all extremities. Patient does not have chest pain, cough, shortness breath, fever or chills. No nausea, vomiting, diarrhea, abdominal pain, symptoms of UTI.   She was also found to have hypoglycemia without any prior diagnosis of diabetes or being on any antidiabetic medications. CT head and MRI without any acute infarct.  Continue to drop blood glucose level.  Requiring D10. Neurology was consulted-recommending stroke work-up and EEG, which was negative. Persistent hyperinsulinism hypoglycemia, rest of the labs pending. Responded well to glucagon and octreotide in the beginning, later maintaining blood glucose on diazoxide.  Subjective: Patient was just finished with her breakfast when seen today.  No new complaint.  Denies any pain.  Assessment & Plan:   Principal Problem:   Stroke-like symptoms Active Problems:   HTN (hypertension)   Hypoglycemia   Dementia (HCC)   HLD (hyperlipidemia)   Iron deficiency anemia   CKD (chronic kidney disease), stage IIIa   Depression   Pressure injury of skin  Stroke right symptoms/persistent intermittent hypoglycemia.  Imaging is negative for any acute infarct.  Echocardiogram with normal EF and grade 1 diastolic dysfunction.  A1c of 4.7.  EEG without any epileptiform changes. Very vague symptoms might be due to intermittent hypoglycemia. Intermittent symptoms for 1  month, where she intermittently seems more confused, staring and sometimes sweating.  Per daughter she is having good p.o. intake and ready consistent with her routine. Patient has underlying dementia and is oriented to self only at baseline. Patient responded well to glucagon challenge and later blood sugar increased above 200 with octreotide subcu. had curbside discussion with endocrinology and our pharmacist and decided to start her on diazoxide.  Able to maintain her blood glucose level so far.  CBG remained between 100-160 over the past 24-hour. Insulin and C-peptide elevated. Sulfonylurea , pro insuline, insulin antibodies levels pending. A.m. cortisol mildly elevated. Pancreatic protocol MRI without any significant abnormality. 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 -Continue with twice daily dose of diazoxide. -Continue CBG monitoring. -Patient needs an endocrinology consult-Dr. Solum's office is closed for the holidays.  Physical deconditioning.  PT recommending SNF.  Family is agreeable. -TOC started working on it-most likely will happen after the holidays.  Pyuria.  No urinary symptoms.  Urine cultures are pending.Per daughter received recent 3 day course of cipro.by PCP for concern of UTI. - urine cultures-multiple species. -No need for antibiotics at this time.  Hypertension.  She was not on any antihypertensives at home.  Blood pressure within goal today. -Continue to monitor. -Hydralazine as needed for systolic above 102.  Depression.  No acute concern. -Continue home dose of Zoloft and olanzapine.  Dementia ([place]) -Continue Namzaric  HLD (hyperlipidemia) -Continue Tricor  Iron deficiency anemia: Hemoglobin 13.7 -Continue iron supplement  CKD (chronic kidney disease), stage IIIa: Stable -Follow-up with BMP  Objective: Vitals:   11/07/20 2024 11/07/20 2354 11/08/20 0457 11/08/20 0813  BP: (!) 140/57 134/60 (!) 153/59 (!) 143/60   Pulse: 65 69 (!)  59 (!) 59  Resp: 18 20 18 16   Temp: 97.8 F (36.6 C) (!) 97.5 F (36.4 C) 97.8 F (36.6 C) 97.8 F (36.6 C)  TempSrc: Oral Oral    SpO2: 99% 98% 99% 98%  Weight:      Height:        Intake/Output Summary (Last 24 hours) at 11/08/2020 0819 Last data filed at 11/07/2020 1400 Gross per 24 hour  Intake --  Output 450 ml  Net -450 ml   Filed Weights   11/04/20 1340  Weight: 78.7 kg    Examination:  General.  Well-developed elderly lady, in no acute distress. Pulmonary.  Lungs clear bilaterally, normal respiratory effort. CV.  Regular rate and rhythm, no JVD, rub or murmur. Abdomen.  Soft, nontender, nondistended, BS positive. CNS.  Alert and oriented x3.  No focal neurologic deficit. Extremities.  No edema, no cyanosis, pulses intact and symmetrical. Psychiatry.  Judgment and insight appears impaired.  DVT prophylaxis: Lovenox Code Status: Full Family Communication: Discussed with daughter on phone. Disposition Plan:  Status is: Inpatient  Remains inpatient appropriate because:Inpatient level of care appropriate due to severity of illness   Dispo: The patient is from: Home              Anticipated d/c is to: SNF              Anticipated d/c date is: 1 day              Patient currently is medically stable.  TOC is looking for placement.   Consultants:   Neurology  Procedures:  Antimicrobials:   Data Reviewed: I have personally reviewed following labs and imaging studies  CBC: Recent Labs  Lab 11/04/20 1345 11/07/20 0608  WBC 9.5 8.9  NEUTROABS 6.3  --   HGB 13.7 13.2  HCT 41.5 38.8  MCV 92.2 89.6  PLT 306 850   Basic Metabolic Panel: Recent Labs  Lab 11/04/20 1345 11/07/20 0608  NA 137 128*  K 4.1 4.0  CL 100 92*  CO2 28 26  GLUCOSE 39* 201*  BUN 14 13  CREATININE 1.20* 1.07*  CALCIUM 9.2 8.9   GFR: Estimated Creatinine Clearance: 41.6 mL/min (A) (by C-G formula based on SCr of 1.07 mg/dL (H)). Liver Function  Tests: Recent Labs  Lab 11/04/20 1345  AST 47*  ALT 25  ALKPHOS 35*  BILITOT 0.5  PROT 7.8  ALBUMIN 3.8   No results for input(s): LIPASE, AMYLASE in the last 168 hours. No results for input(s): AMMONIA in the last 168 hours. Coagulation Profile: Recent Labs  Lab 11/04/20 1345  INR 1.1   Cardiac Enzymes: No results for input(s): CKTOTAL, CKMB, CKMBINDEX, TROPONINI in the last 168 hours. BNP (last 3 results) No results for input(s): PROBNP in the last 8760 hours. HbA1C: No results for input(s): HGBA1C in the last 72 hours. CBG: Recent Labs  Lab 11/07/20 2054 11/07/20 2242 11/08/20 0120 11/08/20 0457 11/08/20 0814  GLUCAP 140* 128* 128* 128* 100*   Lipid Profile: No results for input(s): CHOL, HDL, LDLCALC, TRIG, CHOLHDL, LDLDIRECT in the last 72 hours. Thyroid Function Tests: No results for input(s): TSH, T4TOTAL, FREET4, T3FREE, THYROIDAB in the last 72 hours. Anemia Panel: No results for input(s): VITAMINB12, FOLATE, FERRITIN, TIBC, IRON, RETICCTPCT in the last 72 hours. Sepsis Labs: No results for input(s): PROCALCITON, LATICACIDVEN in the last 168 hours.  Recent Results (from the past 240 hour(s))  Resp Panel by RT-PCR (Flu A&B, Covid) Nasopharyngeal Swab  Status: None   Collection Time: 11/04/20  3:14 PM   Specimen: Nasopharyngeal Swab; Nasopharyngeal(NP) swabs in vial transport medium  Result Value Ref Range Status   SARS Coronavirus 2 by RT PCR NEGATIVE NEGATIVE Final    Comment: (NOTE) SARS-CoV-2 target nucleic acids are NOT DETECTED.  The SARS-CoV-2 RNA is generally detectable in upper respiratory specimens during the acute phase of infection. The lowest concentration of SARS-CoV-2 viral copies this assay can detect is 138 copies/mL. A negative result does not preclude SARS-Cov-2 infection and should not be used as the sole basis for treatment or other patient management decisions. A negative result may occur with  improper specimen  collection/handling, submission of specimen other than nasopharyngeal swab, presence of viral mutation(s) within the areas targeted by this assay, and inadequate number of viral copies(<138 copies/mL). A negative result must be combined with clinical observations, patient history, and epidemiological information. The expected result is Negative.  Fact Sheet for Patients:  EntrepreneurPulse.com.au  Fact Sheet for Healthcare Providers:  IncredibleEmployment.be  This test is no t yet approved or cleared by the [place] FDA and  has been authorized for detection and/or diagnosis of SARS-CoV-2 by FDA under an Emergency Use Authorization (EUA). This EUA will remain  in effect (meaning this test can be used) for the duration of the COVID-19 declaration under Section 564(b)(1) of the Act, 21 U.S.C.section 360bbb-3(b)(1), unless the authorization is terminated  or revoked sooner.       Influenza A by PCR NEGATIVE NEGATIVE Final   Influenza B by PCR NEGATIVE NEGATIVE Final    Comment: (NOTE) The Xpert Xpress SARS-CoV-2/FLU/RSV plus assay is intended as an aid in the diagnosis of influenza from Nasopharyngeal swab specimens and should not be used as a sole basis for treatment. Nasal washings and aspirates are unacceptable for Xpert Xpress SARS-CoV-2/FLU/RSV testing.  Fact Sheet for Patients: EntrepreneurPulse.com.au  Fact Sheet for Healthcare Providers: IncredibleEmployment.be  This test is not yet approved or cleared by the [place] FDA and has been authorized for detection and/or diagnosis of SARS-CoV-2 by FDA under an Emergency Use Authorization (EUA). This EUA will remain in effect (meaning this test can be used) for the duration of the COVID-19 declaration under Section 564(b)(1) of the Act, 21 U.S.C. section 360bbb-3(b)(1), unless the authorization is terminated or revoked.  Performed at [facility], [address] [place], [place] [address]   Urine Culture     Status: Abnormal   Collection Time: 11/04/20  3:42 PM   Specimen: Urine, Random  Result Value Ref Range Status   Specimen Description   Final    URINE, RANDOM Performed at [facility], [address], [place], [place] [address]    Special Requests   Final    NONE Performed at [facility], [address] [place], [place] [address]    Culture MULTIPLE SPECIES PRESENT, SUGGEST RECOLLECTION (A)  Final   Report Status 11/06/2020 FINAL  Final     Radiology Studies: EEG  Result Date: 11/06/2020 Alexis Goodell, MD     11/06/2020  7:50 PM ELECTROENCEPHALOGRAM REPORT Patient: Bayla Mcgovern       Room #: 120A-AA EEG No. ID: [id number] Age: 79 y.o.        Sex: female Requesting Physician: Reesa Chew Report Date:  11/06/2020       Interpreting Physician: Alexis Goodell History: Kamala Kolton is an 79 y.o. female with episodes of unresponsiveness Medications: ASA, Aricept, Melatonin, Namenda, Zyprexa, Crestor, Zoloft Conditions of Recording:  This is a 21 channel routine scalp EEG performed with bipolar and monopolar montages arranged in accordance to the international 10/20 system of electrode placement. One channel was dedicated to EKG recording. The patient is in the awake state. Description:  Artifact is prominent during the recording often obscuring the background rhythm. When able to be visualized the background is low voltage and continuous.  Although no posterior background rhythm could be evaluated there was no evidence of epileptiform activity. The patient does not drowse or sleep. Hyperventilation was not performed. Intermittent photic stimulation was performed but failed to illicit any change in the tracing. IMPRESSION: This is a technically difficult awake electroencephalogram secondary to artifact.  No epileptiform activity is noted.  Alexis Goodell, MD Neurology 11/06/2020, 7:47 PM    Scheduled  Meds: . aspirin EC  81 mg Oral Daily  . diazoxide  3 mg/kg/day Oral Q12H  . memantine  28 mg Oral QHS   And  . donepezil  10 mg Oral QHS  . enoxaparin (LOVENOX) injection  40 mg Subcutaneous Q2200  . fenofibrate  160 mg Oral Daily  . ferrous sulfate  325 mg Oral Daily  . magnesium gluconate  250 mg Oral Daily  . melatonin  5 mg Oral QHS  . OLANZapine  5 mg Oral QHS  . rosuvastatin  10 mg Oral Daily  . sertraline  25 mg Oral Daily   Continuous Infusions:    LOS: 3 days   Time spent: 30 minutes.  Lorella Nimrod, MD [facility]  If 7PM-7AM, please contact night-coverage [email or web address]  11/08/2020, 8:19 AM   This record has been created using Systems analyst. Errors have been sought and corrected,but may not always be located. Such creation errors do not reflect on the standard of care.

## 2020-11-09 ENCOUNTER — Inpatient Hospital Stay: Payer: Medicare HMO

## 2020-11-09 DIAGNOSIS — R2990 Unspecified symptoms and signs involving the nervous system: Secondary | ICD-10-CM | POA: Diagnosis not present

## 2020-11-09 LAB — GLUCOSE, CAPILLARY
Glucose-Capillary: 96 mg/dL (ref 70–99)
Glucose-Capillary: 89 mg/dL (ref 70–99)
Glucose-Capillary: 101 mg/dL — ABNORMAL HIGH (ref 70–99)
Glucose-Capillary: 91 mg/dL (ref 70–99)
Glucose-Capillary: 119 mg/dL — ABNORMAL HIGH (ref 70–99)
Glucose-Capillary: 93 mg/dL (ref 70–99)
Glucose-Capillary: 92 mg/dL (ref 70–99)

## 2020-11-09 MED ORDER — DIAZOXIDE 50 MG/ML PO SUSP
3.0000 mg/kg/d | Freq: Three times a day (TID) | ORAL | Status: DC
  Administered 2020-11-09 – 2020-11-10 (×3): 80 mg via ORAL
  Filled 2020-11-09 (×5): qty 1.6

## 2020-11-09 NOTE — Progress Notes (Signed)
PROGRESS NOTE    Alexis Rodriguez  LZJ:[id number] DOB: 09-07-1941 DOA: 11/04/2020 PCP: Perrin Maltese, MD   Brief Narrative: Taken from H&P. Alexis Rodriguez is a 79 y.o. female with medical history significant of hypertension, hyperlipidemia, dementia, iron deficiency anemia, CKD-3, who presents with strokelike symptoms.  Per her daughter, patient has strokelike symptoms for about 1 month. Symptoms have been going on intermittently, including confusion, blurry vision, slurred speech, slow response with staring off. No episode of fully loss of consciousness. No facial droop. Patient moves all extremities. Patient does not have chest pain, cough, shortness breath, fever or chills. No nausea, vomiting, diarrhea, abdominal pain, symptoms of UTI.   She was also found to have hypoglycemia without any prior diagnosis of diabetes or being on any antidiabetic medications. CT head and MRI without any acute infarct.  Continue to drop blood glucose level.  Requiring D10. Neurology was consulted-recommending stroke work-up and EEG, which was negative. Persistent hyperinsulinism hypoglycemia, rest of the labs pending. Responded well to glucagon and octreotide in the beginning, later maintaining blood glucose on diazoxide.  Subjective: Patient was eating breakfast with assistance when seen today.  Per nursing concern she cannot feed herself which is new from her baseline.  Also some nursing concern of weak cough.  No other complaints.  Assessment & Plan:   Principal Problem:   Stroke-like symptoms Active Problems:   HTN (hypertension)   Hypoglycemia   Dementia (HCC)   HLD (hyperlipidemia)   Iron deficiency anemia   CKD (chronic kidney disease), stage IIIa   Depression   Pressure injury of skin  Stroke right symptoms/persistent intermittent hypoglycemia.  Imaging is negative for any acute infarct.  Echocardiogram with normal EF and grade 1 diastolic dysfunction.  A1c of 4.7.  EEG without any epileptiform  changes. Very vague symptoms might be due to intermittent hypoglycemia. Intermittent symptoms for 1 month, where she intermittently seems more confused, staring and sometimes sweating.  Per daughter she is having good p.o. intake and ready consistent with her routine. Patient has underlying dementia and is oriented to self only at baseline. Patient responded well to glucagon challenge and later blood sugar increased above 200 with octreotide subcu. had curbside discussion with endocrinology and our pharmacist and decided to start her on diazoxide.  Able to maintain her blood glucose level so far.  CBG remained between 110-89 over the past 24-hour. Insulin and C-peptide elevated. Sulfonylurea , pro insuline, insulin antibodies levels pending. A.m. cortisol mildly elevated. Pancreatic protocol MRI without any significant abnormality. 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 -Increase daily dose of diazoxide to every 8 hourly as recommended -Continue CBG monitoring. -Patient needs an endocrinology consult-Dr. Solum's office is closed for the holidays.  Cough.  Per nursing concern that she developed new cough over the past 24-hour.  Remained afebrile.  Concern of aspiration. -Chest x-ray. -Speech and swallow evaluation.  Physical deconditioning.  PT recommending SNF.  Family is agreeable. -TOC started working on it-most likely will happen after the holidays.  Pyuria.  No urinary symptoms.  Urine cultures are pending.Per daughter received recent 3 day course of cipro.by PCP for concern of UTI. - urine cultures-multiple species. -No need for antibiotics at this time.  Hypertension.  She was not on any antihypertensives at home.  Blood pressure within goal today. -Continue to monitor. -Hydralazine as needed for systolic above 024.  Depression.  No acute concern. -Continue home dose of Zoloft and olanzapine.  Dementia ([place]) -Continue Namzaric  HLD  (  hyperlipidemia) -Continue Tricor  Iron deficiency anemia: Hemoglobin 13.7 -Continue iron supplement  CKD (chronic kidney disease), stage IIIa: Stable -Follow-up with BMP  Objective: Vitals:   11/08/20 1500 11/08/20 2046 11/09/20 0547 11/09/20 0625  BP: 140/67 (!) 146/41 (!) 148/36 (!) 136/31  Pulse: 60 (!) 54 (!) 48 (!) 46  Resp: 17 15  16   Temp: 97.8 F (36.6 C) (!) 97.4 F (36.3 C) 99.1 F (37.3 C) 98 F (36.7 C)  TempSrc:  Oral Oral   SpO2: 97% 95% 98% 96%  Weight:      Height:        Intake/Output Summary (Last 24 hours) at 11/09/2020 0749 Last data filed at 11/08/2020 1300 Gross per 24 hour  Intake 360 ml  Output 600 ml  Net -240 ml   Filed Weights   11/04/20 1340  Weight: 78.7 kg    Examination:  General.  Pleasant elderly lady, in no acute distress. Pulmonary.  Lungs clear bilaterally, normal respiratory effort. CV.  Regular rate and rhythm, no JVD, rub or murmur. Abdomen.  Soft, nontender, nondistended, BS positive. CNS.  Alert and oriented x3.  No focal neurologic deficit. Extremities.  No edema, no cyanosis, pulses intact and symmetrical. Psychiatry.  Judgment and insight appears impaired.  DVT prophylaxis: Lovenox Code Status: Full Family Communication: Discussed with daughter on phone. Disposition Plan:  Status is: Inpatient  Remains inpatient appropriate because:Inpatient level of care appropriate due to severity of illness   Dispo: The patient is from: Home              Anticipated d/c is to: SNF              Anticipated d/c date is: 1 day              Patient currently is medically stable.  TOC is looking for placement.   Consultants:   Neurology  Procedures:  Antimicrobials:   Data Reviewed: I have personally reviewed following labs and imaging studies  CBC: Recent Labs  Lab 11/04/20 1345 11/07/20 0608  WBC 9.5 8.9  NEUTROABS 6.3  --   HGB 13.7 13.2  HCT 41.5 38.8  MCV 92.2 89.6  PLT 306 353   Basic Metabolic  Panel: Recent Labs  Lab 11/04/20 1345 11/07/20 0608  NA 137 128*  K 4.1 4.0  CL 100 92*  CO2 28 26  GLUCOSE 39* 201*  BUN 14 13  CREATININE 1.20* 1.07*  CALCIUM 9.2 8.9   GFR: Estimated Creatinine Clearance: 41.6 mL/min (A) (by C-G formula based on SCr of 1.07 mg/dL (H)). Liver Function Tests: Recent Labs  Lab 11/04/20 1345  AST 47*  ALT 25  ALKPHOS 35*  BILITOT 0.5  PROT 7.8  ALBUMIN 3.8   No results for input(s): LIPASE, AMYLASE in the last 168 hours. No results for input(s): AMMONIA in the last 168 hours. Coagulation Profile: Recent Labs  Lab 11/04/20 1345  INR 1.1   Cardiac Enzymes: No results for input(s): CKTOTAL, CKMB, CKMBINDEX, TROPONINI in the last 168 hours. BNP (last 3 results) No results for input(s): PROBNP in the last 8760 hours. HbA1C: No results for input(s): HGBA1C in the last 72 hours. CBG: Recent Labs  Lab 11/08/20 1621 11/08/20 1835 11/08/20 2044 11/09/20 0228 11/09/20 0551  GLUCAP 125* 99 132* 96 89   Lipid Profile: No results for input(s): CHOL, HDL, LDLCALC, TRIG, CHOLHDL, LDLDIRECT in the last 72 hours. Thyroid Function Tests: No results for input(s): TSH, T4TOTAL, FREET4, T3FREE, THYROIDAB in  the last 72 hours. Anemia Panel: No results for input(s): VITAMINB12, FOLATE, FERRITIN, TIBC, IRON, RETICCTPCT in the last 72 hours. Sepsis Labs: No results for input(s): PROCALCITON, LATICACIDVEN in the last 168 hours.  Recent Results (from the past 240 hour(s))  Resp Panel by RT-PCR (Flu A&B, Covid) Nasopharyngeal Swab     Status: None   Collection Time: 11/04/20  3:14 PM   Specimen: Nasopharyngeal Swab; Nasopharyngeal(NP) swabs in vial transport medium  Result Value Ref Range Status   SARS Coronavirus 2 by RT PCR NEGATIVE NEGATIVE Final    Comment: (NOTE) SARS-CoV-2 target nucleic acids are NOT DETECTED.  The SARS-CoV-2 RNA is generally detectable in upper respiratory specimens during the acute phase of infection. The  lowest concentration of SARS-CoV-2 viral copies this assay can detect is 138 copies/mL. A negative result does not preclude SARS-Cov-2 infection and should not be used as the sole basis for treatment or other patient management decisions. A negative result may occur with  improper specimen collection/handling, submission of specimen other than nasopharyngeal swab, presence of viral mutation(s) within the areas targeted by this assay, and inadequate number of viral copies(<138 copies/mL). A negative result must be combined with clinical observations, patient history, and epidemiological information. The expected result is Negative.  Fact Sheet for Patients:  EntrepreneurPulse.com.au  Fact Sheet for Healthcare Providers:  IncredibleEmployment.be  This test is no t yet approved or cleared by the [place] FDA and  has been authorized for detection and/or diagnosis of SARS-CoV-2 by FDA under an Emergency Use Authorization (EUA). This EUA will remain  in effect (meaning this test can be used) for the duration of the COVID-19 declaration under Section 564(b)(1) of the Act, 21 U.S.C.section 360bbb-3(b)(1), unless the authorization is terminated  or revoked sooner.       Influenza A by PCR NEGATIVE NEGATIVE Final   Influenza B by PCR NEGATIVE NEGATIVE Final    Comment: (NOTE) The Xpert Xpress SARS-CoV-2/FLU/RSV plus assay is intended as an aid in the diagnosis of influenza from Nasopharyngeal swab specimens and should not be used as a sole basis for treatment. Nasal washings and aspirates are unacceptable for Xpert Xpress SARS-CoV-2/FLU/RSV testing.  Fact Sheet for Patients: EntrepreneurPulse.com.au  Fact Sheet for Healthcare Providers: IncredibleEmployment.be  This test is not yet approved or cleared by the [place] FDA and has been authorized for detection and/or diagnosis of SARS-CoV-2 by FDA under  an Emergency Use Authorization (EUA). This EUA will remain in effect (meaning this test can be used) for the duration of the COVID-19 declaration under Section 564(b)(1) of the Act, 21 U.S.C. section 360bbb-3(b)(1), unless the authorization is terminated or revoked.  Performed at [facility], [address] [place], [place] [address]   Urine Culture     Status: Abnormal   Collection Time: 11/04/20  3:42 PM   Specimen: Urine, Random  Result Value Ref Range Status   Specimen Description   Final    URINE, RANDOM Performed at [facility], [address] [place], [place] [address]    Special Requests   Final    NONE Performed at [facility], [address] [place], [place] [address]    Culture MULTIPLE SPECIES PRESENT, SUGGEST RECOLLECTION (A)  Final   Report Status 11/06/2020 FINAL  Final     Radiology Studies: No results found.  Scheduled Meds: . aspirin EC  81 mg Oral Daily  . diazoxide  3 mg/kg/day Oral Q8H  . memantine  28 mg Oral QHS  And  . donepezil  10 mg Oral QHS  . enoxaparin (LOVENOX) injection  40 mg Subcutaneous Q2200  . fenofibrate  160 mg Oral Daily  . ferrous sulfate  325 mg Oral Daily  . magnesium gluconate  250 mg Oral Daily  . melatonin  5 mg Oral QHS  . OLANZapine  5 mg Oral QHS  . rosuvastatin  10 mg Oral Daily  . sertraline  25 mg Oral Daily   Continuous Infusions:    LOS: 4 days   Time spent: 30 minutes.  Lorella Nimrod, MD [facility]  If 7PM-7AM, please contact night-coverage [email or web address]  11/09/2020, 7:49 AM   This record has been created using Systems analyst. Errors have been sought and corrected,but may not always be located. Such creation errors do not reflect on the standard of care.

## 2020-11-09 NOTE — Evaluation (Addendum)
Clinical/Bedside Swallow Evaluation Patient Details  Name: Alexis Rodriguez MRN: [id number] Date of Birth: 1941/03/21  Today's Date: 11/09/2020 Time: SLP Start Time (ACUTE ONLY): 7829 SLP Stop Time (ACUTE ONLY): 1515 SLP Time Calculation (min) (ACUTE ONLY): 60 min  Past Medical History:  Past Medical History:  Diagnosis Date  . Dementia ([place])   . Hypertension   . Pancreatitis    Past Surgical History:  Past Surgical History:  Procedure Laterality Date  . CHOLECYSTECTOMY     HPI:  Pt is a 79 y.o. female with medical history significant of hypertension, hyperlipidemia, Dementia, iron deficiency anemia, CKD-3, who presents with strokelike symptoms. Per her daughter, patient has strokelike symptoms for about 1 month. Symptoms have been going on intermittently, including confusion, blurry vision, slurred speech, slow response with staring off. No episode of fully loss of consciousness. No facial droop. Patient moves all extremities. Patient does not have chest pain, cough, shortness breath, fever or chills. No nausea, vomiting, diarrhea. NSG and Daughter have noted throat clearing in the room.  MRI: "1. No acute intracranial abnormality. 2. Moderately advanced temporal lobe predominant cerebral atrophy  with chronic small vessel ischemic disease. MR of Abd.: included small to moderate-sized type 1 hiatal hernia.   Assessment / Plan / Recommendation Clinical Impression  Pt appears to present w/ oropharyngeal phase dysphagia potentially in light of declined Cognitive status; any decline can impact overall awareness/engagement and safety during po tasks which increases risk for aspiration, choking. Pt's risk for aspiration is reduced when following general aspiration precautions and given feeding assistance w/ a modified diet consistency. She requires verbal/visual cues for orientation to bolus presentation, and during feeding support of po's. She was able to hold Cup to assist w/ drinking given support.   Pt demonstrated overt clinical s/s of aspiration w/ trials of thin liquids despite aspiration precautions. This was c/b immediate and delayed, overt coughing -- suspect delayed pharyngeal swallow initiation. She was then presented w/ and consumed trials of Nectar consistency liquids and purees w/ No immediate/delayed, overt clinical s/s of aspiration; no decline in vocal quality during few verbalizations, no cough, and no decline in respiratory status during/post trials. Oral phase was adequate for bolus management and oral clearing of the boluses given. Pt also appeared to tolerate Single ice chips w/ no overt s/s of aspiration noted. OM Exam was cursory/observation, but No unilateral weakness noted and bolus management was adequate w/ lingual/labial movements appropriate. Speech clear.  D/t pt's declined Cognitive status baseline, her risk for aspiration, and overt clinical s/s, recommend initiation of the dysphagia level 1(puree) w/ Nectar liquids; aspiration precautions; reduce Distractions during meals and assist during meals. Pills Crushed vs Whole in Puree for safer swallowing. Pt may have ice chips in between meals post oral care for comfort of liquid. Recommend follow w/ Palliative Care for [place]. ST services will follow pt for MBSS next 1-2 days to objectively assess swallow function; diet consistency. Precautions posted in room. NSG/MD updated. SLP Visit Diagnosis: Dysphagia, oropharyngeal phase (R13.12) (baseline Dementia)    Aspiration Risk  Mild aspiration risk;Moderate aspiration risk;Risk for inadequate nutrition/hydration    Diet Recommendation  Dysphagia level 1(puree) w/ Nectar liquids; aspiration precautions; feeding support and monitoring at all meals. Reduce distractions.  Medication Administration: Whole meds with puree (vs Crushed in Puree for safer swallowing)    Other  Recommendations Recommended Consults:  (Palliative Care consult for [place]; Dietician f/u) Oral Care  Recommendations: Oral care BID;Oral care before and after PO;Staff/trained caregiver to provide oral  care Other Recommendations: Order thickener from pharmacy;Prohibited food (jello, ice cream, thin soups);Remove water pitcher;Have oral suction available   Follow up Recommendations  (TBD)      Frequency and Duration min 3x week  2 weeks       Prognosis Prognosis for Safe Diet Advancement: Fair Barriers to Reach Goals: Cognitive deficits;Severity of deficits;Time post onset      Swallow Study   General Date of Onset: 11/04/20 HPI: Pt is a 79 y.o. female with medical history significant of hypertension, hyperlipidemia, dementia, iron deficiency anemia, CKD-3, who presents with strokelike symptoms. Per her daughter, patient has strokelike symptoms for about 1 month. Symptoms have been going on intermittently, including confusion, blurry vision, slurred speech, slow response with staring off. No episode of fully loss of consciousness. No facial droop. Patient moves all extremities. Patient does not have chest pain, cough, shortness breath, fever or chills. No nausea, vomiting, diarrhea. MRI: "1. No acute intracranial abnormality. 2. Moderately advanced temporal lobe predominant cerebral atrophy  with chronic small vessel ischemic disease. MR of Abd.: included small to moderate-sized type 1 hiatal hernia. Type of Study: Bedside Swallow Evaluation Previous Swallow Assessment: none reported Diet Prior to this Study: Regular;Thin liquids Temperature Spikes Noted: No (wbc 8.9) Respiratory Status: Room air History of Recent Intubation: No Behavior/Cognition: Alert;Cooperative;Pleasant mood;Confused;Distractible;Requires cueing (baseline Dementia) Oral Cavity Assessment: Within Functional Limits Oral Care Completed by SLP: Yes (attempted; pt closed mouth) Oral Cavity - Dentition: Missing dentition (has front lower dentition) Vision:  (n/a) Self-Feeding Abilities: Able to feed self;Needs  assist;Needs set up;Total assist Patient Positioning: Upright in bed (needed positioning) Baseline Vocal Quality: Normal Volitional Cough: Cognitively unable to elicit Volitional Swallow: Unable to elicit    Oral/Motor/Sensory Function Overall Oral Motor/Sensory Function: Within functional limits (appeared w/ bolus management, using straw)   Ice Chips Ice chips: Within functional limits Presentation: Spoon (fed; 5 trials)   Thin Liquid Thin Liquid: Impaired Presentation: Cup;Self Fed;Straw (supported; 2-3 trials via each method) Oral Phase Impairments:  (adequate) Pharyngeal  Phase Impairments: Suspected delayed Swallow;Multiple swallows;Cough - Immediate;Cough - Delayed    Nectar Thick Nectar Thick Liquid: Within functional limits (grossly) Presentation: Cup;Self Fed;Straw (supported: ~3 ozs total)   Honey Thick Honey Thick Liquid: Not tested   Puree Puree: Within functional limits Presentation: Spoon (fed; 10 trials) Other Comments: coughing noted x1 but this did not repeat itself   Solid     Solid: Not tested       [name], [place], CCC-SLP Speech Language Pathologist Rehab Services [phone] [facility] 11/09/2020,4:08 PM

## 2020-11-10 ENCOUNTER — Inpatient Hospital Stay: Payer: Medicare HMO

## 2020-11-10 DIAGNOSIS — I441 Atrioventricular block, second degree: Secondary | ICD-10-CM

## 2020-11-10 DIAGNOSIS — I248 Other forms of acute ischemic heart disease: Secondary | ICD-10-CM

## 2020-11-10 DIAGNOSIS — I4430 Unspecified atrioventricular block: Secondary | ICD-10-CM

## 2020-11-10 DIAGNOSIS — R2990 Unspecified symptoms and signs involving the nervous system: Secondary | ICD-10-CM | POA: Diagnosis not present

## 2020-11-10 LAB — URINALYSIS, ROUTINE W REFLEX MICROSCOPIC
Bilirubin Urine: NEGATIVE
Glucose, UA: NEGATIVE mg/dL
Hgb urine dipstick: NEGATIVE
Ketones, ur: NEGATIVE mg/dL
Leukocytes,Ua: NEGATIVE
Nitrite: NEGATIVE
Protein, ur: NEGATIVE mg/dL
Specific Gravity, Urine: 1.01 (ref 1.005–1.030)
pH: 5 (ref 5.0–8.0)

## 2020-11-10 LAB — TROPONIN I (HIGH SENSITIVITY)
Troponin I (High Sensitivity): 120 ng/L (ref <18)
Troponin I (High Sensitivity): 174 ng/L (ref <18)
Troponin I (High Sensitivity): 145 ng/L (ref <18)

## 2020-11-10 LAB — BASIC METABOLIC PANEL
Anion gap: 9 (ref 5–15)
Anion gap: 10 (ref 5–15)
Anion gap: 12 (ref 5–15)
BUN: 50 mg/dL — ABNORMAL HIGH (ref 8–23)
BUN: 48 mg/dL — ABNORMAL HIGH (ref 8–23)
BUN: 50 mg/dL — ABNORMAL HIGH (ref 8–23)
CO2: 23 mmol/L (ref 22–32)
CO2: 23 mmol/L (ref 22–32)
CO2: 22 mmol/L (ref 22–32)
Calcium: 8.2 mg/dL — ABNORMAL LOW (ref 8.9–10.3)
Calcium: 8.3 mg/dL — ABNORMAL LOW (ref 8.9–10.3)
Calcium: 8.2 mg/dL — ABNORMAL LOW (ref 8.9–10.3)
Chloride: 94 mmol/L — ABNORMAL LOW (ref 98–111)
Chloride: 96 mmol/L — ABNORMAL LOW (ref 98–111)
Chloride: 94 mmol/L — ABNORMAL LOW (ref 98–111)
Creatinine, Ser: 2.19 mg/dL — ABNORMAL HIGH (ref 0.44–1.00)
Creatinine, Ser: 1.96 mg/dL — ABNORMAL HIGH (ref 0.44–1.00)
Creatinine, Ser: 2.03 mg/dL — ABNORMAL HIGH (ref 0.44–1.00)
GFR, Estimated: 22 mL/min — ABNORMAL LOW (ref >60)
GFR, Estimated: 25 mL/min — ABNORMAL LOW (ref >60)
GFR, Estimated: 24 mL/min — ABNORMAL LOW (ref >60)
Glucose, Bld: 85 mg/dL (ref 70–99)
Glucose, Bld: 160 mg/dL — ABNORMAL HIGH (ref 70–99)
Glucose, Bld: 199 mg/dL — ABNORMAL HIGH (ref 70–99)
Potassium: 3.9 mmol/L (ref 3.5–5.1)
Potassium: 4.4 mmol/L (ref 3.5–5.1)
Potassium: 4.5 mmol/L (ref 3.5–5.1)
Sodium: 126 mmol/L — ABNORMAL LOW (ref 135–145)
Sodium: 129 mmol/L — ABNORMAL LOW (ref 135–145)
Sodium: 128 mmol/L — ABNORMAL LOW (ref 135–145)

## 2020-11-10 LAB — COMPREHENSIVE METABOLIC PANEL
ALT: 54 U/L — ABNORMAL HIGH (ref 0–44)
AST: 161 U/L — ABNORMAL HIGH (ref 15–41)
Albumin: 3.4 g/dL — ABNORMAL LOW (ref 3.5–5.0)
Alkaline Phosphatase: 30 U/L — ABNORMAL LOW (ref 38–126)
Anion gap: 12 (ref 5–15)
BUN: 52 mg/dL — ABNORMAL HIGH (ref 8–23)
CO2: 24 mmol/L (ref 22–32)
Calcium: 8.9 mg/dL (ref 8.9–10.3)
Chloride: 93 mmol/L — ABNORMAL LOW (ref 98–111)
Creatinine, Ser: 2.59 mg/dL — ABNORMAL HIGH (ref 0.44–1.00)
GFR, Estimated: 18 mL/min — ABNORMAL LOW (ref >60)
Glucose, Bld: 108 mg/dL — ABNORMAL HIGH (ref 70–99)
Potassium: 4.4 mmol/L (ref 3.5–5.1)
Sodium: 129 mmol/L — ABNORMAL LOW (ref 135–145)
Total Bilirubin: 0.6 mg/dL (ref 0.3–1.2)
Total Protein: 6.9 g/dL (ref 6.5–8.1)

## 2020-11-10 LAB — CBC WITH DIFFERENTIAL/PLATELET
Abs Immature Granulocytes: 0.02 10*3/uL (ref 0.00–0.07)
Basophils Absolute: 0 10*3/uL (ref 0.0–0.1)
Basophils Relative: 1 %
Eosinophils Absolute: 0 10*3/uL (ref 0.0–0.5)
Eosinophils Relative: 0 %
HCT: 39.2 % (ref 36.0–46.0)
Hemoglobin: 13 g/dL (ref 12.0–15.0)
Immature Granulocytes: 0 %
Lymphocytes Relative: 18 %
Lymphs Abs: 1 10*3/uL (ref 0.7–4.0)
MCH: 29.8 pg (ref 26.0–34.0)
MCHC: 33.2 g/dL (ref 30.0–36.0)
MCV: 89.9 fL (ref 80.0–100.0)
Monocytes Absolute: 0.9 10*3/uL (ref 0.1–1.0)
Monocytes Relative: 16 %
Neutro Abs: 3.7 10*3/uL (ref 1.7–7.7)
Neutrophils Relative %: 65 %
Platelets: 241 10*3/uL (ref 150–400)
RBC: 4.36 MIL/uL (ref 3.87–5.11)
RDW: 13.2 % (ref 11.5–15.5)
WBC: 5.7 10*3/uL (ref 4.0–10.5)
nRBC: 0 % (ref 0.0–0.2)

## 2020-11-10 LAB — SODIUM, URINE, RANDOM: Sodium, Ur: 12 mmol/L

## 2020-11-10 LAB — TSH: TSH: 2.774 u[IU]/mL (ref 0.350–4.500)

## 2020-11-10 LAB — PROTIME-INR
INR: 1.3 — ABNORMAL HIGH (ref 0.8–1.2)
Prothrombin Time: 15.8 seconds — ABNORMAL HIGH (ref 11.4–15.2)

## 2020-11-10 LAB — CORTISOL: Cortisol, Plasma: 28.5 ug/dL

## 2020-11-10 LAB — OSMOLALITY: Osmolality: 279 mOsm/kg (ref 275–295)

## 2020-11-10 LAB — GLUCOSE, CAPILLARY
Glucose-Capillary: 123 mg/dL — ABNORMAL HIGH (ref 70–99)
Glucose-Capillary: 127 mg/dL — ABNORMAL HIGH (ref 70–99)
Glucose-Capillary: 144 mg/dL — ABNORMAL HIGH (ref 70–99)
Glucose-Capillary: 147 mg/dL — ABNORMAL HIGH (ref 70–99)
Glucose-Capillary: 174 mg/dL — ABNORMAL HIGH (ref 70–99)
Glucose-Capillary: 174 mg/dL — ABNORMAL HIGH (ref 70–99)
Glucose-Capillary: 197 mg/dL — ABNORMAL HIGH (ref 70–99)
Glucose-Capillary: 67 mg/dL — ABNORMAL LOW (ref 70–99)
Glucose-Capillary: 76 mg/dL (ref 70–99)

## 2020-11-10 LAB — PROCALCITONIN: Procalcitonin: 0.48 ng/mL

## 2020-11-10 LAB — MAGNESIUM: Magnesium: 1.9 mg/dL (ref 1.7–2.4)

## 2020-11-10 LAB — LACTIC ACID, PLASMA
Lactic Acid, Venous: 1.6 mmol/L (ref 0.5–1.9)
Lactic Acid, Venous: 1.5 mmol/L (ref 0.5–1.9)

## 2020-11-10 LAB — OSMOLALITY, URINE: Osmolality, Ur: 603 mOsm/kg (ref 300–900)

## 2020-11-10 MED ORDER — DEXTROSE-NACL 5-0.9 % IV SOLN: INTRAVENOUS | Status: DC

## 2020-11-10 MED ORDER — CHLORHEXIDINE GLUCONATE CLOTH 2 % EX PADS
6.0000 | MEDICATED_PAD | Freq: Every day | CUTANEOUS | Status: DC
  Administered 2020-11-10 – 2020-11-22 (×12): 6 via TOPICAL

## 2020-11-10 MED ORDER — LACTATED RINGERS IV BOLUS (SEPSIS)
500.0000 mL | Freq: Once | INTRAVENOUS | Status: AC
  Administered 2020-11-10: 03:00:00 500 mL via INTRAVENOUS

## 2020-11-10 MED ORDER — SODIUM CHLORIDE 0.9 % IV SOLN
2.0000 g | INTRAVENOUS | Status: DC
  Administered 2020-11-10 – 2020-11-14 (×5): 2 g via INTRAVENOUS
  Filled 2020-11-10: qty 2
  Filled 2020-11-10: qty 20
  Filled 2020-11-10 (×3): qty 2

## 2020-11-10 MED ORDER — LACTATED RINGERS IV BOLUS (SEPSIS)
1000.0000 mL | Freq: Once | INTRAVENOUS | Status: AC
  Administered 2020-11-10: 02:00:00 1000 mL via INTRAVENOUS

## 2020-11-10 MED ORDER — METRONIDAZOLE 500 MG PO TABS
500.0000 mg | ORAL_TABLET | Freq: Three times a day (TID) | ORAL | Status: DC
  Administered 2020-11-10 – 2020-11-14 (×13): 500 mg via ORAL
  Filled 2020-11-10 (×13): qty 1

## 2020-11-10 MED ORDER — OCTREOTIDE ACETATE 100 MCG/ML IJ SOLN
100.0000 ug | Freq: Two times a day (BID) | INTRAMUSCULAR | Status: DC
  Administered 2020-11-10: 13:00:00 100 ug via SUBCUTANEOUS
  Filled 2020-11-10 (×2): qty 1

## 2020-11-10 MED ORDER — SODIUM CHLORIDE 0.9 % IV BOLUS
500.0000 mL | Freq: Once | INTRAVENOUS | Status: AC
  Administered 2020-11-10: 17:00:00 500 mL via INTRAVENOUS

## 2020-11-10 MED ORDER — LACTATED RINGERS IV BOLUS (SEPSIS): 1000.0000 mL | Freq: Once | INTRAVENOUS | Status: DC

## 2020-11-10 MED ORDER — ENOXAPARIN SODIUM 30 MG/0.3ML ~~LOC~~ SOLN
30.0000 mg | Freq: Every day | SUBCUTANEOUS | Status: DC
  Administered 2020-11-10 – 2020-11-11 (×2): 30 mg via SUBCUTANEOUS
  Filled 2020-11-10 (×2): qty 0.3

## 2020-11-10 NOTE — Progress Notes (Signed)
  Speech Language Pathology Treatment: Dysphagia  Patient Details Name: Alexis Rodriguez MRN: [id number] DOB: 09-Dec-1940 Today's Date: 11/10/2020 Time: 3094-0768 SLP Time Calculation (min) (ACUTE ONLY): 15 min  Assessment / Plan / Recommendation Clinical Impression  Pt reassessed with thin liquids today in hopes of diet upgrade. Pt was alert but only communicative if ST asked simple questions. Pt initially refused stating she was not hungry but did eventually take 2 tsp of thin water with encouragement. Pt tolerated without s/s of aspiration but refused further trials. Also noted temp spike last PM. Will rec continue with current Dys 1 diet with nectar thick liquids. ST to follow up again and administer thin byb cup or straw as Pt is willing.   HPI HPI: Pt is a 79 y.o. female with medical history significant of hypertension, hyperlipidemia, dementia, iron deficiency anemia, CKD-3, who presents with strokelike symptoms. Per her daughter, patient has strokelike symptoms for about 1 month. Symptoms have been going on intermittently, including confusion, blurry vision, slurred speech, slow response with staring off. No episode of fully loss of consciousness. No facial droop. Patient moves all extremities. Patient does not have chest pain, cough, shortness breath, fever or chills. No nausea, vomiting, diarrhea. MRI: "1. No acute intracranial abnormality. 2. Moderately advanced temporal lobe predominant cerebral atrophy  with chronic small vessel ischemic disease. MR of Abd.: included small to moderate-sized type 1 hiatal hernia.      SLP Plan  Continue with current plan of care   Reassess with thin liquids    Recommendations  Diet recommendations: Dysphagia 1 (puree);Nectar-thick liquid Medication Administration: Whole meds with puree Supervision: Staff to assist with self feeding Compensations: Minimize environmental distractions Postural Changes and/or Swallow Maneuvers: Seated upright 90  degrees;Upright 30-60 min after meal                Oral Care Recommendations: Oral care BID;Oral care before and after PO;Staff/trained caregiver to provide oral care Follow up Recommendations: Skilled Nursing facility SLP Visit Diagnosis: Dysphagia, oropharyngeal phase (R13.12) Plan: Continue with current plan of care       GO                Alexis Rodriguez 11/10/2020, 12:34 PM

## 2020-11-10 NOTE — Consult Note (Addendum)
Cardiology Consultation:   Patient ID: Alexis Rodriguez; [id number]; 09-19-1941   Admit date: 11/04/2020 Date of Consult: 11/10/2020  Primary Care Provider: Perrin Maltese, MD Primary Cardiologist: New to [facility] - consult by End Primary Electrophysiologist:  None   Patient Profile:   Alexis Rodriguez is a 79 y.o. female with a hx of dementia, CKD stage III, HTN, HLD, iron deficiency anemia, and obesity who is being seen today for the evaluation of elevated troponin and bradycardia at the request of Dr. Reesa Chew.  History of Present Illness:   Ms. Zavaleta has no previously known cardiac history.  She was admitted to [facility] on 12/21 with an approximate 1 month history of intermittent strokelike symptoms including confusion, blurry vision, slurred speech, slow response, and episodes of staring off.  Notes indicate no syncope/LOC, facial droop, or extremity weakness.  She has denied any chest pain, dyspnea, or palpitations.  In the ED she was found to be hypoglycemic with a blood sugar of 39, not on any hyperglycemic medications at baseline.  CT head showed no evidence of acute intracranial abnormality with chronic microvascular ischemic disease and generalized cerebral volume loss noted.  MRI of the brain showed no acute intracranial abnormality.  Carotid artery ultrasound showed minimal bilateral atherosclerotic plaque.  With her intermittent hypoglycemia she underwent MRI of the abdomen which was largely unrevealing for acute process as noted below.  Initial troponin on 12/21 noted to be 20 with a delta of 21.  Echo on 12/22 showed an EF of 60 to 65%, no regional wall motion abnormalities, grade 1 diastolic dysfunction, normal RV systolic function and ventricular cavity size, and no significant valvular abnormalities.  Admission EKG not available for review.  Overnight heading into 12/27 the patient became febrile with a T-max of 102.2.  Of note, Covid swab at time of presentation 12/21 was negative.  Throughout her  admission initially her heart rates were predominantly in the 50s to 60s bpm range.  However, on 12/27 she has been predominantly bradycardic in the 40s bpm with stable BP in the 1 teens to 732K systolic.  She has had brief episodes of Wenckebach and 2-1 AV block without associated presyncope or syncope.  Repeat high-sensitivity troponin obtained earlier today noted to be 120 with a delta of 174.  Cardiology asked to further evaluate.  No family present at bedside.  Cardiology discussed with patient's daughter over the phone who indicated the patient had been living independently performing most of her ADLs as recently as 4 to 5 weeks ago.  Approximately 5 weeks ago she was found to be laying in bed staring off and was unresponsive for 1 to 2 minutes.  Upon coming to she was speaking in a different voice and providing a brief one-word answers.  Vital signs were found to be stable at that time.  Since this episode she has continued to have intermittent similar episodes though not as severe.  Home vital signs indicate the patient's heart rate typically runs in the upper 50s to 60s bpm.  Historically, she has been on 2 different blood pressure medicines with readings in the 140s though more recently BP has been soft in the 1 teens systolic.  Patient has never complained of any anginal symptoms to family.    Past Medical History:  Diagnosis Date  . Dementia ([place])   . Hypertension   . Pancreatitis     Past Surgical History:  Procedure Laterality Date  . CHOLECYSTECTOMY  Home Meds: Prior to Admission medications   Medication Sig Start Date End Date Taking? Authorizing Provider  aspirin 81 MG EC tablet Take by mouth.   Yes [provider]  fenofibrate (TRICOR) 145 MG tablet Take 145 mg by mouth daily. 04/17/16  Yes [provider]  ferrous sulfate 325 (65 FE) MG tablet Take 325 mg by mouth daily. 10/21/20  Yes [provider]  Magnesium 250 MG TABS Take 250 mg by mouth  daily.   Yes [provider]  melatonin 5 MG TABS Take 5 mg by mouth at bedtime.   Yes [provider]  NAMZARIC 28-10 MG CP24 Take 1 capsule by mouth daily. 07/09/20  Yes [provider]  OLANZapine (ZYPREXA) 5 MG tablet Take 5 mg by mouth at bedtime. 10/21/20  Yes [provider]  sertraline (ZOLOFT) 25 MG tablet Take 25 mg by mouth daily. 08/14/20  Yes [provider]  amLODipine (NORVASC) 5 MG tablet Take by mouth. Patient not taking: Reported on 11/04/2020 10/20/20   [provider]  ciprofloxacin (CIPRO) 500 MG tablet Take 500 mg by mouth 2 (two) times daily. Patient not taking: Reported on 11/04/2020 10/23/20   [provider]  hydrochlorothiazide (HYDRODIURIL) 25 MG tablet TAKE 1 TABLET BY MOUTH EVERY DAY IN THE MORNING Patient not taking: Reported on 11/04/2020 10/20/20   [provider]    Inpatient Medications: Scheduled Meds: . aspirin EC  81 mg Oral Daily  . Chlorhexidine Gluconate Cloth  6 each Topical Daily  . enoxaparin (LOVENOX) injection  30 mg Subcutaneous Q2200  . ferrous sulfate  325 mg Oral Daily  . magnesium gluconate  250 mg Oral Daily  . melatonin  5 mg Oral QHS  . memantine  28 mg Oral QHS  . metroNIDAZOLE  500 mg Oral Q8H  . octreotide  100 mcg Subcutaneous Q12H  . OLANZapine  5 mg Oral QHS  . rosuvastatin  10 mg Oral Daily   Continuous Infusions: . cefTRIAXone (ROCEPHIN)  IV 2 g (11/10/20 0907)  . lactated ringers     PRN Meds: acetaminophen **OR** acetaminophen (TYLENOL) oral liquid 160 mg/5 mL **OR** acetaminophen, dextrose, hydrALAZINE, ondansetron (ZOFRAN) IV  Allergies:   Allergies  Allergen Reactions  . Shellfish Allergy Anaphylaxis  . Penicillins     Other reaction(s): Unknown    Social History:   Social History   Socioeconomic History  . Marital status: Widowed    Spouse name: Not on file  . Number of children: Not on file  . Years of education: Not on file  .  Highest education level: Not on file  Occupational History  . Not on file  Tobacco Use  . Smoking status: Never Smoker  . Smokeless tobacco: Never Used  Substance and Sexual Activity  . Alcohol use: Never  . Drug use: Never  . Sexual activity: Not on file  Other Topics Concern  . Not on file  Social History Narrative  . Not on file   Social Determinants of Health   Financial Resource Strain: Not on file  Food Insecurity: Not on file  Transportation Needs: Not on file  Physical Activity: Not on file  Stress: Not on file  Social Connections: Not on file  Intimate Partner Violence: Not on file     Family History:   Family History  Problem Relation Age of Onset  . Diabetes Mellitus II Mother   . Atrial fibrillation Sister     ROS:  Review of Systems  Unable to perform ROS: Dementia      Physical Exam/Data:   Vitals:   11/10/20 0530 11/10/20 0804 11/10/20 0906 11/10/20 1220  BP: (!) 123/41 (!) 158/142 (!) 147/45 (!) 113/35  Pulse: (!) 43 (!) 47 (!) 49 (!) 43  Resp: 18 16 20 20   Temp: 99 F (37.2 C) 98.5 F (36.9 C) 99.3 F (37.4 C) 99.2 F (37.3 C)  TempSrc:      SpO2: 98% 93% 98% 100%  Weight:      Height:        Intake/Output Summary (Last 24 hours) at 11/10/2020 1506 Last data filed at 11/10/2020 1432 Gross per 24 hour  Intake 120 ml  Output 1600 ml  Net -1480 ml   Filed Weights   11/04/20 1340  Weight: 78.7 kg   Body mass index is 30.75 kg/m.   Physical Exam: General: Well developed, well nourished, in no acute distress. Head: Normocephalic, atraumatic, sclera non-icteric, no xanthomas, nares without discharge.  Neck: Negative for carotid bruits. JVD not elevated. Lungs: Clear bilaterally to auscultation without wheezes, rales, or rhonchi. Breathing is unlabored. Heart: Bradycardic with S1 S2. No murmurs, rubs, or gallops appreciated. Abdomen: Soft, mild diffuse tenderness to palpation, non-distended with normoactive bowel sounds. No  hepatomegaly. No rebound/guarding. No obvious abdominal masses. Msk:  Strength and tone appear normal for age. Extremities: No clubbing or cyanosis. No edema. Distal pedal pulses are 2+ and equal bilaterally. Neuro: Alert and oriented X 1 (person). No facial asymmetry. No focal deficit. Moves all extremities spontaneously. Psych:  Responds to questions with a normal affect.   EKG:  The EKG was personally reviewed and demonstrates: Not in Epic for review Telemetry:  Telemetry was personally reviewed and demonstrates: Currently in sinus bradycardia with heart rates in the 40s bpm.  Brief episodes of 2-1 AV block and Wenckebach  Weights: Filed Weights   11/04/20 1340  Weight: 78.7 kg    Relevant CV Studies:  2D echo 11/05/2020: 1. Left ventricular ejection fraction, by estimation, is 60 to 65%. The  left ventricle has normal function. The left ventricle has no regional  wall motion abnormalities. Left ventricular diastolic parameters are  consistent with Grade I diastolic  dysfunction (impaired relaxation).  2. Right ventricular systolic function is normal. The right ventricular  size is normal.  3. The mitral valve is normal in structure. No evidence of mitral valve  regurgitation.  4. The aortic valve is tricuspid. Aortic valve regurgitation is not  visualized. Mild aortic valve sclerosis is present, with no evidence of  aortic valve stenosis.  5. Echogenic structure noted in the posterior aorta (clips 2,3,7) this  could be artifactual vs supravalvular membrane.  Laboratory Data:  Chemistry Recent Labs  Lab 11/10/20 0217 11/10/20 0500 11/10/20 1228  NA 129* 126* 129*  K 4.4 3.9 4.4  CL 93* 94* 96*  CO2 24 23 23   GLUCOSE 108* 85 160*  BUN 52* 50* 48*  CREATININE 2.59* 2.19* 1.96*  CALCIUM 8.9 8.2* 8.3*  GFRNONAA 18* 22* 25*  ANIONGAP 12 9 10     Recent Labs  Lab 11/04/20 1345 11/10/20 0217  PROT 7.8 6.9  ALBUMIN 3.8 3.4*  AST 47* 161*  ALT 25 54*  ALKPHOS  35* 30*  BILITOT 0.5 0.6   Hematology Recent Labs  Lab 11/04/20 1345 11/07/20 0608 11/10/20 0217  WBC 9.5 8.9 5.7  RBC 4.50 4.33 4.36  HGB 13.7 13.2 13.0  HCT 41.5 38.8 39.2  MCV 92.2 89.6 89.9  MCH 30.4 30.5 29.8  MCHC 33.0 34.0 33.2  RDW 13.2 12.4 13.2  PLT 306 271 241   Cardiac EnzymesNo results for input(s): TROPONINI in the last 168 hours. No results for input(s): TROPIPOC in the last 168 hours.  BNPNo results for input(s): BNP, PROBNP in the last 168 hours.  DDimer No results for input(s): DDIMER in the last 168 hours.  Radiology/Studies:  EEG  Result Date: 11/06/2020 IMPRESSION: This is a technically difficult awake electroencephalogram secondary to artifact.  No epileptiform activity is noted.  Alexis Goodell, MD Neurology 11/06/2020, 7:47 PM   DG Chest 1 View  Result Date: 11/10/2020 IMPRESSION: 1. Improving bibasilar airspace disease. 2. No new airspace disease. 3. Low lung volumes 4. Aortic atherosclerosis. Electronically Signed   By: San Morelle M.D.   On: 11/10/2020 01:37   DG Chest 2 View  Result Date: 11/09/2020 IMPRESSION: Mild bibasilar atelectasis. Electronically Signed   By: Inez Catalina M.D.   On: 11/09/2020 13:11   US RENAL  Result Date: 11/10/2020 IMPRESSION: Unremarkable renal ultrasound. Electronically Signed   By: Primitivo Gauze M.D.   On: 11/10/2020 12:04    Assessment and Plan:   1.  Elevated troponin: -Of uncertain significance -Patient denies any anginal symptoms -Echo this admission with preserved LV systolic function and no regional wall motion abnormality -Troponin is minimally elevated and flat trending thus far not consistent with ACS -No indication for heparin drip or emergent cardiac cath -Cannot exclude some degree of demand ischemia in the setting of febrile illness with acute kidney injury, and significant hypoglycemia -Recommend obtaining EKG and trending troponin until peak -We will attempt to contact the  patient's daughter for further details and to assess scope of care desired as indicated -ASA -Crestor  2.  Sinus bradycardia/2-1 AV block/Wenckebach: -Cannot exclude some degree of vagal episodes with potential obstructive uropathy, continued abdominal pain, and fever of uncertain etiology -TSH normal this admission, potassium at goal -Check magnesium -Currently hemodynamically stable with BP in the 1 teens to 616W systolic -No indication for atropine at this time -If she becomes unstable could transfer to the ICU and start dopamine infusion -Avoid AV nodal blocking medications -No indication for pacemaker at this time -When able from a medical perspective, ambulate to assess for appropriate chronotropic competence  3.  Fever: -Consider reswabbing for Covid, management per IM  4.  AKI on CKD stage III: -Possibly in the setting of obstructive uropathy -Improving -Avoid nephrotoxic agents  5.  Lethargy/intermittent hypoglycemia/confusion: -Possibly multifactorial including episodes of hypoglycemia and hyponatremia -Given symptoms were present when patient was noted to have heart rates in the 50s to 60s bpm at baseline less likely primary cardiac in etiology -Management per internal medicine   For questions or updates, please contact [place] Please consult [email or web address] for contact info under Cardiology/STEMI.   Signed, Christell Faith, PA-C [place] Pager: [phone] 11/10/2020, 3:06 PM

## 2020-11-10 NOTE — Progress Notes (Signed)
Patients glucose is 67. She has a poor appetite and is refusing to eat/drink. MD was notified and ordered to give D50.

## 2020-11-10 NOTE — Progress Notes (Addendum)
    BRIEF OVERNIGHT PROGRESS REPORT   SUBJECTIVE: Patient spiked fever overnight 100.3>100.9>102.2>101.6. Also noted to be tachychardic rate upto 116bmp and tachypneic 28. Appears that she has been brady most of the day. Now sustaining brady in the low 40s. Per RN, She is more lethargic and altered.  OBJECTIVE: On bedside assessment, she was febrile temp 102.2 with blood pressure 138/33 mm Hg and pulse rate 83 beats/min. There were no focal neurological deficits; she was lethargic and unable to follow commands.   ASSESSMENT & PLAN: Alexis Rodriguez a 79 y.o.femalewith medical history significant ofhypertension, hyperlipidemia, dementia, iron deficiency anemia, CKD-3, who presents with strokelike symptoms now course complicated by development of fever.  Fever  Spiked a fever 100.3>100.9>102.2>101.6. associated with altered mental status, tachycardia and tachypnea. Unclear source. Further work up as below - CBC, CMP, Procalcitonin and Lactic  - Blood Cultures - UA + Urine Culture - CXR - Hepatitis panel - Will hold starting Empiric ABx pending work up as above     Rufina Falco, BSN, MSN, [employer], [employer]  [facility] Nurse Practitioner  [facility]

## 2020-11-10 NOTE — Progress Notes (Signed)
PHARMACIST - PHYSICIAN COMMUNICATION  CONCERNING:  Enoxaparin (Lovenox) for DVT Prophylaxis    RECOMMENDATION: Patient was prescribed enoxaprin 40mg  q24 hours for VTE prophylaxis.   Filed Weights   11/04/20 1340  Weight: 78.7 kg (173 lb 9.6 oz)    Body mass index is 30.75 kg/m.  Estimated Creatinine Clearance: 17.2 mL/min (A) (by C-G formula based on SCr of 2.59 mg/dL (H)).   Patient is candidate for enoxaparin 30mg  every 24 hours based on CrCl <91ml/min or Weight <45kg  DESCRIPTION: Pharmacy has adjusted enoxaparin dose per [facility] policy.  Patient is now receiving enoxaparin 30 mg every 24 hours    Dorothe Pea, PharmD, BCPS Clinical Pharmacist  11/10/2020 8:08 AM

## 2020-11-10 NOTE — Care Management Important Message (Signed)
Important Message  Patient Details  Name: Alexis Rodriguez MRN: [id number] Date of Birth: 04-Jul-1941   Medicare Important Message Given:  Yes     Dannette Barbara 11/10/2020, 10:59 AM

## 2020-11-10 NOTE — Progress Notes (Signed)
PROGRESS NOTE    Alexis Rodriguez  RFF:[id number] DOB: [birth year] DOA: 11/04/2020 PCP: Perrin Maltese, MD   Brief Narrative: Taken from H&P. Alexis Rodriguez is a 79 y.o. female with medical history significant of hypertension, hyperlipidemia, dementia, iron deficiency anemia, CKD-3, who presents with strokelike symptoms.  Per her daughter, patient has strokelike symptoms for about 1 month. Symptoms have been going on intermittently, including confusion, blurry vision, slurred speech, slow response with staring off. No episode of fully loss of consciousness. No facial droop. Patient moves all extremities. Patient does not have chest pain, cough, shortness breath, fever or chills. No nausea, vomiting, diarrhea, abdominal pain, symptoms of UTI.   She was also found to have hypoglycemia without any prior diagnosis of diabetes or being on any antidiabetic medications. CT head and MRI without any acute infarct.  Continue to drop blood glucose level.  Requiring D10. Neurology was consulted-recommending stroke work-up and EEG, which was negative. Persistent hyperinsulinism hypoglycemia, rest of the labs pending. Responded well to glucagon and octreotide in the beginning, later maintaining blood glucose on diazoxide.  Subjective: Patient developed fever and worsening lethargy overnight, labs and imaging done by nighttime provider.  Appears more lethargic when seen today, she was shaking. When I ask she was unable to tell me that whether she is having any pain. She was alert to name only. Retaining urine.  Assessment & Plan:   Principal Problem:   Stroke-like symptoms Active Problems:   HTN (hypertension)   Hypoglycemia   Dementia (HCC)   HLD (hyperlipidemia)   Iron deficiency anemia   CKD (chronic kidney disease), stage IIIa   Depression   Pressure injury of skin  Stroke right symptoms/persistent intermittent hypoglycemia.  Imaging is negative for any acute infarct.  Echocardiogram with normal EF  and grade 1 diastolic dysfunction.  A1c of 4.7.  EEG without any epileptiform changes. Very vague symptoms might be due to intermittent hypoglycemia. Intermittent symptoms for 1 month, where she intermittently seems more confused, staring and sometimes sweating.  Per daughter she is having good p.o. intake and ready consistent with her routine. Patient has underlying dementia and is oriented to self only at baseline. Patient responded well to glucagon challenge and later blood sugar increased above 200 with octreotide subcu. had curbside discussion with endocrinology and our pharmacist and decided to start her on diazoxide.  Able to maintain her blood glucose level so far.  CBG remained between 110-89 over the past 24-hour. Insulin and C-peptide elevated. Sulfonylurea , pro insuline, insulin antibodies levels still pending. A.m. cortisol mildly elevated. Pancreatic protocol MRI without any significant abnormality. 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 -Discontinue diazoxide and start her on subcu octreotide as it can cause urinary retention. -Continue CBG monitoring. -Check TSH -Talked with Dr. Gabriel Carina from endocrinology, advised to continue with octreotide and follow-up as an outpatient.  Fever. Patient become febrile with fever up to 102.2 and shakiness overnight. CBC with no leukocytosis, no lactic acidosis, procalcitonin at 0.48, blood cultures remain negative after 12-hour. UA does not look infected but cultures are pending. CMP with AKI and transaminitis along with hyponatremia. Received 2.5 L of LR as boluses overnight. -Add Rocephin and Flagyl-allergic to amoxicillin. -Follow-up blood and urine cultures.  Hyponatremia.  New onset hyponatremia.  Worsening sodium at 126 this morning.  Patient did receive quite a bit of fluid overnight for new onset of fever. -Checking hypoglycemia labs. -Discontinue Zoloft. -Moniter closely.  AKI and urinary retention.   Patient developed urinary  retention and AKI. Overnight in and out with more than 600 cc.  No urinary output this morning so bladder scan done and it shows more than 1 L. -Put Foley catheter. -Renal ultrasound. -Monitor kidney functions.  Cough.  Per nursing concern that she developed new cough over the past 24-hour.  Remained afebrile.  Concern of aspiration. -Chest x-ray-bilateral atelectasis, no obvious infiltrate. -Speech and swallow evaluation-concern of aspiration with thin liquids, diet changed to nectar thick.  Physical deconditioning.  PT recommending SNF.  Family is agreeable. -TOC started working on it.  Pyuria.  No urinary symptoms.  Urine cultures are pending.Per daughter received recent 3 day course of cipro.by PCP for concern of UTI. - urine cultures-multiple species.  Hypertension.  She was not on any antihypertensives at home.  Blood pressure within goal today. -Continue to monitor. -Hydralazine as needed for systolic above 557.  Depression.  No acute concern. -Continue home dose of  Olanzapine. -Discontinue Zoloft as she developed hyponatremia.  Dementia ([place]) -Continue Namzaric  HLD (hyperlipidemia) -Continue Tricor  Iron deficiency anemia: Hemoglobin 13.7 -Continue iron supplement  CKD (chronic kidney disease), stage IIIa: Stable -Follow-up with BMP  Objective: Vitals:   11/10/20 0320 11/10/20 0530 11/10/20 0804 11/10/20 0906  BP: (!) 118/35 (!) 123/41 (!) 158/142 (!) 147/45  Pulse: (!) 45 (!) 43 (!) 47 (!) 49  Resp: 17 18 16 20   Temp: 99.6 F (37.6 C) 99 F (37.2 C) 98.5 F (36.9 C) 99.3 F (37.4 C)  TempSrc:      SpO2: 92% 98% 93% 98%  Weight:      Height:        Intake/Output Summary (Last 24 hours) at 11/10/2020 1053 Last data filed at 11/10/2020 3220 Gross per 24 hour  Intake 0 ml  Output 1600 ml  Net -1600 ml   Filed Weights   11/04/20 1340  Weight: 78.7 kg    Examination:  General.  Lethargic elderly lady, in no acute  distress. Pulmonary.  Lungs clear bilaterally, normal respiratory effort. CV.  Regular rate and rhythm, no JVD, rub or murmur. Abdomen.  Soft, nontender, nondistended, BS positive. CNS.  Alert and oriented to self only.  No focal neurologic deficit. Extremities.  No edema, no cyanosis, pulses intact and symmetrical. Psychiatry.  Judgment and insight appears normal.  DVT prophylaxis: Lovenox Code Status: Full Family Communication: Discussed with daughter at bedside. Disposition Plan:  Status is: Inpatient  Remains inpatient appropriate because:Inpatient level of care appropriate due to severity of illness   Dispo: The patient is from: Home              Anticipated d/c is to: SNF              Anticipated d/c date is: 1-2 days.              Patient currently is not medically stable.  TOC is looking for placement. Patient developed new onset fever and urinary retention.  Consultants:   Neurology  Curbside endocrinology  Procedures:  Antimicrobials:  Rocephin Flagyl  Data Reviewed: I have personally reviewed following labs and imaging studies  CBC: Recent Labs  Lab 11/04/20 1345 11/07/20 0608 11/10/20 0217  WBC 9.5 8.9 5.7  NEUTROABS 6.3  --  3.7  HGB 13.7 13.2 13.0  HCT 41.5 38.8 39.2  MCV 92.2 89.6 89.9  PLT 306 271 254   Basic Metabolic Panel: Recent Labs  Lab 11/04/20 1345 11/07/20 0608 11/10/20 0217 11/10/20 0500  NA 137 128* 129*  126*  K 4.1 4.0 4.4 3.9  CL 100 92* 93* 94*  CO2 28 26 24 23   GLUCOSE 39* 201* 108* 85  BUN 14 13 52* 50*  CREATININE 1.20* 1.07* 2.59* 2.19*  CALCIUM 9.2 8.9 8.9 8.2*   GFR: Estimated Creatinine Clearance: 20.3 mL/min (A) (by C-G formula based on SCr of 2.19 mg/dL (H)). Liver Function Tests: Recent Labs  Lab 11/04/20 1345 11/10/20 0217  AST 47* 161*  ALT 25 54*  ALKPHOS 35* 30*  BILITOT 0.5 0.6  PROT 7.8 6.9  ALBUMIN 3.8 3.4*   No results for input(s): LIPASE, AMYLASE in the last 168 hours. No results for  input(s): AMMONIA in the last 168 hours. Coagulation Profile: Recent Labs  Lab 11/04/20 1345 11/10/20 0217  INR 1.1 1.3*   Cardiac Enzymes: No results for input(s): CKTOTAL, CKMB, CKMBINDEX, TROPONINI in the last 168 hours. BNP (last 3 results) No results for input(s): PROBNP in the last 8760 hours. HbA1C: No results for input(s): HGBA1C in the last 72 hours. CBG: Recent Labs  Lab 11/10/20 0053 11/10/20 0525 11/10/20 0744 11/10/20 0835 11/10/20 1035  GLUCAP 123* 76 67* 147* 127*   Lipid Profile: No results for input(s): CHOL, HDL, LDLCALC, TRIG, CHOLHDL, LDLDIRECT in the last 72 hours. Thyroid Function Tests: No results for input(s): TSH, T4TOTAL, FREET4, T3FREE, THYROIDAB in the last 72 hours. Anemia Panel: No results for input(s): VITAMINB12, FOLATE, FERRITIN, TIBC, IRON, RETICCTPCT in the last 72 hours. Sepsis Labs: Recent Labs  Lab 11/10/20 0217 11/10/20 0500  PROCALCITON 0.48  --   LATICACIDVEN 1.6 1.5    Recent Results (from the past 240 hour(s))  Resp Panel by RT-PCR (Flu A&B, Covid) Nasopharyngeal Swab     Status: None   Collection Time: 11/04/20  3:14 PM   Specimen: Nasopharyngeal Swab; Nasopharyngeal(NP) swabs in vial transport medium  Result Value Ref Range Status   SARS Coronavirus 2 by RT PCR NEGATIVE NEGATIVE Final    Comment: (NOTE) SARS-CoV-2 target nucleic acids are NOT DETECTED.  The SARS-CoV-2 RNA is generally detectable in upper respiratory specimens during the acute phase of infection. The lowest concentration of SARS-CoV-2 viral copies this assay can detect is 138 copies/mL. A negative result does not preclude SARS-Cov-2 infection and should not be used as the sole basis for treatment or other patient management decisions. A negative result may occur with  improper specimen collection/handling, submission of specimen other than nasopharyngeal swab, presence of viral mutation(s) within the areas targeted by this assay, and inadequate number  of viral copies(<138 copies/mL). A negative result must be combined with clinical observations, patient history, and epidemiological information. The expected result is Negative.  Fact Sheet for Patients:  EntrepreneurPulse.com.au  Fact Sheet for Healthcare Providers:  IncredibleEmployment.be  This test is no t yet approved or cleared by the [place] FDA and  has been authorized for detection and/or diagnosis of SARS-CoV-2 by FDA under an Emergency Use Authorization (EUA). This EUA will remain  in effect (meaning this test can be used) for the duration of the COVID-19 declaration under Section 564(b)(1) of the Act, 21 U.S.C.section 360bbb-3(b)(1), unless the authorization is terminated  or revoked sooner.       Influenza A by PCR NEGATIVE NEGATIVE Final   Influenza B by PCR NEGATIVE NEGATIVE Final    Comment: (NOTE) The Xpert Xpress SARS-CoV-2/FLU/RSV plus assay is intended as an aid in the diagnosis of influenza from Nasopharyngeal swab specimens and should not be used as a sole basis for  treatment. Nasal washings and aspirates are unacceptable for Xpert Xpress SARS-CoV-2/FLU/RSV testing.  Fact Sheet for Patients: EntrepreneurPulse.com.au  Fact Sheet for Healthcare Providers: IncredibleEmployment.be  This test is not yet approved or cleared by the [place] FDA and has been authorized for detection and/or diagnosis of SARS-CoV-2 by FDA under an Emergency Use Authorization (EUA). This EUA will remain in effect (meaning this test can be used) for the duration of the COVID-19 declaration under Section 564(b)(1) of the Act, 21 U.S.C. section 360bbb-3(b)(1), unless the authorization is terminated or revoked.  Performed at [facility], [address], [place], [place] [address]   Urine Culture     Status: Abnormal   Collection Time: 11/04/20  3:42 PM   Specimen: Urine, Random  Result  Value Ref Range Status   Specimen Description   Final    URINE, RANDOM Performed at [facility] [address], [place], [place] [address]    Special Requests   Final    NONE Performed at [facility], [address] [place], [place] [address]    Culture MULTIPLE SPECIES PRESENT, SUGGEST RECOLLECTION (A)  Final   Report Status 11/06/2020 FINAL  Final  Culture, blood (x 2)     Status: None (Preliminary result)   Collection Time: 11/10/20  2:17 AM   Specimen: BLOOD  Result Value Ref Range Status   Specimen Description BLOOD BLOOD RIGHT HAND  Final   Special Requests   Final    BOTTLES DRAWN AEROBIC AND ANAEROBIC Blood Culture results may not be optimal due to an inadequate volume of blood received in culture bottles   Culture   Final    NO GROWTH < 12 HOURS Performed at [facility], [address], [place], [place] [address]    Report Status PENDING  Incomplete  Culture, blood (x 2)     Status: None (Preliminary result)   Collection Time: 11/10/20  2:18 AM   Specimen: BLOOD  Result Value Ref Range Status   Specimen Description BLOOD BLOOD LEFT HAND  Final   Special Requests   Final    BOTTLES DRAWN AEROBIC AND ANAEROBIC Blood Culture adequate volume   Culture   Final    NO GROWTH < 12 HOURS Performed at [facility], [address] [place], [place] [address]    Report Status PENDING  Incomplete     Radiology Studies: DG Chest 1 View  Result Date: 11/10/2020 CLINICAL DATA:  Sepsis. EXAM: CHEST  1 VIEW COMPARISON:  Two-view chest x-ray 11/09/2020 FINDINGS: Heart size is normal. Atherosclerotic changes are noted at the aortic arch. Lower lobe airspace opacity is improving. No new airspace disease present. Lung volumes are low. Axial skeleton is within normal limits. Surgical clips are present at the gallbladder fossa. IMPRESSION: 1. Improving bibasilar airspace disease. 2. No new airspace disease. 3. Low lung volumes 4. Aortic  atherosclerosis. Electronically Signed   By: San Morelle M.D.   On: 11/10/2020 01:37   DG Chest 2 View  Result Date: 11/09/2020 CLINICAL DATA:  Cough EXAM: CHEST - 2 VIEW COMPARISON:  02/01/2013 FINDINGS: Cardiac shadow is within normal limits. Aortic calcifications are seen. The lungs are well aerated bilaterally. Minimal bibasilar atelectasis is seen left greater than right. No sizable effusion is noted. No bony abnormality is seen. IMPRESSION: Mild bibasilar atelectasis. Electronically Signed   By: Inez Catalina M.D.   On: 11/09/2020 13:11    Scheduled Meds: . aspirin EC  81 mg Oral Daily  .  Chlorhexidine Gluconate Cloth  6 each Topical Daily  . diazoxide  3 mg/kg/day Oral Q8H  . enoxaparin (LOVENOX) injection  30 mg Subcutaneous Q2200  . ferrous sulfate  325 mg Oral Daily  . magnesium gluconate  250 mg Oral Daily  . melatonin  5 mg Oral QHS  . memantine  28 mg Oral QHS  . metroNIDAZOLE  500 mg Oral Q8H  . OLANZapine  5 mg Oral QHS  . rosuvastatin  10 mg Oral Daily  . sertraline  25 mg Oral Daily   Continuous Infusions: . cefTRIAXone (ROCEPHIN)  IV 2 g (11/10/20 0907)  . lactated ringers       LOS: 5 days   Time spent: 50 minutes.  Lorella Nimrod, MD [facility]  If 7PM-7AM, please contact night-coverage [email or web address]  11/10/2020, 10:53 AM   This record has been created using Systems analyst. Errors have been sought and corrected,but may not always be located. Such creation errors do not reflect on the standard of care.

## 2020-11-10 NOTE — TOC Progression Note (Signed)
Transition of Care [facility]) - Progression Note    Patient Details  Name: Alexis Rodriguez MRN: [id number] Date of Birth: [birth year]  Transition of [facility]) CM/SW Contact  Shelbie Hutching, RN Phone Number: 11/10/2020, 3:32 PM  Clinical Narrative:    Patient was unable to work with PT today due to lethargy.  Per nursing patient is not eating or drinking much.  Plan is for SNF daughter prefers [facility].  RNCM reached out to [facility] and they will review to see if they can accept.  Patient is not yet medically stable for discharge.    Expected Discharge Plan: [place] Barriers to Discharge: Continued Medical Work up  Expected Discharge Plan and Services Expected Discharge Plan: [place] Choice: [place] arrangements for the past 2 months: Single Family Home                 DME Arranged: 3-N-1 DME Agency: [employer] Date DME Agency Contacted: 11/05/20   Representative spoke with at DME Agency: [place]: PT,OT           Social Determinants of Health (SDOH) Interventions    Readmission Risk Interventions No flowsheet data found.

## 2020-11-10 NOTE — Progress Notes (Signed)
   11/09/20 2039  Assess: MEWS Score  Temp 100.3 F (37.9 C)  BP (!) 138/123  Pulse Rate (!) 116  Resp 18  SpO2 97 %  O2 Device Room Air  Assess: MEWS Score  MEWS Temp 0  MEWS Systolic 0  MEWS Pulse 2  MEWS RR 0  MEWS LOC 0  MEWS Score 2  MEWS Score Color Yellow  Assess: if the MEWS score is Yellow or Red  Were vital signs taken at a resting state? Yes  Focused Assessment Change from prior assessment (see assessment flowsheet)  Early Detection of Sepsis Score *See Row Information* Low  MEWS guidelines implemented *See Row Information* Yes  Treat  MEWS Interventions Escalated (See documentation below)  Pain Scale 0-10  Patients Stated Pain Goal 0  Patients response to intervention Effective  Take Vital Signs  Increase Vital Sign Frequency  Yellow: Q 2hr X 2 then Q 4hr X 2, if remains yellow, continue Q 4hrs  Escalate  MEWS: Escalate Yellow: discuss with charge nurse/RN and consider discussing with provider and RRT  Notify: Charge Nurse/RN  Name of Charge Nurse/RN Notified Kennyth Lose  Date Charge Nurse/RN Notified 11/09/20  Time Charge Nurse/RN Notified 2030  Inserted for [employer] RN

## 2020-11-10 NOTE — Progress Notes (Signed)
PT Cancellation Note  Patient Details Name: Alaiza Yau MRN: [id number] DOB: Mar 31, 1941   Cancelled Treatment:     Treatment held this am due to pt having temp last night with increased lethargy noted. Upon visit, pt's temp at 99.2, BP 113/35, HR 43bpm.  Pt not responding to verbal or manual cues.    Josie Dixon 11/10/2020, 1:05 PM

## 2020-11-10 NOTE — Discharge Instructions (Signed)
Cognitive Rehabilitation After a Stroke After a stroke, you may have various problems with thinking (cognitive disability). The types of problems you have will depend on how severe the stroke was and where it was located in the brain. Problems may include:  Problems with short-term memory.  Trouble paying attention.  Trouble communicating or understanding language (aphasia).  A drop in mental ability that may interfere with daily life (dementia).  Trouble with problem-solving and information processing.  Problems with reading, writing, or math.  Problems with your ability to plan and to perform activities in sequence (executive function). These problems can feel overwhelming. However, with rehabilitation and time to heal, many people have improvement in their symptoms. What causes cognitive disability? A stroke happens when blood cannot flow to certain areas of the brain. When this happens, brain cells die in the affected areas because they cannot get oxygen and nutrients from the blood. Cognitive disability is caused by the death of cells in the areas of the brain that control thinking. What is cognitive rehabilitation? Cognitive rehabilitation is a program to help you improve your thinking skills after a stroke. Rehabilitation cannot completely reverse the effects of a stroke, but it can help you with memory, problem-solving, and communication skills. Therapy focuses on:  Improving brain function. This may involve activities such as learning to break down tasks into simple steps.  Helping you learn ways to cope with thinking problems. For example, you might learn memory tricks or do activities that stimulate memory, such as naming objects or describing pictures. Cognitive rehabilitation may include:  Speech-language therapy to help you understand and use language to communicate.  Occupational therapy to help you perform daily activities.  Music therapy to help relieve stress,  anxiety, and depression. This may involve listening to music, singing, or playing instruments.  Physical therapy to help improve your ability to move and perform actions that involve the muscles (motor functions). When will therapy start and where will I have therapy? Your health care provider will decide when it is best for you to start therapy. In some cases, people start rehabilitation as soon as their health is stable, which may be 24-48 hours after the stroke. Rehabilitation can take place in a few different places, based on your needs. It may take place in:  The hospital or an in-patient rehabilitation hospital.  An outpatient rehabilitation facility.  A long-term care facility.  A community rehabilitation clinic.  Your home. What are some tools to help after a stroke? There are a number of tools and apps that you can use on your smartphone, personal computer, or tablet to help improve brain function. Some of these apps include:  Calendar reminders or alarm apps to help with memory.  Note-taking or sketch pad apps to help with memory or communication.  Text-to-speech apps that allow you to listen to what you are reading, which helps your ability to understanding text.  Picture dictionary or picture message apps to help with communication.  E-readers. These can highlight text as it is read aloud, which helps with listening and reading skills. How can my friends or family help during my rehabilitation? During your recovery, it is important that your friends and family members help you work toward more independence. Your caregivers should speak with your health care providers to learn how they can best help you during recovery. This may include working on speech-language or memory exercises at home, or helping with daily tasks and errands. If you have cognitive disability, you  may be at risk for injury or accidents at home, such as forgetting to turn off the stove. Friends and family  members can help ensure home safety by taking steps such as getting appliances with automatic shut-off features or storing dangerous objects in a secure place. What else should I know about cognitive rehabilitation after a stroke? Having trouble with memory and problem-solving can make you feel alone. You may also have mood changes, anxiety, or depression after a stroke. It is important to:  Stay connected with others through social groups, online support groups, or your community.  Talk to your friends, family, and caregivers about any emotional problems you are having.  Go to one-on-one or group therapy as suggested by your health care provider.  Stay physically active and exercise as often as suggested by your health care provider. Summary  After a stroke, some people have problems with thinking that affect attention, memory, language, communication, and problem-solving.  Cognitive rehabilitation is a program to help you regain brain function and learn skills to cope with thinking problems.  Rehabilitation cannot completely reverse the effects of a stroke, but it can help to improve quality of life.  Cognitive rehabilitation may include speech-language therapy, occupational therapy, music therapy, and physical therapy. This information is not intended to replace advice given to you by your health care provider. Make sure you discuss any questions you have with your health care provider. Document Revised: 02/21/2019 Document Reviewed: 02/04/2017 Elsevier Patient Education  [place].   Delirium Tremens Delirium tremens, also called DTs, is the worst kind of alcohol withdrawal. It happens when you stop drinking or you drink less than usual. DTs is an emergency. You need to be treated in a hospital or a treatment center right away. What are the causes? DTs happens when you drink a lot and often and then stop drinking. It can also happen if you drink less than you usually  drink. What increases the risk? The more a person drinks and the longer he or she drinks, the greater the risk of DTs. This condition is more likely to develop in those who have a history of drinking heavily and:  Have had alcohol withdrawal or DTs in the past.  Have had a seizure during past alcohol withdrawal.  Are elderly.  Are pregnant.  Use drugs.  Take medicines to help you relax (sedatives).  Have medical problems, including: ? Infections. ? Heart, lung, or liver disease. ? Seizures. ? Behavioral health conditions. What are the signs or symptoms? Early symptoms of alcohol withdrawal happen before symptoms of DTs. These symptoms may:  Start within 6 hours after the most recent drink.  Last for 5-7 days. Early symptoms may include:  Problems sleeping  Problems thinking clearly.  Feeling worried or nervous (anxious).  Feeling moodier and more upset than usual.  Fast breathing.  Headache.  Feeling sick to your stomach (nauseous) and vomiting.  Fever.  Sweating.  A heartbeat that feels faster than normal.  A heartbeat that feels like it skips a beat or flutters.  Shaking (tremor) of a body part. Symptoms of DTs may:  Start to develop 3 days after the most recent drink.  Last for up to 8 days. Symptoms include:  Changes in attention and awareness.  Changes in heart rate, temperature, blood pressure, and oxygen levels. Most often, the heart rate and body temperature will rise.  Feeling or seeing things that are not really there (hallucinations).  Extreme tiredness and not being able  to wake up.  Extreme confusion.  Seizures. How is this treated? Treatment for this condition is done in a hospital or treatment center. It may include:  Monitoring your temperature, heart rate, blood pressure, and oxygen.  Fluids and minerals given through an IV.  Medicines to help you relax.  Medicines that help you worry less.  Medicines to control your  blood pressure.  Medicines to prevent or control seizures.  Vitamins.  Nutrition by mouth or through an IV.  Medicines that help you feel less sick to your stomach.  Talking to a counselor. Follow these instructions at home: Medicines  Take over-the-counter and prescription medicines only as told by your doctor.  If you are prescribed medicines to help you relax, take them only as told by your doctor. Eating and drinking   Do not drink alcohol.  Drink enough fluid to keep your pee (urine) pale yellow. General instructions  Do not drive or use heavy machinery until your doctor approves.  If you drink a lot and often, do not try to stop drinking without help from your doctor.  Have someone stay with you in case you need help.  Think about joining an alcohol support group.  Keep all follow-up visits as told by your doctor. This is important. Contact a doctor if:  You have symptoms of alcohol withdrawal that get worse or do not go away.  You feel sad (depressed).  You drink alcohol even though you were trying not to.  You cannot eat or drink without vomiting.  You are not able to stop drinking alcohol. Get help right away if:  You feel you may hurt yourself or others.  You have a seizure.  You have a fever.  You get very confused.  You feel or see things that are not there.  You are very sleepy and have trouble staying awake.  You vomit blood.  You shake or tremble very badly.  You sweat a lot.  You feel like your heart is beating too fast, skips a beat, or flutters.  You have chest pain.  You have trouble breathing. If you ever feel like you may hurt yourself or others, or have thoughts about taking your own life, get help right away. You can go to your nearest emergency department or call:  Your local emergency services (911 in the [place]  A suicide crisis helpline, such as the [place] at (458)814-7483. This is open  24 hours a day. These symptoms may be an emergency. Do not wait to see if the symptoms will go away. Get medical help right away. Call your local emergency services (911 in the [place] Do not drive yourself to the hospital. Summary  DTs is the worst kind of alcohol withdrawal. It happens when you stop drinking or you drink less.  DTs can be life-threatening.  You need to be treated in a hospital or a treatment center right away.  The more a person drinks and the longer he or she drinks, the greater the risk of DTs.  Contact a doctor if you drink alcohol even though you were trying not to. This information is not intended to replace advice given to you by your health care provider. Make sure you discuss any questions you have with your health care provider. Document Revised: 11/20/2018 Document Reviewed: 11/20/2018 Elsevier Patient Education  [place] After Stroke A stroke causes damage to the brain cells, which can affect your ability to  walk, talk, and even eat. The impact of a stroke is different for everyone, and so is recovery. A good nutrition plan is important for your recovery. It can also lower your risk of another stroke. If you have difficulty chewing and swallowing your food, a dietitian or your stroke care team can help so that you can enjoy eating healthy foods. What are tips for following this plan?  Reading food labels  Choose foods that have less than 300 milligrams (mg) of sodium per serving. Limit your sodium intake to less than 1,500 mg per day.  Avoid foods that have saturated fat and trans fat.  Choose foods that are low in cholesterol. Limit the amount of cholesterol you eat each day to less than 200 mg.  Choose foods that are high in fiber. Eat 20-30 grams (g) of fiber each day.  Avoid foods with added sugar. Check the food label for ingredients such as sugar, corn syrup, honey, fructose, molasses, and cane juice. Shopping  At the  grocery store, buy most of your food from areas near the walls of the store. This includes: ? Fresh fruits and vegetables. ? Dry grains, beans, nuts, and seeds. ? Fresh seafood, poultry, lean meats, and eggs. ? Low-fat dairy products.  Buy whole ingredients instead of prepackaged foods.  Buy fresh, in-season fruits and vegetables from local farmers markets.  Buy frozen fruits and vegetables in resealable bags. Cooking  Prepare foods with very little salt. Use herbs or salt-free spices instead.  Cook with heart-healthy oils, such as olive, avocado, canola, soybean, or sunflower oil.  Avoid frying foods. Bake, grill, or broil foods instead.  Remove visible fat and skin from meat and poultry before eating.  Modify food textures as told by your health care provider. Meal planning  Eat a wide variety of colorful fruits and vegetables. Make sure one-half of your plate is filled with fruits and vegetables at each meal.  Eat fruits and vegetables that are high in potassium, such as: ? Apples, bananas, oranges, and melon. ? Sweet potatoes, spinach, zucchini, and tomatoes.  Eat fish that contain heart-healthy fats (omega-3 fats) at least twice a week. These include salmon, tuna, mackerel, and sardines.  Eat plant foods that are high in omega-3 fats, such as flaxseeds and walnuts. Add these to cereals, yogurt, or pasta dishes.  Eat several servings of high-fiber foods each day, such as fruits, vegetables, whole grains, and beans.  Do not put salt at the table for meals.  When eating out at restaurants: ? Ask the server about low-salt or salt-free food options. ? Avoid fried foods. Look for menu items that are grilled, steamed, broiled, or roasted. ? Ask if your food can be prepared without butter. ? Ask for condiments, such as salad dressings, gravy, or sauces to be served on the side.  If you have difficulty swallowing: ? Choose foods that are softer and easier to chew and  swallow. ? Cut foods into small pieces and chew well before swallowing. ? Thicken liquids as told by your health care provider or dietitian. ? Let your health care provider know if your condition does not improve over time. You may need to work with a speech therapist to re-train the muscles that are used for eating. General recommendations  Involve your family and friends in your recovery, if possible. It may be helpful to have a slower meal time and to plan meals that include foods everyone in the family can eat.  Brush your teeth  with fluoride toothpaste twice a day, and floss once a day. Keeping a clean mouth can help you swallow and can also help your appetite.  Drink enough water each day to keep your urine pale yellow. If needed, set reminders or ask your family to help you remember to drink water.  Limit alcohol intake to no more than 1 drink a day for nonpregnant women and 2 drinks a day for men. One drink equals 12 oz of beer, 5 oz of wine, or 1 oz of hard liquor. Summary  Following this eating plan can help in your stroke recovery and can decrease your risk for another stroke.  Let your health care provider know if you have problems with swallowing. You may need to work with a speech therapist. This information is not intended to replace advice given to you by your health care provider. Make sure you discuss any questions you have with your health care provider. Document Revised: 02/22/2019 Document Reviewed: 01/09/2018 Elsevier Patient Education  [place].   Delirium Delirium is a state of mental confusion. It comes on quickly and causes significant changes in a person's thinking and behavior. People with delirium usually have trouble paying attention to what is going on or knowing where they are. They may become very withdrawn or very emotional and unable to sit still. They may even see or feel things that are not there (hallucinations). Delirium is a sign of a  serious underlying medical condition. What are the causes? Delirium occurs when something suddenly affects the signals that the brain sends out. Brain signals can be affected by anything that puts severe stress on the body and brain and causes brain chemicals to be out of balance. The most common causes of delirium include:  Infections. These may be bacterial, viral, fungal, or protozoal.  Medicines. These include many over-the-counter and prescription medicines.  Recreational drugs.  Substance withdrawal. This occurs with sudden discontinuation of alcohol, certain medicines, or recreational drugs.  Surgery and anesthesia.  Sudden vascular events, such as stroke and brain hemorrhage.  Other brain disorders, such as migraines, tumors, seizures, and physical head trauma.  Metabolic disorders, such as kidney or liver failure.  Low blood oxygen (anoxia). This may occur with lung disease, cardiac arrest, or carbon monoxide poisoning.  Hormone imbalances (endocrinopathies), such as an overactive thyroid (hyperthyroidism) or underactive thyroid (hypothyroidism).  Vitamin deficiencies. What increases the risk? The following factors may make someone more likely to develop this condition.  Being a child.  Being an older person.  Living alone.  Having vision loss or hearing loss.  Having an existing brain disease, such as dementia.  Having long-lasting (chronic) medical conditions, such as heart disease.  Being hospitalized for long periods of time. What are the signs or symptoms? Delirium starts with a sudden change in a person's thinking or behavior. Symptoms include:  Not being able to stay awake (drowsiness) or pay attention.  Being confused about places, time, and people.  Forgetfulness.  Having extreme energy levels. These may be low or high.  Changes in sleep patterns.  Extreme mood swings, such as sudden anger or anxiety.  Focusing on things or ideas that are not  important.  Rambling and senseless talking.  Difficulty speaking, understanding speech, or both.  Hallucinations.  Tremor or unsteady gait. Symptoms come and go (fluctuate) over time, and they are often worse at the end of the day. How is this diagnosed? People with delirium may not realize that they have the  condition. Often, a family member or health care provider is the first person to notice the changes. This condition may be diagnosed based on a physical exam, health history, and tests.  The health care provider will obtain a detailed history. This may include questions about: ? Current symptoms. ? Medical issues. ? Medicines. ? Recreational drug use.  The health care provider will perform a mental status examination by: ? Asking questions to check for confusion. ? Watching for abnormal behavior.  The health care provider may also order lab tests or additional studies to determine the cause of the delirium. How is this treated? Treatment of delirium depends on the cause and severity. Delirium usually goes away within days or weeks of treating the underlying cause. In the meantime, do not leave the person alone because he or she may accidentally cause self-harm. This condition may be treated with supportive care, such as:  Increased light during the day and decreased light at night.  Low noise level.  Uninterrupted sleep.  A regular daily schedule.  Clocks and calendars to help with orientation.  Familiar objects, including the person's pictures and clothing.  Frequent visits from familiar family and friends.  A healthy diet.  Gentle exercise. In more severe cases of delirium, medicine may be prescribed to help the person keep calm and think more clearly. Follow these instructions at home:  Continue supportive care as told by a health care provider.  Over-the-counter and prescription medicines should be taken only as told by a health care provider.  Ask a health  care provider before using herbs or supplements.  Do not use alcohol or recreational drugs.  Keep all follow-up visits as told by a health care provider. This is important. Contact a health care provider if:  Symptoms do not get better or they become worse.  New symptoms of delirium develop.  Caring for the person at home does not seem safe.  Eating, drinking, or communicating stops.  There are side effects of medicines, such as changes in sleep patterns, dizziness, weight gain, restlessness, movement changes, or tremors. Get help right away if:  Serious thoughts occur about self-harm or about hurting others.  There are serious side effects of medicine, such as: ? Swelling of the face, lips, tongue, or throat. ? Fever, confusion, muscle spasms, or seizures. Summary  Delirium is a state of mental confusion. It comes on quickly and causes significant changes in a person's thinking and behavior.  Delirium is a sign of a serious underlying medical condition.  Certain medical conditions or a long hospital stay may increase the risk of developing delirium.  Treatment of delirium involves treating the underlying cause and providing supportive treatments, such as a calm and familiar environment. This information is not intended to replace advice given to you by your health care provider. Make sure you discuss any questions you have with your health care provider. Document Revised: 06/22/2018 Document Reviewed: 06/22/2018 Elsevier Patient Education  2020 [facility] Discharge After a Stroke  Being discharged from the hospital after a stroke can feel overwhelming. Many things may be different, and it is normal to feel scared or anxious. Some stroke survivors may be able to return to their homes, and others may need more specialized care on a temporary or permanent basis. Your stroke care team will work with you to develop a discharge plan that is best for you. Ask questions  if you do not understand something. Invite a friend or family member  to participate in discharge planning. Understanding and following your discharge plan can help to prevent another stroke or other problems. Understanding your medicines After a stroke, your health care provider may prescribe one or more types of medicine. It is important to take medicines exactly as told by your health care provider. Serious harm, such as another stroke, can happen if you are unable to take your medicine exactly as prescribed. Make sure you understand:  What medicine to take.  Why you are taking the medicine.  How and when to take it.  If it can be taken with your other medicines and herbal supplements.  Possible side effects.  When to call your health care provider if you have any side effects.  How you will get and pay for your medicines. Medical assistance programs may be able to help you pay for prescription medicines if you cannot afford them. If you are taking an anticoagulant, be sure to take it exactly as told by your health care provider. This type of medicine can increase the risk of bleeding because it works to prevent blood from clotting. You may need to take certain precautions to prevent bleeding. You should contact your health care provider if you have:  Bleeding or bruising.  A fall or other injury to your head.  Blood in your urine or stool (feces). Planning for home safety  Take steps to prevent falls, such as installing grab bars or using a shower chair. Ask a friend or family member to get needed things in place before you go home if possible. A therapist can come to your home to make recommendations for safety equipment. Ask your health care provider if you would benefit from this service or from home care. Getting needed equipment Ask your health care provider for a list of any medical equipment and supplies you will need at home. These may include items such  as:  Walkers.  Canes.  Wheelchairs.  Hand-strengthening devices.  Special eating utensils. Medical equipment can be rented or purchased, depending on your insurance coverage. Check with your insurance company about what is covered. Keeping follow-up visits After a stroke, you will need to follow up regularly with a health care provider. You may also need rehabilitation, which can include physical therapy, occupational therapy, or speech-language therapy. Keeping these appointments is very important to your recovery after a stroke. Be sure to bring your medicine list and discharge papers with you to your appointments. If you need help to keep track of your schedule, use a calendar or appointment reminder. Preventing another stroke Having a stroke puts you at risk for another stroke in the future. Ask your health care provider what actions you can take to lower the risk. These may include:  Increasing how much you exercise.  Making a healthy eating plan.  Quitting smoking.  Managing other health conditions, such as high blood pressure, high cholesterol, or diabetes.  Limiting alcohol use. Knowing the warning signs of a stroke  Make sure you understand the signs of a stroke. Before you leave the hospital, you will receive information outlining the stroke warning signs. Share these with your friends and family members. "BE FAST" is an easy way to remember the main warning signs of a stroke:  B - Balance. Signs are dizziness, sudden trouble walking, or loss of balance.  E - Eyes. Signs are trouble seeing or a sudden change in vision.  F - Face. Signs are sudden weakness or numbness of the face, or the  face or eyelid drooping on one side.  A - Arms. Signs are weakness or numbness in an arm. This happens suddenly and usually on one side of the body.  S - Speech. Signs are sudden trouble speaking, slurred speech, or trouble understanding what people say.  T - Time. Time to call  emergency services. Write down what time symptoms started. Other signs of stroke may include:  A sudden, severe headache with no known cause.  Nausea or vomiting.  Seizure. These symptoms may represent a serious problem that is an emergency. Do not wait to see if the symptoms will go away. Get medical help right away. Call your local emergency services (911 in the [place] Do not drive yourself to the hospital. Make note of the time that you had your first symptoms. Your emergency responders or emergency room staff will need to know this information. Summary  Being discharged from the hospital after a stroke can feel overwhelming. It is normal to feel scared or anxious.  Make sure you take medicines exactly as told by your health care provider.  Know the warning signs of a stroke, and get help right way if you have any of these symptoms. "BE FAST" is an easy way to remember the main warning signs of a stroke. This information is not intended to replace advice given to you by your health care provider. Make sure you discuss any questions you have with your health care provider. Document Revised: 07/25/2019 Document Reviewed: 02/04/2017 Elsevier Patient Education  [employer].

## 2020-11-10 NOTE — Progress Notes (Signed)
°   11/10/20 0049  Assess: MEWS Score  Temp (!) 102.2 F (39 C)  BP (!) 138/33  Pulse Rate 83  Resp (!) 28  SpO2 96 %  O2 Device Room Air  Assess: MEWS Score  MEWS Temp 2  MEWS Systolic 0  MEWS Pulse 0  MEWS RR 2  MEWS LOC 0  MEWS Score 4  MEWS Score Color Red  Assess: if the MEWS score is Yellow or Red  Were vital signs taken at a resting state? Yes  Focused Assessment Change from prior assessment (see assessment flowsheet)  Early Detection of Sepsis Score *See Row Information* Low  MEWS guidelines implemented *See Row Information* Yes  Treat  Pain Scale 0-10  Pain Score 0  Patients Stated Pain Goal 0  Take Vital Signs  Increase Vital Sign Frequency  Red: Q 1hr X 4 then Q 4hr X 4, if remains red, continue Q 4hrs  Escalate  MEWS: Escalate Red: discuss with charge nurse/RN and provider, consider discussing with RRT  Document  Patient Outcome Stabilized after interventions

## 2020-11-10 NOTE — Progress Notes (Signed)
Occupational Therapy Treatment Patient Details Name: Alexis Rodriguez MRN: [id number] DOB: 10-25-1941 Today's Date: 11/10/2020    History of present illness presented to ER secondary to AMS, periods of unresponsiveness, questionable seizure-like activity; admitted for TIA/CVA work up.  MRI negative for acute insult.   OT comments  Alexis Rodriguez seems to have declined significantly since her OT evaluation 5 days ago. During today's session, pt was oriented only to self and communicated in with delayed "yes" or "no" answers. Required hand-over-hand assist to engage in grooming and UB bathing. Attempted bed level therex, asking pt to perform 5 ankle circles, but she stopped after three. Requested pt complete 5 UE forward punches; pt was able to complete 2 before disengaging. Pt was unable to assist in bed mobility. O2 sats noted at 89%; informed RN, who stated that pt was at 100% O2 this AM. RN confirmed that pt has "really gone downhill yesterday and today." Alexis Rodriguez will continue to benefit from OT services while hospitalized, working on mobility, increasing strength and endurance, OOB activity if possible, and cognitive reorientation. Based on pt's performance today, DC plans may well need to change to SNF.   Follow Up Recommendations  SNF    Equipment Recommendations       Recommendations for Other Services      Precautions / Restrictions Precautions Precautions: Fall Restrictions Weight Bearing Restrictions: No       Mobility Bed Mobility Overal bed mobility: Needs Assistance Bed Mobility: Rolling Rolling: Max assist            Transfers                 General transfer comment: not attempted, given pt lethargy    Balance     Sitting balance-Leahy Scale: Zero Sitting balance - Comments: pt is unable to come into EOB sitting                                   ADL either performed or assessed with clinical judgement   ADL   Eating/Feeding: Moderate  assistance   Grooming: Moderate assistance   Upper Body Bathing: Maximal assistance                                   Vision Patient Visual Report: No change from baseline     Perception     Praxis      Cognition Arousal/Alertness: Lethargic Behavior During Therapy: Flat affect Overall Cognitive Status: Impaired/Different from baseline Area of Impairment: Attention;Awareness;Orientation                             Problem Solving: Slow processing General Comments: Not oriented to place or time        Exercises Total Joint Exercises Ankle Circles/Pumps: AROM;5 reps;Both Other Exercises Other Exercises: Attempted bed mobility, therex in bed, grooming, UB bathing -- very limited pt engagement   Shoulder Instructions       General Comments      Pertinent Vitals/ Pain       Pain Assessment: No/denies pain  Home Living  Prior Functioning/Environment              Frequency  Min 1X/week        Progress Toward Goals  OT Goals(current goals can now be found in the care plan section)  Progress towards OT goals: Not progressing toward goals - comment  Acute Rehab OT Goals Patient Stated Goal: wants to go home OT Goal Formulation: With patient Time For Goal Achievement: 11/19/20 Potential to Achieve Goals: Alexis Rodriguez Discharge plan needs to be updated;Frequency remains appropriate    Co-evaluation                 AM-PAC OT "6 Clicks" Daily Activity     Outcome Measure   Help from another person eating meals?: A Lot Help from another person taking care of personal grooming?: A Lot Help from another person toileting, which includes using toliet, bedpan, or urinal?: A Lot Help from another person bathing (including washing, rinsing, drying)?: A Lot Help from another person to put on and taking off regular upper body clothing?: A Lot Help from another person to  put on and taking off regular lower body clothing?: A Lot 6 Click Score: 12    End of Session    OT Visit Diagnosis: Unsteadiness on feet (R26.81);Muscle weakness (generalized) (M62.81);Other symptoms and signs involving cognitive function   Activity Tolerance Patient limited by lethargy   Patient Left in bed;with call bell/phone within reach;with bed alarm set   Nurse Communication Mobility status;Other (comment) (O2 sats, pt lethargy)        Time: 8616-8372 OT Time Calculation (min): 12 min  Charges: OT General Charges $OT Visit: 1 Visit OT Treatments $Self Care/Home Management : 8-22 mins  Josiah Lobo, PhD, [place], OTR/L ascom [phone] 11/10/20, 3:57 PM

## 2020-11-11 DIAGNOSIS — N179 Acute kidney failure, unspecified: Secondary | ICD-10-CM

## 2020-11-11 DIAGNOSIS — I442 Atrioventricular block, complete: Secondary | ICD-10-CM | POA: Diagnosis not present

## 2020-11-11 DIAGNOSIS — R2990 Unspecified symptoms and signs involving the nervous system: Secondary | ICD-10-CM | POA: Diagnosis not present

## 2020-11-11 LAB — BASIC METABOLIC PANEL
Anion gap: 5 (ref 5–15)
Anion gap: 9 (ref 5–15)
BUN: 41 mg/dL — ABNORMAL HIGH (ref 8–23)
BUN: 36 mg/dL — ABNORMAL HIGH (ref 8–23)
CO2: 26 mmol/L (ref 22–32)
CO2: 24 mmol/L (ref 22–32)
Calcium: 8 mg/dL — ABNORMAL LOW (ref 8.9–10.3)
Calcium: 8 mg/dL — ABNORMAL LOW (ref 8.9–10.3)
Chloride: 102 mmol/L (ref 98–111)
Chloride: 104 mmol/L (ref 98–111)
Creatinine, Ser: 1.63 mg/dL — ABNORMAL HIGH (ref 0.44–1.00)
Creatinine, Ser: 1.46 mg/dL — ABNORMAL HIGH (ref 0.44–1.00)
GFR, Estimated: 32 mL/min — ABNORMAL LOW (ref >60)
GFR, Estimated: 36 mL/min — ABNORMAL LOW (ref >60)
Glucose, Bld: 181 mg/dL — ABNORMAL HIGH (ref 70–99)
Glucose, Bld: 192 mg/dL — ABNORMAL HIGH (ref 70–99)
Potassium: 4.4 mmol/L (ref 3.5–5.1)
Potassium: 4.4 mmol/L (ref 3.5–5.1)
Sodium: 133 mmol/L — ABNORMAL LOW (ref 135–145)
Sodium: 137 mmol/L (ref 135–145)

## 2020-11-11 LAB — HEPATITIS PANEL, ACUTE
HCV Ab: <0.1 s/co ratio — AB (ref 0.0–0.9)
Hep A IgM: NEGATIVE — AB
Hep B C IgM: NEGATIVE — AB
Hepatitis B Surface Ag: NEGATIVE — AB

## 2020-11-11 LAB — GLUCOSE, CAPILLARY
Glucose-Capillary: 143 mg/dL — ABNORMAL HIGH (ref 70–99)
Glucose-Capillary: 154 mg/dL — ABNORMAL HIGH (ref 70–99)
Glucose-Capillary: 155 mg/dL — ABNORMAL HIGH (ref 70–99)
Glucose-Capillary: 157 mg/dL — ABNORMAL HIGH (ref 70–99)
Glucose-Capillary: 161 mg/dL — ABNORMAL HIGH (ref 70–99)
Glucose-Capillary: 169 mg/dL — ABNORMAL HIGH (ref 70–99)
Glucose-Capillary: 182 mg/dL — ABNORMAL HIGH (ref 70–99)

## 2020-11-11 LAB — RESP PANEL BY RT-PCR (RSV, FLU A&B, COVID)  RVPGX2
Influenza A by PCR: NEGATIVE
Influenza B by PCR: NEGATIVE
Resp Syncytial Virus by PCR: NEGATIVE
SARS Coronavirus 2 by RT PCR: POSITIVE — AB

## 2020-11-11 LAB — RESP PANEL BY RT-PCR (FLU A&B, COVID) ARPGX2
Influenza A by PCR: NEGATIVE
Influenza B by PCR: NEGATIVE
SARS Coronavirus 2 by RT PCR: POSITIVE — AB

## 2020-11-11 LAB — URINE CULTURE: Culture: NO GROWTH

## 2020-11-11 LAB — PROCALCITONIN: Procalcitonin: 0.23 ng/mL

## 2020-11-11 MED ORDER — METHYLPREDNISOLONE SODIUM SUCC 125 MG IJ SOLR
125.0000 mg | Freq: Once | INTRAMUSCULAR | Status: DC | PRN
  Filled 2020-11-11: qty 2

## 2020-11-11 MED ORDER — ALBUTEROL SULFATE HFA 108 (90 BASE) MCG/ACT IN AERS
2.0000 | INHALATION_SPRAY | Freq: Once | RESPIRATORY_TRACT | Status: DC | PRN
  Filled 2020-11-11: qty 6.7

## 2020-11-11 MED ORDER — SODIUM CHLORIDE 0.9 % IV SOLN: INTRAVENOUS | Status: DC | PRN

## 2020-11-11 MED ORDER — SODIUM CHLORIDE 0.9 % IV SOLN
Freq: Once | INTRAVENOUS | Status: DC
  Filled 2020-11-11: qty 20

## 2020-11-11 MED ORDER — EPINEPHRINE 0.3 MG/0.3ML IJ SOAJ
0.3000 mg | Freq: Once | INTRAMUSCULAR | Status: DC | PRN
  Filled 2020-11-11: qty 0.3

## 2020-11-11 MED ORDER — SODIUM CHLORIDE 0.9 % IV SOLN
Freq: Once | INTRAVENOUS | Status: AC
  Filled 2020-11-11: qty 20

## 2020-11-11 MED ORDER — DIPHENHYDRAMINE HCL 50 MG/ML IJ SOLN
50.0000 mg | Freq: Once | INTRAMUSCULAR | Status: DC | PRN
  Filled 2020-11-11: qty 1

## 2020-11-11 MED ORDER — FAMOTIDINE IN NACL 20-0.9 MG/50ML-% IV SOLN
20.0000 mg | Freq: Once | INTRAVENOUS | Status: DC | PRN
  Filled 2020-11-11 (×2): qty 50

## 2020-11-11 NOTE — TOC Progression Note (Addendum)
Transition of Care [facility]) - Progression Note    Patient Details  Name: Alexis Rodriguez MRN: [id number] Date of Birth: [birth year]  Transition of Care [facility] CM/SW [place], LCSW Phone Number: 11/11/2020, 12:19 PM  Clinical Narrative:  Alexis Rodriguez is able to offer a bed and will start insurance authorization. Per MD, hoping for discharge by Thursday if stable. Daughter is aware. She confirmed patient has received her COVID vaccines.  4:41 pm: Patient tested positive for COVID. Daughter said patient hardly ever goes out, is vaccinated, no symptoms. Asked MD to retest in case of false positive.  Expected Discharge Plan: [place] Barriers to Discharge: Continued Medical Work up  Expected Discharge Plan and Services Expected Discharge Plan: [place] Choice: [place] arrangements for the past 2 months: Single Family Home                 DME Arranged: 3-N-1 DME Agency: [employer] Date DME Agency Contacted: 11/05/20   Representative spoke with at DME Agency: [place]: PT,OT           Social Determinants of Health (SDOH) Interventions    Readmission Risk Interventions No flowsheet data found.

## 2020-11-11 NOTE — Consult Note (Signed)
[facility] CONSULT NOTE    Date: 11/11/2020                  Patient Name:  Alexis Rodriguez  MRN: [id number]  DOB: [birth year]  Age / Sex: 79 y.o., female         PCP: Perrin Maltese, MD                 Service Requesting Consult:  Hospitalist                 Reason for Consult:  Acute kidney injury            History of Present Illness: Patient is a 79 y.o. female with a PMHx of hypertension, hyperlipidemia, chronic kidney disease stage IIIa baseline creatinine 1.07 with EGFR 53, anemia of chronic kidney disease with iron deficiency, obesity, dementia who was admitted to [facility] on 11/04/2020 for evaluation of apparent strokelike symptoms.  Patient is a poor historian due to her dementia and cannot offer significant history.  She was having intermittent confusion, slurred speech, and slow response.  She was also found to be hypoglycemic upon presentation with blood glucose of 39 in the emergency department.  Today she was found to be Covid positive.  She was also tested on 11/04/2020 at which point in time she was negative.  We are now asked to see her for evaluation management of acute kidney injury.  Her creatinine was 1.07 on 11/07/2020.  This corresponds to an EGFR 53.  When she presented again to the hospital on 11/10/20 creatinine was 2.59 with an EGFR of 18.  With conservative measures creatinine now down to 1.6 and EGFR back up to 32.  Renal ultrasound performed and was unremarkable.   Medications: Outpatient medications: Medications Prior to Admission  Medication Sig Dispense Refill Last Dose  . aspirin 81 MG EC tablet Take by mouth.   11/04/2020 at Unknown time  . fenofibrate (TRICOR) 145 MG tablet Take 145 mg by mouth daily.   11/04/2020 at Unknown time  . ferrous sulfate 325 (65 FE) MG tablet Take 325 mg by mouth daily.   11/04/2020 at Unknown time  . Magnesium 250 MG TABS Take 250 mg by mouth daily.   11/04/2020 at Unknown time  . melatonin 5 MG TABS Take 5  mg by mouth at bedtime.   11/03/2020 at Unknown time  . NAMZARIC 28-10 MG CP24 Take 1 capsule by mouth daily.   11/04/2020 at Unknown time  . OLANZapine (ZYPREXA) 5 MG tablet Take 5 mg by mouth at bedtime.   11/03/2020 at Unknown time  . sertraline (ZOLOFT) 25 MG tablet Take 25 mg by mouth daily.   11/03/2020 at Unknown time  . amLODipine (NORVASC) 5 MG tablet Take by mouth. (Patient not taking: Reported on 11/04/2020)   Not Taking at Unknown time  . ciprofloxacin (CIPRO) 500 MG tablet Take 500 mg by mouth 2 (two) times daily. (Patient not taking: Reported on 11/04/2020)   Completed Course at Unknown time  . hydrochlorothiazide (HYDRODIURIL) 25 MG tablet TAKE 1 TABLET BY MOUTH EVERY DAY IN THE MORNING (Patient not taking: Reported on 11/04/2020)   Not Taking at Unknown time    Current medications: Current Facility-Administered Medications  Medication Dose Route Frequency Provider Last Rate Last Admin  . 0.9 %  sodium chloride infusion   Intravenous PRN Lorella Nimrod, MD      . acetaminophen (TYLENOL) tablet 650 mg  650 mg Oral Q4H  PRN Ivor Costa, MD   650 mg at 11/11/20 1017   Or  . acetaminophen (TYLENOL) 160 MG/5ML solution 650 mg  650 mg Per Tube Q4H PRN Ivor Costa, MD       Or  . acetaminophen (TYLENOL) suppository 650 mg  650 mg Rectal Q4H PRN Ivor Costa, MD      . albuterol (VENTOLIN HFA) 108 (90 Base) MCG/ACT inhaler 2 puff  2 puff Inhalation Once PRN Lorella Nimrod, MD      . aspirin EC tablet 81 mg  81 mg Oral Daily Ivor Costa, MD   81 mg at 11/11/20 0740  . bamlanivimab 700 mg, etesevimab 1,400 mg in sodium chloride 0.9 % 160 mL IVPB   Intravenous Once Lorella Nimrod, MD      . cefTRIAXone (ROCEPHIN) 2 g in sodium chloride 0.9 % 100 mL IVPB  2 g Intravenous Q24H Lorella Nimrod, MD 200 mL/hr at 11/11/20 0742 2 g at 11/11/20 0742  . Chlorhexidine Gluconate Cloth 2 % PADS 6 each  6 each Topical Daily Lorella Nimrod, MD   6 each at 11/11/20 [phone]  . dextrose 5 %-0.9 % sodium chloride infusion    Intravenous Continuous Lorella Nimrod, MD 50 mL/hr at 11/11/20 1348 Rate Change at 11/11/20 1348  . dextrose 50 % solution 50 mL  50 mL Intravenous PRN Ivor Costa, MD   50 mL at 11/10/20 0800  . diphenhydrAMINE (BENADRYL) injection 50 mg  50 mg Intravenous Once PRN Lorella Nimrod, MD      . enoxaparin (LOVENOX) injection 30 mg  30 mg Subcutaneous Q2200 Dorothe Pea, RPH   30 mg at 11/10/20 2216  . EPINEPHrine (EPI-PEN) injection 0.3 mg  0.3 mg Intramuscular Once PRN Lorella Nimrod, MD      . famotidine (PEPCID) IVPB 20 mg premix  20 mg Intravenous Once PRN Lorella Nimrod, MD      . ferrous sulfate tablet 325 mg  325 mg Oral Daily Ivor Costa, MD   325 mg at 11/11/20 0740  . hydrALAZINE (APRESOLINE) injection 5 mg  5 mg Intravenous Q2H PRN Ivor Costa, MD      . lactated ringers bolus 1,000 mL  1,000 mL Intravenous Once Lang Snow, NP      . magnesium gluconate (MAGONATE) tablet 250 mg  250 mg Oral Daily Benita Gutter, RPH   250 mg at 11/11/20 0740  . melatonin tablet 5 mg  5 mg Oral QHS Ivor Costa, MD   5 mg at 11/10/20 2214  . memantine (NAMENDA XR) 24 hr capsule 28 mg  28 mg Oral QHS Benita Gutter, RPH   28 mg at 11/10/20 2214  . methylPREDNISolone sodium succinate (SOLU-MEDROL) 125 mg/2 mL injection 125 mg  125 mg Intravenous Once PRN Lorella Nimrod, MD      . metroNIDAZOLE (FLAGYL) tablet 500 mg  500 mg Oral Q8H Lorella Nimrod, MD   500 mg at 11/11/20 1404  . OLANZapine (ZYPREXA) tablet 5 mg  5 mg Oral QHS Ivor Costa, MD   5 mg at 11/10/20 2214  . ondansetron (ZOFRAN) injection 4 mg  4 mg Intravenous Q8H PRN Ivor Costa, MD      . rosuvastatin (CRESTOR) tablet 10 mg  10 mg Oral Daily Lorella Nimrod, MD   10 mg at 11/11/20 0740      Allergies: Allergies  Allergen Reactions  . Shellfish Allergy Anaphylaxis  . Penicillins     Other reaction(s): Unknown      Past  Medical History: Past Medical History:  Diagnosis Date  . Dementia ([place])   . Hypertension   . Pancreatitis       Past Surgical History: Past Surgical History:  Procedure Laterality Date  . CHOLECYSTECTOMY       Family History: Family History  Problem Relation Age of Onset  . Diabetes Mellitus II Mother   . Atrial fibrillation Sister      Social History: Social History   Socioeconomic History  . Marital status: Widowed    Spouse name: Not on file  . Number of children: Not on file  . Years of education: Not on file  . Highest education level: Not on file  Occupational History  . Not on file  Tobacco Use  . Smoking status: Never Smoker  . Smokeless tobacco: Never Used  Substance and Sexual Activity  . Alcohol use: Never  . Drug use: Never  . Sexual activity: Not on file  Other Topics Concern  . Not on file  Social History Narrative  . Not on file   Social Determinants of Health   Financial Resource Strain: Not on file  Food Insecurity: Not on file  Transportation Needs: Not on file  Physical Activity: Not on file  Stress: Not on file  Social Connections: Not on file  Intimate Partner Violence: Not on file     Review of Systems: Cannot offer review of systems due to dementia  Vital Signs: Blood pressure (!) 118/39, pulse (!) 45, temperature 99.1 F (37.3 C), temperature source Axillary, resp. rate 18, height $RemoveBe'5\' 3"'MnpCilgyO$  (1.6 m), weight 78.7 kg, SpO2 99 %.  Weight trends: Filed Weights   11/04/20 1340  Weight: 78.7 kg    Physical Exam: Physical Exam: General:  No acute distress  Head:  Normocephalic, atraumatic. Moist oral mucosal membranes  Eyes:  Anicteric  Neck:  Supple  Lungs:   Clear to auscultation, normal effort  Heart:  S1S2 no rubs  Abdomen:   Soft, nontender, bowel sounds present  Extremities:  No peripheral edema.  Neurologic:  Awake, alert, following commands  Skin:  No lesions  Access:  No hemodialysis access present    Lab results: Basic Metabolic Panel: Recent Labs  Lab 11/10/20 1228 11/10/20 1642 11/11/20 0548  NA 129* 128* 133*   K 4.4 4.5 4.4  CL 96* 94* 102  CO2 $Re'23 22 26  'xbr$ GLUCOSE 160* 199* 181*  BUN 48* 50* 41*  CREATININE 1.96* 2.03* 1.63*  CALCIUM 8.3* 8.2* 8.0*  MG  --  1.9  --     Liver Function Tests: Recent Labs  Lab 11/10/20 0217  AST 161*  ALT 54*  ALKPHOS 30*  BILITOT 0.6  PROT 6.9  ALBUMIN 3.4*   No results for input(s): LIPASE, AMYLASE in the last 168 hours. No results for input(s): AMMONIA in the last 168 hours.  CBC: Recent Labs  Lab 11/07/20 0608 11/10/20 0217  WBC 8.9 5.7  NEUTROABS  --  3.7  HGB 13.2 13.0  HCT 38.8 39.2  MCV 89.6 89.9  PLT 271 241    Cardiac Enzymes: No results for input(s): CKTOTAL, CKMB, CKMBINDEX, TROPONINI in the last 168 hours.  BNP: Invalid input(s): POCBNP  CBG: Recent Labs  Lab 11/10/20 2052 11/11/20 0025 11/11/20 0437 11/11/20 0750 11/11/20 1209  GLUCAP 174* 154* 155* 161* 143*    Microbiology: Results for orders placed or performed during the hospital encounter of 11/04/20  Resp Panel by RT-PCR (Flu A&B, Covid) Nasopharyngeal Swab  Status: None   Collection Time: 11/04/20  3:14 PM   Specimen: Nasopharyngeal Swab; Nasopharyngeal(NP) swabs in vial transport medium  Result Value Ref Range Status   SARS Coronavirus 2 by RT PCR NEGATIVE NEGATIVE Final    Comment: (NOTE) SARS-CoV-2 target nucleic acids are NOT DETECTED.  The SARS-CoV-2 RNA is generally detectable in upper respiratory specimens during the acute phase of infection. The lowest concentration of SARS-CoV-2 viral copies this assay can detect is 138 copies/mL. A negative result does not preclude SARS-Cov-2 infection and should not be used as the sole basis for treatment or other patient management decisions. A negative result may occur with  improper specimen collection/handling, submission of specimen other than nasopharyngeal swab, presence of viral mutation(s) within the areas targeted by this assay, and inadequate number of viral copies(<138 copies/mL). A  negative result must be combined with clinical observations, patient history, and epidemiological information. The expected result is Negative.  Fact Sheet for Patients:  EntrepreneurPulse.com.au  Fact Sheet for Healthcare Providers:  IncredibleEmployment.be  This test is no t yet approved or cleared by the [place] FDA and  has been authorized for detection and/or diagnosis of SARS-CoV-2 by FDA under an Emergency Use Authorization (EUA). This EUA will remain  in effect (meaning this test can be used) for the duration of the COVID-19 declaration under Section 564(b)(1) of the Act, 21 U.S.C.section 360bbb-3(b)(1), unless the authorization is terminated  or revoked sooner.       Influenza A by PCR NEGATIVE NEGATIVE Final   Influenza B by PCR NEGATIVE NEGATIVE Final    Comment: (NOTE) The Xpert Xpress SARS-CoV-2/FLU/RSV plus assay is intended as an aid in the diagnosis of influenza from Nasopharyngeal swab specimens and should not be used as a sole basis for treatment. Nasal washings and aspirates are unacceptable for Xpert Xpress SARS-CoV-2/FLU/RSV testing.  Fact Sheet for Patients: EntrepreneurPulse.com.au  Fact Sheet for Healthcare Providers: IncredibleEmployment.be  This test is not yet approved or cleared by the [place] FDA and has been authorized for detection and/or diagnosis of SARS-CoV-2 by FDA under an Emergency Use Authorization (EUA). This EUA will remain in effect (meaning this test can be used) for the duration of the COVID-19 declaration under Section 564(b)(1) of the Act, 21 U.S.C. section 360bbb-3(b)(1), unless the authorization is terminated or revoked.  Performed at [facility], [address] [place], [place] [address]   Urine Culture     Status: Abnormal   Collection Time: 11/04/20  3:42 PM   Specimen: Urine, Random  Result Value Ref Range Status   Specimen  Description   Final    URINE, RANDOM Performed at [facility], [address], [place], [place] [address]    Special Requests   Final    NONE Performed at [facility], [address] [place], [place] [address]    Culture MULTIPLE SPECIES PRESENT, SUGGEST RECOLLECTION (A)  Final   Report Status 11/06/2020 FINAL  Final  Culture, blood (x 2)     Status: None (Preliminary result)   Collection Time: 11/10/20  2:17 AM   Specimen: BLOOD  Result Value Ref Range Status   Specimen Description BLOOD BLOOD RIGHT HAND  Final   Special Requests   Final    BOTTLES DRAWN AEROBIC AND ANAEROBIC Blood Culture results may not be optimal due to an inadequate volume of blood received in culture bottles   Culture   Final    NO GROWTH 1 DAY Performed at [facility],  [address] [place], [place] [address]    Report Status PENDING  Incomplete  Culture, blood (x 2)     Status: None (Preliminary result)   Collection Time: 11/10/20  2:18 AM   Specimen: BLOOD  Result Value Ref Range Status   Specimen Description BLOOD BLOOD LEFT HAND  Final   Special Requests   Final    BOTTLES DRAWN AEROBIC AND ANAEROBIC Blood Culture adequate volume   Culture   Final    NO GROWTH 1 DAY Performed at [facility], [address], [place], [place] [address]    Report Status PENDING  Incomplete  Culture, Urine     Status: None   Collection Time: 11/10/20  4:49 AM   Specimen: Urine, Random  Result Value Ref Range Status   Specimen Description   Final    URINE, RANDOM Performed at [facility], [address] [place], [place] [address]    Special Requests   Final    NONE Performed at [facility], [address], [place], [place] [address]    Culture   Final    NO GROWTH Performed at [facility], [address] [place], [place] [address]    Report Status 11/11/2020 FINAL  Final  Resp Panel by RT-PCR (Flu A&B, Covid) Nasopharyngeal Swab      Status: Abnormal   Collection Time: 11/11/20 12:30 PM   Specimen: Nasopharyngeal Swab; Nasopharyngeal(NP) swabs in vial transport medium  Result Value Ref Range Status   SARS Coronavirus 2 by RT PCR POSITIVE (A) NEGATIVE Final    Comment: RESULT CALLED TO, READ BACK BY AND VERIFIED WITH:  TIFFANY ROBINSON 1336 11/11/20 SDR (NOTE) SARS-CoV-2 target nucleic acids are DETECTED.  The SARS-CoV-2 RNA is generally detectable in upper respiratory specimens during the acute phase of infection. Positive results are indicative of the presence of the identified virus, but do not rule out bacterial infection or co-infection with other pathogens not detected by the test. Clinical correlation with patient history and other diagnostic information is necessary to determine patient infection status. The expected result is Negative.  Fact Sheet for Patients: BloggerCourse.com  Fact Sheet for Healthcare Providers: SeriousBroker.it  This test is not yet approved or cleared by the [place] FDA and  has been authorized for detection and/or diagnosis of SARS-CoV-2 by FDA under an Emergency Use Authorization (EUA).  This EUA will remain in effect (meaning this test can  be used) for the duration of  the COVID-19 declaration under Section 564(b)(1) of the Act, 21 U.S.C. section 360bbb-3(b)(1), unless the authorization is terminated or revoked sooner.     Influenza A by PCR NEGATIVE NEGATIVE Final   Influenza B by PCR NEGATIVE NEGATIVE Final    Comment: (NOTE) The Xpert Xpress SARS-CoV-2/FLU/RSV plus assay is intended as an aid in the diagnosis of influenza from Nasopharyngeal swab specimens and should not be used as a sole basis for treatment. Nasal washings and aspirates are unacceptable for Xpert Xpress SARS-CoV-2/FLU/RSV testing.  Fact Sheet for Patients: BloggerCourse.com  Fact Sheet for Healthcare  Providers: SeriousBroker.it  This test is not yet approved or cleared by the [place] FDA and has been authorized for detection and/or diagnosis of SARS-CoV-2 by FDA under an Emergency Use Authorization (EUA). This EUA will remain in effect (meaning this test can be used) for the duration of the COVID-19 declaration under Section 564(b)(1) of the Act, 21 U.S.C. section 360bbb-3(b)(1), unless the authorization is terminated or revoked.  Performed  at [facility], [address] [place], [place] [address]     Coagulation Studies: Recent Labs    11/10/20 0217  LABPROT 15.8*  INR 1.3*    Urinalysis: Recent Labs    11/10/20 0449  COLORURINE YELLOW*  LABSPEC 1.010  PHURINE 5.0  GLUCOSEU NEGATIVE  HGBUR NEGATIVE  BILIRUBINUR NEGATIVE  KETONESUR NEGATIVE  PROTEINUR NEGATIVE  NITRITE NEGATIVE  LEUKOCYTESUR NEGATIVE      Imaging: DG Chest 1 View  Result Date: 11/10/2020 CLINICAL DATA:  Sepsis. EXAM: CHEST  1 VIEW COMPARISON:  Two-view chest x-ray 11/09/2020 FINDINGS: Heart size is normal. Atherosclerotic changes are noted at the aortic arch. Lower lobe airspace opacity is improving. No new airspace disease present. Lung volumes are low. Axial skeleton is within normal limits. Surgical clips are present at the gallbladder fossa. IMPRESSION: 1. Improving bibasilar airspace disease. 2. No new airspace disease. 3. Low lung volumes 4. Aortic atherosclerosis. Electronically Signed   By: San Morelle M.D.   On: 11/10/2020 01:37   US RENAL  Result Date: 11/10/2020 CLINICAL DATA:  AKI EXAM: RENAL / URINARY TRACT ULTRASOUND COMPLETE COMPARISON:  11/05/2020 and prior. FINDINGS: Right Kidney: Renal measurements: 10.0 x 4.7 x 4.7 cm = volume: 115.0 mL. Echogenicity within normal limits. No mass or hydronephrosis visualized. Left Kidney: Renal measurements: 10.2 x 5.3 x 4.5 cm = volume: 126.3 mL. Echogenicity within normal limits. No mass or  hydronephrosis visualized. Bladder: Decompressed. Other: None. IMPRESSION: Unremarkable renal ultrasound. Electronically Signed   By: Primitivo Gauze M.D.   On: 11/10/2020 12:04      Assessment & Plan: Pt is a 79 y.o. female with a PMHx of hypertension, hyperlipidemia, chronic kidney disease stage IIIa baseline creatinine 1.07 with EGFR 53, anemia of chronic kidney disease with iron deficiency, obesity, dementia who was admitted to [facility] on 11/04/2020 for evaluation of apparent strokelike symptoms.  1.  Acute kidney injury/chronic kidney disease stage IIIa baseline EGFR 53.  Suspect acute kidney injury related to dehydration.  With IV fluid hydration renal function has improved.  Renal ultrasound unremarkable.  Avoid nephrotoxins as possible.  No indication for dialysis.  2.  COVID-19 pneumonia.  Patient being administered multiple antibiotics as well as steroids.

## 2020-11-11 NOTE — Progress Notes (Signed)
PT Cancellation Note  Patient Details Name: Alexis Rodriguez MRN: [id number] DOB: 1941-06-01   Cancelled Treatment:     Therapist by to see pt who was experiencing an irregular heart rate, continued temp, and determined to be unstable for PT today per nursing.   Josie Dixon 11/11/2020, 12:00 PM

## 2020-11-11 NOTE — Progress Notes (Signed)
Patient re-swabbed for Covid and placed on airborne precautions per protocol for r/o. Patient's family arrived for visitation but advised of updated care plan. Will notify family if COVID results are negative so they can return to visit.

## 2020-11-11 NOTE — Progress Notes (Addendum)
PROGRESS NOTE    Alexis Rodriguez  WUJ:[id number] DOB: [birth year] DOA: 11/04/2020 PCP: Perrin Maltese, MD   Brief Narrative: Taken from H&P. Alexis Rodriguez is a 79 y.o. female with medical history significant of hypertension, hyperlipidemia, dementia, iron deficiency anemia, CKD-3, who presents with strokelike symptoms.  Per her daughter, patient has strokelike symptoms for about 1 month. Symptoms have been going on intermittently, including confusion, blurry vision, slurred speech, slow response with staring off. No episode of fully loss of consciousness. No facial droop. Patient moves all extremities. Patient does not have chest pain, cough, shortness breath, fever or chills. No nausea, vomiting, diarrhea, abdominal pain, symptoms of UTI.   She was also found to have hypoglycemia without any prior diagnosis of diabetes or being on any antidiabetic medications. CT head and MRI without any acute infarct.  Continue to drop blood glucose level.  Requiring D10. Neurology was consulted-recommending stroke work-up and EEG, which was negative. Persistent hyperinsulinism hypoglycemia, rest of the labs pending. Responded well to glucagon and octreotide in the beginning, later maintaining blood glucose on diazoxide, which was discontinued due to urinary retention and she was restarted on octreotide. Patient developed bradycardia and become febrile on 11/10/2020, cardiology was consulted and they recommend discontinuing octreotide stating that it can cause conduction defects although patient just received 1 dose and bradycardia was present before getting that octreotide. Also developed hyponatremia, responding well to volume repletion. Blood glucose level currently maintained on normal saline with D5.  Cardiology repeated Covid testing today and it came back positive.  Negative on admission.  Monoclonal antibody ordered.  Subjective: Patient continued to appear lethargic but denying any complaint.  No chest  pain or shortness of breath.  Assessment & Plan:   Principal Problem:   Stroke-like symptoms Active Problems:   HTN (hypertension)   Hypoglycemia   Dementia (HCC)   HLD (hyperlipidemia)   Iron deficiency anemia   CKD (chronic kidney disease), stage IIIa   Depression   Pressure injury of skin  Stroke right symptoms/persistent intermittent hypoglycemia.  Imaging is negative for any acute infarct.  Echocardiogram with normal EF and grade 1 diastolic dysfunction.  A1c of 4.7.  EEG without any epileptiform changes. Very vague symptoms might be due to intermittent hypoglycemia. Intermittent symptoms for 1 month, where she intermittently seems more confused, staring and sometimes sweating.  Per daughter she is having good p.o. intake and ready consistent with her routine. Patient has underlying dementia and is oriented to self only at baseline. Patient responded well to glucagon challenge and later blood sugar increased above 200 with octreotide subcu. had curbside discussion with endocrinology and our pharmacist and decided to start her on diazoxide, developed urinary retention requiring Foley catheter, Diazoxide was discontinued and plan was to restart octreotide, received 1 dose of octreotide and there was some concern of conduction abnormalities with 1:2 block, cardiology advised to discontinue octreotide.  Currently on normal saline with D5 and maintaining blood glucose level. Insulin and C-peptide elevated. Sulfonylurea , pro insuline, insulin antibodies levels still pending. A.m. cortisol mildly elevated. TSH within normal limit Pancreatic protocol MRI without any significant abnormality. 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 -Continue CBG monitoring. -Talked with Dr. Gabriel Carina from endocrinology.  She will call back with her suggestions without diazoxide or octreotide after discussing with her colleague  Fever. Patient become febrile with fever up to 102.2 and  shakiness  CBC with no leukocytosis, no lactic acidosis, procalcitonin at 0.48, blood cultures remain negative after I  day. UA does not look infected but cultures are pending. CMP with AKI and transaminitis along with hyponatremia. Remained febrile over the past 24-hour.  Antibiotics were added for concern of aspiration although chest x-ray without any acute changes. -Continue Rocephin and Flagyl-allergic to amoxicillin. -Follow-up blood and urine cultures-remain negative.  COVID-19 positive.  Cardiology reswabbed patient today and it came back positive for COVID-19. -Put her on precautions. -Monoclonal antibody ordered. -Check procalcitonin and if negative can discontinue antibiotics.  Bradycardia/conduction abnormalities.  Patient's baseline heart rate was in 50s, decreased to 40s and telemetry called with concern of heart block. Cardiology was consulted and they really think that octreotide is playing a role advising discontinuation of octreotide. Patient just received 1 dose of octreotide yesterday around noon. We will monitor patient without octreotide.  Hyponatremia.  New onset hyponatremia.  Sodium nadir at 126, hyponatremia labs consistent with hypovolemic hyponatremia, responding appropriately with normal saline.  Zoloft was discontinued. -Continue to monitor  AKI and urinary retention/CKD stage IIIa.  Patient developed urinary retention and AKI. Overnight in and out with more than 600 cc.  No urinary output this morning so bladder scan done and it shows more than 1 L.  Foley catheter was placed. Creatinine started improving.  Renal ultrasound within normal limit. -Monitor kidney functions. -Avoid nephrotoxins  Cough.  Per nursing concern that she developed new cough over the past 24-hour.  Remained afebrile.  Concern of aspiration. -Chest x-ray-bilateral atelectasis, no obvious infiltrate. -Speech and swallow evaluation-concern of aspiration with thin liquids, diet changed to  nectar thick.  Physical deconditioning.  PT recommending SNF.  Family is agreeable. -TOC started working on it.  Pyuria.  No urinary symptoms.  Urine cultures are pending.Per daughter received recent 3 day course of cipro.by PCP for aspiration UTI. - urine cultures-multiple species. -Repeat urine culture is negative.  Hypertension.  She was not on any antihypertensives at home.  Blood pressure within goal today. -Continue to monitor. -Hydralazine as needed for systolic above 834.  Depression.  No acute concern. -Continue home dose of  Olanzapine. -Discontinue Zoloft as she developed hyponatremia.  Dementia ([place]) -Continue Namzaric  HLD (hyperlipidemia) -Continue Tricor  Iron deficiency anemia: Hemoglobin 13.7 -Continue iron supplement  Objective: Vitals:   11/10/20 1829 11/10/20 2023 11/11/20 0029 11/11/20 0440  BP: (!) 130/44 (!) 124/43 (!) 123/43 (!) 130/45  Pulse:  (!) 44 (!) 41 (!) 44  Resp:  14 15 14   Temp: (!) 100.7 F (38.2 C) 99.2 F (37.3 C) 98.9 F (37.2 C) 100.2 F (37.9 C)  TempSrc: Axillary     SpO2:  99% 100% 100%  Weight:      Height:        Intake/Output Summary (Last 24 hours) at 11/11/2020 0744 Last data filed at 11/11/2020 0446 Gross per 24 hour  Intake 788.55 ml  Output 1650 ml  Net -861.45 ml   Filed Weights   11/04/20 1340  Weight: 78.7 kg    Examination:  General.  Chronically ill-appearing elderly lady, in no acute distress. Pulmonary.  Lungs clear bilaterally, normal respiratory effort. CV.  Sinus bradycardia, no JVD, rub or murmur. Abdomen.  Soft, nontender, nondistended, BS positive. CNS.  Alert and oriented x1.  No focal neurologic deficit. Extremities.  No edema, no cyanosis, pulses intact and symmetrical. Psychiatry.  Judgment and insight appears impaired.  DVT prophylaxis: Lovenox Code Status: Full Family Communication: Discussed with daughter on phone. Disposition Plan:  Status is: Inpatient  Remains inpatient  appropriate because:Inpatient level of  care appropriate due to severity of illness   Dispo: The patient is from: Home              Anticipated d/c is to: SNF              Anticipated d/c date is: 1-2 days.              Patient currently is not medically stable.  TOC is looking for placement. Patient developed new onset fever, urinary retention and conduction abnormalities.  Consultants:   Neurology  Curbside endocrinology  Cardiology  Procedures:  Antimicrobials:  Rocephin Flagyl  Data Reviewed: I have personally reviewed following labs and imaging studies  CBC: Recent Labs  Lab 11/04/20 1345 11/07/20 0608 11/10/20 0217  WBC 9.5 8.9 5.7  NEUTROABS 6.3  --  3.7  HGB 13.7 13.2 13.0  HCT 41.5 38.8 39.2  MCV 92.2 89.6 89.9  PLT 306 271 284   Basic Metabolic Panel: Recent Labs  Lab 11/10/20 0217 11/10/20 0500 11/10/20 1228 11/10/20 1642 11/11/20 0548  NA 129* 126* 129* 128* 133*  K 4.4 3.9 4.4 4.5 4.4  CL 93* 94* 96* 94* 102  CO2 24 23 23 22 26   GLUCOSE 108* 85 160* 199* 181*  BUN 52* 50* 48* 50* 41*  CREATININE 2.59* 2.19* 1.96* 2.03* 1.63*  CALCIUM 8.9 8.2* 8.3* 8.2* 8.0*  MG  --   --   --  1.9  --    GFR: Estimated Creatinine Clearance: 27.3 mL/min (A) (by C-G formula based on SCr of 1.63 mg/dL (H)). Liver Function Tests: Recent Labs  Lab 11/04/20 1345 11/10/20 0217  AST 47* 161*  ALT 25 54*  ALKPHOS 35* 30*  BILITOT 0.5 0.6  PROT 7.8 6.9  ALBUMIN 3.8 3.4*   No results for input(s): LIPASE, AMYLASE in the last 168 hours. No results for input(s): AMMONIA in the last 168 hours. Coagulation Profile: Recent Labs  Lab 11/04/20 1345 11/10/20 0217  INR 1.1 1.3*   Cardiac Enzymes: No results for input(s): CKTOTAL, CKMB, CKMBINDEX, TROPONINI in the last 168 hours. BNP (last 3 results) No results for input(s): PROBNP in the last 8760 hours. HbA1C: No results for input(s): HGBA1C in the last 72 hours. CBG: Recent Labs  Lab 11/10/20 1608  11/10/20 1708 11/10/20 2052 11/11/20 0025 11/11/20 0437  GLUCAP 197* 174* 174* 154* 155*   Lipid Profile: No results for input(s): CHOL, HDL, LDLCALC, TRIG, CHOLHDL, LDLDIRECT in the last 72 hours. Thyroid Function Tests: Recent Labs    11/10/20 0500  TSH 2.774   Anemia Panel: No results for input(s): VITAMINB12, FOLATE, FERRITIN, TIBC, IRON, RETICCTPCT in the last 72 hours. Sepsis Labs: Recent Labs  Lab 11/10/20 0217 11/10/20 0500  PROCALCITON 0.48  --   LATICACIDVEN 1.6 1.5    Recent Results (from the past 240 hour(s))  Resp Panel by RT-PCR (Flu A&B, Covid) Nasopharyngeal Swab     Status: None   Collection Time: 11/04/20  3:14 PM   Specimen: Nasopharyngeal Swab; Nasopharyngeal(NP) swabs in vial transport medium  Result Value Ref Range Status   SARS Coronavirus 2 by RT PCR NEGATIVE NEGATIVE Final    Comment: (NOTE) SARS-CoV-2 target nucleic acids are NOT DETECTED.  The SARS-CoV-2 RNA is generally detectable in upper respiratory specimens during the acute phase of infection. The lowest concentration of SARS-CoV-2 viral copies this assay can detect is 138 copies/mL. A negative result does not preclude SARS-Cov-2 infection and should not be used as the sole basis for  treatment or other patient management decisions. A negative result may occur with  improper specimen collection/handling, submission of specimen other than nasopharyngeal swab, presence of viral mutation(s) within the areas targeted by this assay, and inadequate number of viral copies(<138 copies/mL). A negative result must be combined with clinical observations, patient history, and epidemiological information. The expected result is Negative.  Fact Sheet for Patients:  EntrepreneurPulse.com.au  Fact Sheet for Healthcare Providers:  IncredibleEmployment.be  This test is no t yet approved or cleared by the [place] FDA and  has been authorized for detection  and/or diagnosis of SARS-CoV-2 by FDA under an Emergency Use Authorization (EUA). This EUA will remain  in effect (meaning this test can be used) for the duration of the COVID-19 declaration under Section 564(b)(1) of the Act, 21 U.S.C.section 360bbb-3(b)(1), unless the authorization is terminated  or revoked sooner.       Influenza A by PCR NEGATIVE NEGATIVE Final   Influenza B by PCR NEGATIVE NEGATIVE Final    Comment: (NOTE) The Xpert Xpress SARS-CoV-2/FLU/RSV plus assay is intended as an aid in the diagnosis of influenza from Nasopharyngeal swab specimens and should not be used as a sole basis for treatment. Nasal washings and aspirates are unacceptable for Xpert Xpress SARS-CoV-2/FLU/RSV testing.  Fact Sheet for Patients: EntrepreneurPulse.com.au  Fact Sheet for Healthcare Providers: IncredibleEmployment.be  This test is not yet approved or cleared by the [place] FDA and has been authorized for detection and/or diagnosis of SARS-CoV-2 by FDA under an Emergency Use Authorization (EUA). This EUA will remain in effect (meaning this test can be used) for the duration of the COVID-19 declaration under Section 564(b)(1) of the Act, 21 U.S.C. section 360bbb-3(b)(1), unless the authorization is terminated or revoked.  Performed at [facility], [address] [place], [place] [address]   Urine Culture     Status: Abnormal   Collection Time: 11/04/20  3:42 PM   Specimen: Urine, Random  Result Value Ref Range Status   Specimen Description   Final    URINE, RANDOM Performed at [facility], [address] [place], [place] [address]    Special Requests   Final    NONE Performed at [facility], [address] [place], [place] [address]    Culture MULTIPLE SPECIES PRESENT, SUGGEST RECOLLECTION (A)  Final   Report Status 11/06/2020 FINAL  Final  Culture, blood (x 2)     Status: None (Preliminary result)    Collection Time: 11/10/20  2:17 AM   Specimen: BLOOD  Result Value Ref Range Status   Specimen Description BLOOD BLOOD RIGHT HAND  Final   Special Requests   Final    BOTTLES DRAWN AEROBIC AND ANAEROBIC Blood Culture results may not be optimal due to an inadequate volume of blood received in culture bottles   Culture   Final    NO GROWTH 1 DAY Performed at [facility], [address], [place],  [address]    Report Status PENDING  Incomplete  Culture, blood (x 2)     Status: None (Preliminary result)   Collection Time: 11/10/20  2:18 AM   Specimen: BLOOD  Result Value Ref Range Status   Specimen Description BLOOD BLOOD LEFT HAND  Final   Special Requests   Final    BOTTLES DRAWN AEROBIC AND ANAEROBIC Blood Culture adequate volume   Culture   Final    NO GROWTH 1 DAY Performed at [facility] [address] [place], [place]  [address]    Report Status PENDING  Incomplete     Radiology Studies: DG Chest 1 View  Result Date: 11/10/2020 CLINICAL DATA:  Sepsis. EXAM: CHEST  1 VIEW COMPARISON:  Two-view chest x-ray 11/09/2020 FINDINGS: Heart size is normal. Atherosclerotic changes are noted at the aortic arch. Lower lobe airspace opacity is improving. No new airspace disease present. Lung volumes are low. Axial skeleton is within normal limits. Surgical clips are present at the gallbladder fossa. IMPRESSION: 1. Improving bibasilar airspace disease. 2. No new airspace disease. 3. Low lung volumes 4. Aortic atherosclerosis. Electronically Signed   By: San Morelle M.D.   On: 11/10/2020 01:37   DG Chest 2 View  Result Date: 11/09/2020 CLINICAL DATA:  Cough EXAM: CHEST - 2 VIEW COMPARISON:  02/01/2013 FINDINGS: Cardiac shadow is within normal limits. Aortic calcifications are seen. The lungs are well aerated bilaterally. Minimal bibasilar atelectasis is seen left greater than right. No sizable effusion is noted. No bony abnormality is seen. IMPRESSION:  Mild bibasilar atelectasis. Electronically Signed   By: Inez Catalina M.D.   On: 11/09/2020 13:11   US RENAL  Result Date: 11/10/2020 CLINICAL DATA:  AKI EXAM: RENAL / URINARY TRACT ULTRASOUND COMPLETE COMPARISON:  11/05/2020 and prior. FINDINGS: Right Kidney: Renal measurements: 10.0 x 4.7 x 4.7 cm = volume: 115.0 mL. Echogenicity within normal limits. No mass or hydronephrosis visualized. Left Kidney: Renal measurements: 10.2 x 5.3 x 4.5 cm = volume: 126.3 mL. Echogenicity within normal limits. No mass or hydronephrosis visualized. Bladder: Decompressed. Other: None. IMPRESSION: Unremarkable renal ultrasound. Electronically Signed   By: Primitivo Gauze M.D.   On: 11/10/2020 12:04    Scheduled Meds: . aspirin EC  81 mg Oral Daily  . Chlorhexidine Gluconate Cloth  6 each Topical Daily  . enoxaparin (LOVENOX) injection  30 mg Subcutaneous Q2200  . ferrous sulfate  325 mg Oral Daily  . magnesium gluconate  250 mg Oral Daily  . melatonin  5 mg Oral QHS  . memantine  28 mg Oral QHS  . metroNIDAZOLE  500 mg Oral Q8H  . OLANZapine  5 mg Oral QHS  . rosuvastatin  10 mg Oral Daily   Continuous Infusions: . cefTRIAXone (ROCEPHIN)  IV 2 g (11/11/20 0742)  . dextrose 5 % and 0.9% NaCl 100 mL/hr at 11/11/20 0646  . lactated ringers       LOS: 6 days   Time spent: 40 minutes.  Lorella Nimrod, MD [facility]  If 7PM-7AM, please contact night-coverage [email or web address]  11/11/2020, 7:44 AM   This record has been created using Systems analyst. Errors have been sought and corrected,but may not always be located. Such creation errors do not reflect on the standard of care.

## 2020-11-11 NOTE — Progress Notes (Addendum)
Progress Note  Patient Name: Alexis Rodriguez Date of Encounter: 11/11/2020  Primary Cardiologist: New to [facility] - consult by End  Subjective   Slightly more lucid today.  She denies any chest pain, dyspnea, or palpitations.  No dizziness, presyncope, or syncope.  T-max 100.7 F over the past 24 hours.  Renal function improving.  High-sensitivity troponin peaked at 174 and is currently downtrending.  Inpatient Medications    Scheduled Meds: . aspirin EC  81 mg Oral Daily  . Chlorhexidine Gluconate Cloth  6 each Topical Daily  . enoxaparin (LOVENOX) injection  30 mg Subcutaneous Q2200  . ferrous sulfate  325 mg Oral Daily  . magnesium gluconate  250 mg Oral Daily  . melatonin  5 mg Oral QHS  . memantine  28 mg Oral QHS  . metroNIDAZOLE  500 mg Oral Q8H  . OLANZapine  5 mg Oral QHS  . rosuvastatin  10 mg Oral Daily   Continuous Infusions: . cefTRIAXone (ROCEPHIN)  IV 2 g (11/11/20 0742)  . dextrose 5 % and 0.9% NaCl 100 mL/hr at 11/11/20 0646  . lactated ringers     PRN Meds: acetaminophen **OR** acetaminophen (TYLENOL) oral liquid 160 mg/5 mL **OR** acetaminophen, dextrose, hydrALAZINE, ondansetron (ZOFRAN) IV   Vital Signs    Vitals:   11/10/20 2023 11/11/20 0029 11/11/20 0440 11/11/20 0745  BP: (!) 124/43 (!) 123/43 (!) 130/45 (!) 128/53  Pulse: (!) 44 (!) 41 (!) 44 (!) 44  Resp: 14 15 14 18   Temp: 99.2 F (37.3 C) 98.9 F (37.2 C) 100.2 F (37.9 C) 99.4 F (37.4 C)  TempSrc:    Axillary  SpO2: 99% 100% 100% 98%  Weight:      Height:        Intake/Output Summary (Last 24 hours) at 11/11/2020 0923 Last data filed at 11/11/2020 0446 Gross per 24 hour  Intake 788.55 ml  Output 1650 ml  Net -861.45 ml   Filed Weights   11/04/20 1340  Weight: 78.7 kg    Telemetry    Sinus bradycardia with brief episode of third-degree heart block - Personally Reviewed  ECG    Sinus bradycardia, 46 bpm, PVCs and intermittent Mobitz type I second-degree AV block and 2:1 AV  block, possible inferior/posterior MI is present as well as lateral TWI  - Personally Reviewed  Physical Exam   GEN: No acute distress.   Neck: No JVD. Cardiac:  Bradycardic, no murmurs, rubs, or gallops.  Respiratory:  Mildly diminished throughout.  GI: Soft, nontender, non-distended.   MS: No edema; No deformity. Neuro:  Alert and oriented x 2 (person and place); Nonfocal.  Psych: Normal affect.  Labs    Chemistry Recent Labs  Lab 11/04/20 1345 11/07/20 6568 11/10/20 0217 11/10/20 0500 11/10/20 1228 11/10/20 1642 11/11/20 0548  NA 137   < > 129*   < > 129* 128* 133*  K 4.1   < > 4.4   < > 4.4 4.5 4.4  CL 100   < > 93*   < > 96* 94* 102  CO2 28   < > 24   < > 23 22 26   GLUCOSE 39*   < > 108*   < > 160* 199* 181*  BUN 14   < > 52*   < > 48* 50* 41*  CREATININE 1.20*   < > 2.59*   < > 1.96* 2.03* 1.63*  CALCIUM 9.2   < > 8.9   < > 8.3* 8.2*  8.0*  PROT 7.8  --  6.9  --   --   --   --   ALBUMIN 3.8  --  3.4*  --   --   --   --   AST 47*  --  161*  --   --   --   --   ALT 25  --  54*  --   --   --   --   ALKPHOS 35*  --  30*  --   --   --   --   BILITOT 0.5  --  0.6  --   --   --   --   GFRNONAA 46*   < > 18*   < > 25* 24* 32*  ANIONGAP 9   < > 12   < > 10 12 5    < > = values in this interval not displayed.     Hematology Recent Labs  Lab 11/04/20 1345 11/07/20 0608 11/10/20 0217  WBC 9.5 8.9 5.7  RBC 4.50 4.33 4.36  HGB 13.7 13.2 13.0  HCT 41.5 38.8 39.2  MCV 92.2 89.6 89.9  MCH 30.4 30.5 29.8  MCHC 33.0 34.0 33.2  RDW 13.2 12.4 13.2  PLT 306 271 241    Cardiac EnzymesNo results for input(s): TROPONINI in the last 168 hours. No results for input(s): TROPIPOC in the last 168 hours.   BNPNo results for input(s): BNP, PROBNP in the last 168 hours.   DDimer No results for input(s): DDIMER in the last 168 hours.   Radiology    DG Chest 1 View  Result Date: 11/10/2020 IMPRESSION: 1. Improving bibasilar airspace disease. 2. No new airspace disease. 3. Low  lung volumes 4. Aortic atherosclerosis. Electronically Signed   By: San Morelle M.D.   On: 11/10/2020 01:37   DG Chest 2 View  Result Date: 11/09/2020 IMPRESSION: Mild bibasilar atelectasis. Electronically Signed   By: Inez Catalina M.D.   On: 11/09/2020 13:11   US RENAL  Result Date: 11/10/2020 IMPRESSION: Unremarkable renal ultrasound. Electronically Signed   By: Primitivo Gauze M.D.   On: 11/10/2020 12:04    Cardiac Studies   2D echo 11/05/2020: 1. Left ventricular ejection fraction, by estimation, is 60 to 65%. The  left ventricle has normal function. The left ventricle has no regional  wall motion abnormalities. Left ventricular diastolic parameters are  consistent with Grade I diastolic  dysfunction (impaired relaxation).  2. Right ventricular systolic function is normal. The right ventricular  size is normal.  3. The mitral valve is normal in structure. No evidence of mitral valve  regurgitation.  4. The aortic valve is tricuspid. Aortic valve regurgitation is not  visualized. Mild aortic valve sclerosis is present, with no evidence of  aortic valve stenosis.  5. Echogenic structure noted in the posterior aorta (clips 2,3,7) this  could be artifactual vs supravalvular membrane.  Patient Profile     79 y.o. female with history of dementia, CKD stage III, HTN, HLD, iron deficiency anemia, and obesity who is being seen today for the evaluation of elevated troponin and bradycardia at the request of Dr. Reesa Chew.  Assessment & Plan    1. Elevated troponin: -Of uncertain significance -Patient denies any anginal symptoms -Echo this admission with preserved LV systolic function and no regional wall motion abnormality -Troponin is minimally elevated and flat trending, not consistent with ACS -No indication for heparin drip or emergent cardiac cath -Cannot exclude some degree of demand ischemia  in the setting of febrile illness with acute kidney injury, and  significant hypoglycemia -ASA -Crestor  2.  Sinus bradycardia/2-1 AV block/Wenckebach/brief third-degree AV block: -Cannot exclude some degree of vagal episodes with potential obstructive uropathy, continued abdominal pain, and fever of uncertain etiology, though would have expected her heart rate to improve with improvement in her overall medical illness -TSH normal this admission, potassium at goal -Currently hemodynamically stable with BP in the 1 teens to 517O systolic -No indication for atropine at this time -If she becomes unstable could transfer to the ICU and start dopamine infusion, though currently there are no available beds -Recommend nursing or physical therapy, at a minimum, reposition the patient to the edge of the bed to assess chronotropic response -We will reach out to EP in [place] for further recommendations -Avoid AV nodal blocking medications -No indication for emergent pacemaker at this time, especially in the setting of ongoing febrile illness of uncertain etiology -Consider retesting for Covid, as transient complete block has been noted with Covid, defer to IM  3.  Fever: -Consider reswabbing for Covid, management per IM  4.  AKI on CKD stage III: -Possibly in the setting of obstructive uropathy -Improving -Avoid nephrotoxic agents  5.  Lethargy/intermittent hypoglycemia/confusion: -Seems to be improving -Possibly multifactorial including episodes of hypoglycemia and hyponatremia -Given symptoms were present when patient was noted to have heart rates in the 50s to 60s bpm at baseline less likely primary cardiac in etiology -Management per internal medicine   For questions or updates, please contact [place] Please consult [email or web address] for contact info under Cardiology/STEMI.    Signed, Christell Faith, PA-C [place] Pager: [phone] 11/11/2020, 9:23 AM

## 2020-11-11 NOTE — Progress Notes (Signed)
Spoke with patient's daughter Aurilla Coulibaly who stated she had spoken with Dr. Reesa Chew and reviewed risks and benefits associated with monoclonal antibodies. Daughter wishes to proceed with infusion.

## 2020-11-12 DIAGNOSIS — E7800 Pure hypercholesterolemia, unspecified: Secondary | ICD-10-CM | POA: Diagnosis not present

## 2020-11-12 DIAGNOSIS — I459 Conduction disorder, unspecified: Secondary | ICD-10-CM | POA: Diagnosis not present

## 2020-11-12 LAB — GLUCOSE, CAPILLARY
Glucose-Capillary: 108 mg/dL — ABNORMAL HIGH (ref 70–99)
Glucose-Capillary: 125 mg/dL — ABNORMAL HIGH (ref 70–99)
Glucose-Capillary: 152 mg/dL — ABNORMAL HIGH (ref 70–99)
Glucose-Capillary: 161 mg/dL — ABNORMAL HIGH (ref 70–99)

## 2020-11-12 LAB — CBC
HCT: 34.3 % — ABNORMAL LOW (ref 36.0–46.0)
Hemoglobin: 11.2 g/dL — ABNORMAL LOW (ref 12.0–15.0)
MCH: 30.5 pg (ref 26.0–34.0)
MCHC: 32.7 g/dL (ref 30.0–36.0)
MCV: 93.5 fL (ref 80.0–100.0)
Platelets: 181 10*3/uL (ref 150–400)
RBC: 3.67 MIL/uL — ABNORMAL LOW (ref 3.87–5.11)
RDW: 13.3 % (ref 11.5–15.5)
WBC: 3.9 10*3/uL — ABNORMAL LOW (ref 4.0–10.5)
nRBC: 0 % (ref 0.0–0.2)

## 2020-11-12 LAB — BASIC METABOLIC PANEL
Anion gap: 7 (ref 5–15)
BUN: 34 mg/dL — ABNORMAL HIGH (ref 8–23)
CO2: 27 mmol/L (ref 22–32)
Calcium: 8.2 mg/dL — ABNORMAL LOW (ref 8.9–10.3)
Chloride: 105 mmol/L (ref 98–111)
Creatinine, Ser: 1.38 mg/dL — ABNORMAL HIGH (ref 0.44–1.00)
GFR, Estimated: 39 mL/min — ABNORMAL LOW (ref >60)
Glucose, Bld: 140 mg/dL — ABNORMAL HIGH (ref 70–99)
Potassium: 4.2 mmol/L (ref 3.5–5.1)
Sodium: 139 mmol/L (ref 135–145)

## 2020-11-12 LAB — PROCALCITONIN: Procalcitonin: 0.17 ng/mL

## 2020-11-12 MED ORDER — ENOXAPARIN SODIUM 40 MG/0.4ML ~~LOC~~ SOLN
40.0000 mg | Freq: Every day | SUBCUTANEOUS | Status: DC
  Administered 2020-11-12 – 2020-11-21 (×10): 40 mg via SUBCUTANEOUS
  Filled 2020-11-12 (×10): qty 0.4

## 2020-11-12 NOTE — Progress Notes (Signed)
PHARMACIST - PHYSICIAN COMMUNICATION  CONCERNING:  Enoxaparin (Lovenox) for DVT Prophylaxis    RECOMMENDATION: Patient was prescribed enoxaprin 30 mg q24 hours for VTE prophylaxis.   Filed Weights   11/04/20 1340  Weight: 78.7 kg (173 lb 9.6 oz)    Body mass index is 30.75 kg/m.  Estimated Creatinine Clearance: 32.3 mL/min (A) (by C-G formula based on SCr of 1.38 mg/dL (H)).   Patient is candidate for enoxaparin 40mg  every 24 hours based on CrCl >73ml/min or Weight >45kg  DESCRIPTION: Pharmacy has adjusted enoxaparin dose per [facility] policy.  Patient is now receiving enoxaparin 40 mg every 24 hours    Dorothe Pea, PharmD, BCPS Clinical Pharmacist  11/12/2020 8:07 AM

## 2020-11-12 NOTE — Plan of Care (Signed)
  Problem: Education: Goal: Knowledge of General Education information will improve Description: Including pain rating scale, medication(s)/side effects and non-pharmacologic comfort measures Outcome: Progressing   Problem: Health Behavior/Discharge Planning: Goal: Ability to manage health-related needs will improve Outcome: Progressing   Problem: Clinical Measurements: Goal: Ability to maintain clinical measurements within normal limits will improve Outcome: Progressing Goal: Will remain free from infection Outcome: Progressing Goal: Diagnostic test results will improve Outcome: Progressing Goal: Respiratory complications will improve Outcome: Progressing Goal: Cardiovascular complication will be avoided Outcome: Progressing   Problem: Activity: Goal: Risk for activity intolerance will decrease Outcome: Progressing   Problem: Nutrition: Goal: Adequate nutrition will be maintained Outcome: Progressing   Problem: Coping: Goal: Level of anxiety will decrease Outcome: Progressing   Problem: Elimination: Goal: Will not experience complications related to bowel motility Outcome: Progressing Goal: Will not experience complications related to urinary retention Outcome: Progressing   Problem: Pain Managment: Goal: General experience of comfort will improve Outcome: Progressing   Problem: Safety: Goal: Ability to remain free from injury will improve Outcome: Progressing   Problem: Skin Integrity: Goal: Risk for impaired skin integrity will decrease Outcome: Progressing   Problem: Education: Goal: Knowledge of secondary prevention will improve Outcome: Progressing Goal: Knowledge of patient specific risk factors addressed and post discharge goals established will improve Outcome: Progressing Goal: Individualized Educational Video(s) Outcome: Progressing   Problem: Self-Care: Goal: Verbalization of feelings and concerns over difficulty with self-care will  improve Outcome: Progressing Goal: Ability to communicate needs accurately will improve Outcome: Progressing   Problem: Nutrition: Goal: Dietary intake will improve Outcome: Progressing

## 2020-11-12 NOTE — Progress Notes (Addendum)
Speech Language Pathology Treatment: Dysphagia  Patient Details Name: Alexis Rodriguez MRN: [id number] DOB: [birth year] Today's Date: 11/12/2020 Time: 9798-9211 SLP Time Calculation (min) (ACUTE ONLY): 50 min  Assessment / Plan / Recommendation Clinical Impression  Pt seen for ongoing assessment of swallowing. She appears to continue to improve some each day though she is significant deconditioned in the bed; Volume of speech low w/ reduced effort. She stated she was interested in having trials of "water" and verbally responsive and able to follow instructions w/ min-mod cues. Pt is on 1L; wbc low w/ temp spike last shift per chart. Noted pt's HR was in the 40s during the session. Pt explained general aspiration precautions and agreed verbally to the need for following them especially sitting upright for all oral intake. Pt fully assisted w/ positioning d/t weakness but was able to hold bedrails. She was then given Single trials of ice chips(7), thin liquids by tsp, cup(3 each), nectar liquids, purees, and broken soft solids. No immediate, overt clinical s/s of aspiration were noted w/ any consistency; respiratory status appeared to remain calm and unlabored, vocal quality clear b/t trials when assessed. Pt helped to hold Cup when drinking following instructions for single, small sips slowly. NO straws were utiilized for better oral control. However, pt exhibited a delayed cough w/ trials of thin liquids; also noted involuntary mandible tremors intermittently during trials. Oral phase appeared grossly [facility] for bolus management and timely A-P transfer for swallowing; oral clearing achieved w/ all consistencies. Oral tremors noted as well when attempting to take small sips of thin liquids via Cup - Spillage occurred intermittently. Pt appeared to exhibit adequate mastication effort and oral management of broken soft solids(minced) w/ full oral clearing given min time and alternating w/ tsps of puree for good  measure. Due to pt's Significant Weakness and Deconditioning overall, she is at increased risk for aspiration moreso w/ thin liquids. Recommend upgrade to Dysphagia level 2 diet (minced foods, purees) w/ gravies added to moisten foods; Nectar liquids continued during meals. Recommend aspiration precautions; Pills Crushed in Puree; tray setup and positioning assistance for meals and support w/ feeding d/t weakness. Recommend Single ice chips in between meals for Pleasure post thorough oral care w/ NSG Supervision. ST services will continue to f/u w/ pt for toleration of diet and education as needed while admitted; trials to upgrade when appropriate. NSG updated. Precautions posted at bedside.     HPI HPI: Pt is a 79 y.o. female with medical history significant of hypertension, hyperlipidemia, dementia, iron deficiency anemia, CKD-3, who presents with strokelike symptoms. Per her daughter, patient has strokelike symptoms for about 1 month. Symptoms have been going on intermittently, including confusion, blurry vision, slurred speech, slow response with staring off. No episode of fully loss of consciousness. No facial droop. Patient moves all extremities. Patient does not have chest pain, cough, shortness breath, fever or chills. No nausea, vomiting, diarrhea. MRI: "1. No acute intracranial abnormality. 2. Moderately advanced temporal lobe predominant cerebral atrophy  with chronic small vessel ischemic disease. MR of Abd.: included small to moderate-sized type 1 hiatal hernia.      SLP Plan  Continue with current plan of care       Recommendations  Diet recommendations: Dysphagia 2 (fine chop);Nectar-thick liquid (ice chips for pleasure in between meals) Liquids provided via: Cup;Teaspoon Medication Administration: Crushed with puree (for ease and safety of swallowing) Supervision: Staff to assist with self feeding;Full supervision/cueing for compensatory strategies Compensations: Minimize  environmental distractions;Slow rate;Small sips/bites;Lingual  sweep for clearance of pocketing;Multiple dry swallows after each bite/sip;Follow solids with liquid Postural Changes and/or Swallow Maneuvers: Seated upright 90 degrees;Upright 30-60 min after meal                General recommendations:  (Dietician f/u) Oral Care Recommendations: Oral care BID;Oral care before and after PO;Staff/trained caregiver to provide oral care Follow up Recommendations: Skilled Nursing facility (TBD) SLP Visit Diagnosis: Dysphagia, oropharyngeal phase (R13.12) Plan: Continue with current plan of care       GO                 [name], [place], [place] Pathologist Rehab Services [phone] [facility] 11/12/2020, 5:15 PM

## 2020-11-12 NOTE — Progress Notes (Signed)
Progress Note  Due to the COVID-19 pandemic, this visit was completed with telemedicine (audio/video) technology to reduce patient and provider exposure as well as to preserve personal protective equipment.   Patient Name: Alexis Rodriguez Date of Encounter: 11/12/2020  Primary Cardiologist: New-Dr. Saunders Revel  Subjective   No acute events overnight, currently afebrile, on antibiotics.  Inpatient Medications    Scheduled Meds: . aspirin EC  81 mg Oral Daily  . Chlorhexidine Gluconate Cloth  6 each Topical Daily  . enoxaparin (LOVENOX) injection  40 mg Subcutaneous Q2200  . ferrous sulfate  325 mg Oral Daily  . magnesium gluconate  250 mg Oral Daily  . melatonin  5 mg Oral QHS  . memantine  28 mg Oral QHS  . metroNIDAZOLE  500 mg Oral Q8H  . OLANZapine  5 mg Oral QHS  . rosuvastatin  10 mg Oral Daily   Continuous Infusions: . sodium chloride    . cefTRIAXone (ROCEPHIN)  IV 2 g (11/12/20 0847)  . dextrose 5 % and 0.9% NaCl 50 mL/hr at 11/11/20 2240  . famotidine (PEPCID) IV    . lactated ringers     PRN Meds: sodium chloride, acetaminophen **OR** acetaminophen (TYLENOL) oral liquid 160 mg/5 mL **OR** acetaminophen, albuterol, dextrose, diphenhydrAMINE, EPINEPHrine, famotidine (PEPCID) IV, hydrALAZINE, methylPREDNISolone (SOLU-MEDROL) injection, ondansetron (ZOFRAN) IV   Vital Signs    Vitals:   11/11/20 2335 11/12/20 0600 11/12/20 0839 11/12/20 1156  BP: (!) 144/75 140/63 (!) 148/42 (!) 134/42  Pulse: (!) 49 69 (!) 40 (!) 40  Resp: 17  18 18   Temp: 98.8 F (37.1 C) 98.5 F (36.9 C) 98.5 F (36.9 C) 98 F (36.7 C)  TempSrc: Oral  Oral Oral  SpO2: 97% 100% 100% 100%  Weight:      Height:        Intake/Output Summary (Last 24 hours) at 11/12/2020 1232 Last data filed at 11/12/2020 0700 Gross per 24 hour  Intake 837.95 ml  Output 1150 ml  Net -312.05 ml   Last 3 Weights 11/04/2020  Weight (lbs) 173 lb 9.6 oz  Weight (kg) 78.744 kg      Telemetry    Sinus rhythm,  intermediate 2-1 AV block.- Personally Reviewed  ECG    No new tracing- Personally Reviewed  Physical Exam   VITAL SIGNS:  reviewed Physical exam not performed  Labs    Chemistry Recent Labs  Lab 11/10/20 0217 11/10/20 0500 11/11/20 0548 11/11/20 1700 11/12/20 0501  NA 129*   < > 133* 137 139  K 4.4   < > 4.4 4.4 4.2  CL 93*   < > 102 104 105  CO2 24   < > 26 24 27   GLUCOSE 108*   < > 181* 192* 140*  BUN 52*   < > 41* 36* 34*  CREATININE 2.59*   < > 1.63* 1.46* 1.38*  CALCIUM 8.9   < > 8.0* 8.0* 8.2*  PROT 6.9  --   --   --   --   ALBUMIN 3.4*  --   --   --   --   AST 161*  --   --   --   --   ALT 54*  --   --   --   --   ALKPHOS 30*  --   --   --   --   BILITOT 0.6  --   --   --   --   GFRNONAA 18*   < >  32* 36* 39*  ANIONGAP 12   < > 5 9 7    < > = values in this interval not displayed.     Hematology Recent Labs  Lab 11/07/20 0608 11/10/20 0217 11/12/20 0501  WBC 8.9 5.7 3.9*  RBC 4.33 4.36 3.67*  HGB 13.2 13.0 11.2*  HCT 38.8 39.2 34.3*  MCV 89.6 89.9 93.5  MCH 30.5 29.8 30.5  MCHC 34.0 33.2 32.7  RDW 12.4 13.2 13.3  PLT 271 241 181    Cardiac EnzymesNo results for input(s): TROPONINI in the last 168 hours. No results for input(s): TROPIPOC in the last 168 hours.   BNPNo results for input(s): BNP, PROBNP in the last 168 hours.   DDimer No results for input(s): DDIMER in the last 168 hours.   Radiology    No results found.  Cardiac Studies   Echo 10/2020 1. Left ventricular ejection fraction, by estimation, is 60 to 65%. The  left ventricle has normal function. The left ventricle has no regional  wall motion abnormalities. Left ventricular diastolic parameters are  consistent with Grade I diastolic  dysfunction (impaired relaxation).  2. Right ventricular systolic function is normal. The right ventricular  size is normal.  3. The mitral valve is normal in structure. No evidence of mitral valve  regurgitation.  4. The aortic valve is  tricuspid. Aortic valve regurgitation is not  visualized. Mild aortic valve sclerosis is present, with no evidence of  aortic valve stenosis.  5. Echogenic structure noted in the posterior aorta (clips 2,3,7) this  could be artifactual vs supravalvular membrane.   Patient Profile     79 y.o. female history of dementia, hypertension, hyperlipidemia, CKD 3 presenting with altered mental status, diagnosed with COVID-19, being seen for AV conduction abnormalities.   Assessment & Plan    1. High degree AV block -Currently in sinus rhythm with intermittent 2-1 block. -Management of febrile illness, COVID-19 as per primary team -PPM may be considered after acute illness/fevers if conduction abnormalities persist  2. Hyperlipidemia -Crestor  3. COVID-19, fevers -Management as per primary team     Signed, Kate Sable, MD  11/12/2020, 12:32 PM

## 2020-11-12 NOTE — Progress Notes (Signed)
   11/12/20 1655  Assess: MEWS Score  Temp (!) 102.6 F (39.2 C)  BP (!) 159/42  Pulse Rate (!) 44  Resp 16  SpO2 97 %  Assess: MEWS Score  MEWS Temp 2  MEWS Systolic 0  MEWS Pulse 1  MEWS RR 0  MEWS LOC 0  MEWS Score 3  MEWS Score Color Yellow  Assess: if the MEWS score is Yellow or Red  Were vital signs taken at a resting state? Yes  Focused Assessment Change from prior assessment (see assessment flowsheet)  Early Detection of Sepsis Score *See Row Information* Low  MEWS guidelines implemented *See Row Information* Yes  Treat  MEWS Interventions Administered prn meds/treatments;Other (Comment) (PRN tylenol given)  Take Vital Signs  Increase Vital Sign Frequency  Yellow: Q 2hr X 2 then Q 4hr X 2, if remains yellow, continue Q 4hrs  Escalate  MEWS: Escalate Yellow: discuss with charge nurse/RN and consider discussing with provider and RRT  Notify: Charge Nurse/RN  Name of Charge Nurse/RN Notified [place]  Date Charge Nurse/RN Notified 11/12/20  Time Charge Nurse/RN Notified 1700  Document  Patient Outcome Other (Comment) (continue to monitor)  Progress note created (see row info) Yes

## 2020-11-12 NOTE — Progress Notes (Signed)
PROGRESS NOTE    Alexis Rodriguez  PPJ:[id number] DOB: 1941/05/06 DOA: 11/04/2020 PCP: Perrin Maltese, MD   Brief Narrative: Taken from H&P. Alexis Rodriguez is a 79 y.o. female with medical history significant of hypertension, hyperlipidemia, dementia, iron deficiency anemia, CKD-3, who presents with strokelike symptoms.  Per her daughter, patient has strokelike symptoms for about 1 month. Symptoms have been going on intermittently, including confusion, blurry vision, slurred speech, slow response with staring off. No episode of fully loss of consciousness. No facial droop. Patient moves all extremities. Patient does not have chest pain, cough, shortness breath, fever or chills. No nausea, vomiting, diarrhea, abdominal pain, symptoms of UTI.   She was also found to have hypoglycemia without any prior diagnosis of diabetes or being on any antidiabetic medications. CT head and MRI without any acute infarct.  Continue to drop blood glucose level.  Requiring D10. Neurology was consulted-recommending stroke work-up and EEG, which was negative. Persistent hyperinsulinism hypoglycemia, rest of the labs pending. Responded well to glucagon and octreotide in the beginning, later maintaining blood glucose on diazoxide, which was discontinued due to urinary retention and she was restarted on octreotide. Patient developed bradycardia and become febrile on 11/10/2020, cardiology was consulted and they recommend discontinuing octreotide stating that it can cause conduction defects although patient just received 1 dose and bradycardia was present before getting that octreotide. Also developed hyponatremia, responding well to volume repletion. Blood glucose level currently maintained on normal saline with D5.  Cardiology repeated Covid testing today and it came back positive.  Negative on admission.  Monoclonal antibody ordered.  Subjective: Patient is awake, denies any complaints, no shortness of breath or denies any  chest pain  Assessment & Plan:   Principal Problem:   Stroke-like symptoms Active Problems:   HTN (hypertension)   Hypoglycemia   Dementia (HCC)   HLD (hyperlipidemia)   Iron deficiency anemia   CKD (chronic kidney disease), stage IIIa   Depression   Pressure injury of skin   Third degree AV block (HCC)   AKI (acute kidney injury) ([place])   Heart block  Stroke right symptoms/persistent intermittent hypoglycemia.  Imaging is negative for any acute infarct.  Echocardiogram with normal EF and grade 1 diastolic dysfunction.  A1c of 4.7.  EEG without any epileptiform changes. Very vague symptoms might be due to intermittent hypoglycemia. Intermittent symptoms for 1 month, where she intermittently seems more confused, staring and sometimes sweating.  Per daughter she is having good p.o. intake and ready consistent with her routine. Patient has underlying dementia and is oriented to self only at baseline. Patient responded well to glucagon challenge and later blood sugar increased above 200 with octreotide subcu. had curbside discussion with endocrinology and our pharmacist and decided to start her on diazoxide, developed urinary retention requiring Foley catheter, Diazoxide was discontinued and plan was to restart octreotide, received 1 dose of octreotide and there was some concern of conduction abnormalities with 1:2 block, cardiology advised to discontinue octreotide.  Currently on normal saline with D5 and maintaining blood glucose level. Insulin and C-peptide elevated. Sulfonylurea , pro insuline, insulin antibodies levels still pending. A.m. cortisol mildly elevated. TSH within normal limit Pancreatic protocol MRI without any significant abnormality. 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 -Continue CBG monitoring. -Talked with Dr. Gabriel Carina from endocrinology.  She will call back with her suggestions without diazoxide or octreotide after discussing with her  colleague -Plan is to hold dextrose infusion and repeat labs including C-peptide, insulin levels,  IGF 2, beta hydroxybutyrate once patient's blood sugars drop below 70 with symptoms are below 50 without symptoms -Once labs are drawn, she can be restarted on dextrose infusion  Fever.  Patient has been having recurrent fevers. CBC with no leukocytosis, no lactic acidosis, procalcitonin is not markedly elevated, blood cultures remain negative.  Urine culture showed no growth. CMP with AKI and transaminitis along with hyponatremia. Remained febrile over the past 24-hour.  Antibiotics were added for concern of aspiration although chest x-ray without any acute changes. -Continue Rocephin and Flagyl-allergic to amoxicillin. -Continue to follow fever curve.  COVID-19 positive.  Cardiology reswabbed patient today and it came back positive for COVID-19. -Put her on precautions. -She received a dose of monoclonal antibody on 12/28 -Continue supportive measures  Bradycardia/conduction abnormalities.  Patient's baseline heart rate was in 50s, decreased to 40s and telemetry called with concern of heart block. Cardiology was consulted and they really think that octreotide is playing a role advising discontinuation of octreotide. Patient just received 1 dose of octreotide yesterday around noon. We will monitor patient without octreotide.  Hyponatremia.  New onset hyponatremia.  Sodium nadir at 126, hyponatremia labs consistent with hypovolemic hyponatremia, responding appropriately with normal saline.  Zoloft was discontinued. -Continue to monitor  AKI and urinary retention/CKD stage IIIa.  Patient developed urinary retention and AKI. Overnight in and out with more than 600 cc.  No urinary output this morning so bladder scan done and it shows more than 1 L.  Foley catheter was placed. Creatinine started improving.  Renal ultrasound within normal limit. -Monitor kidney functions. -Avoid  nephrotoxins -Performing voiding trial, if she continues to have urinary retention may need to replace Foley  Cough.  Per nursing concern that she developed new cough over the past 24-hour.  Remained afebrile.  Concern of aspiration. -Chest x-ray-bilateral atelectasis, no obvious infiltrate. -Speech and swallow evaluation-concern of aspiration with thin liquids, diet changed to nectar thick.  Physical deconditioning.  PT recommending SNF.  Family is agreeable. -TOC started working on it.  Pyuria.  No urinary symptoms.  Urine cultures are pending.Per daughter received recent 3 day course of cipro.by PCP for aspiration UTI. - urine cultures-multiple species. -Repeat urine culture is negative.  Hypertension.  She was not on any antihypertensives at home.  Blood pressure within goal today. -Continue to monitor. -Hydralazine as needed for systolic above 426.  Depression.  No acute concern. -Continue home dose of  Olanzapine. -Discontinue Zoloft as she developed hyponatremia.  Dementia ([place]) -Continue Namzaric  HLD (hyperlipidemia) -Continue Tricor  Iron deficiency anemia: Hemoglobin 13.7 -Continue iron supplement  Objective: Vitals:   11/12/20 1156 11/12/20 1655 11/12/20 1818 11/12/20 1956  BP: (!) 134/42 (!) 159/42 (!) 150/56 (!) 123/43  Pulse: (!) 40 (!) 44  (!) 44  Resp: 18 16 18 20   Temp: 98 F (36.7 C) (!) 102.6 F (39.2 C) 100.2 F (37.9 C) 98.5 F (36.9 C)  TempSrc: Oral  Oral Oral  SpO2: 100% 97% 97% 97%  Weight:      Height:        Intake/Output Summary (Last 24 hours) at 11/12/2020 2001 Last data filed at 11/12/2020 1943 Gross per 24 hour  Intake 1423.27 ml  Output 550 ml  Net 873.27 ml   Filed Weights   11/04/20 1340  Weight: 78.7 kg    Examination:  General exam: Alert, awake, oriented x 3 Respiratory system: Clear to auscultation. Respiratory effort normal. Cardiovascular system:RRR. No murmurs, rubs, gallops. Gastrointestinal system:  Abdomen  is nondistended, soft and nontender. No organomegaly or masses felt. Normal bowel sounds heard. Central nervous system: Alert and oriented. No focal neurological deficits. Extremities: No C/C/E, +pedal pulses Skin: No rashes, lesions or ulcers Psychiatry: Judgement and insight appear normal. Mood & affect appropriate.    DVT prophylaxis: Lovenox Code Status: Full Family Communication: Discussed with daughter on phone. Disposition Plan:  Status is: Inpatient  Remains inpatient appropriate because:Inpatient level of care appropriate due to severity of illness   Dispo: The patient is from: Home              Anticipated d/c is to: SNF              Anticipated d/c date is: 1-2 days.              Patient currently is not medically stable.  TOC is looking for placement. Patient developed new onset fever, urinary retention and conduction abnormalities.  Consultants:   Neurology  Curbside endocrinology  Cardiology  Procedures:  Antimicrobials:  Rocephin Flagyl  Data Reviewed: I have personally reviewed following labs and imaging studies  CBC: Recent Labs  Lab 11/07/20 0608 11/10/20 0217 11/12/20 0501  WBC 8.9 5.7 3.9*  NEUTROABS  --  3.7  --   HGB 13.2 13.0 11.2*  HCT 38.8 39.2 34.3*  MCV 89.6 89.9 93.5  PLT 271 241 517   Basic Metabolic Panel: Recent Labs  Lab 11/10/20 1228 11/10/20 1642 11/11/20 0548 11/11/20 1700 11/12/20 0501  NA 129* 128* 133* 137 139  K 4.4 4.5 4.4 4.4 4.2  CL 96* 94* 102 104 105  CO2 23 22 26 24 27   GLUCOSE 160* 199* 181* 192* 140*  BUN 48* 50* 41* 36* 34*  CREATININE 1.96* 2.03* 1.63* 1.46* 1.38*  CALCIUM 8.3* 8.2* 8.0* 8.0* 8.2*  MG  --  1.9  --   --   --    GFR: Estimated Creatinine Clearance: 32.3 mL/min (A) (by C-G formula based on SCr of 1.38 mg/dL (H)). Liver Function Tests: Recent Labs  Lab 11/10/20 0217  AST 161*  ALT 54*  ALKPHOS 30*  BILITOT 0.6  PROT 6.9  ALBUMIN 3.4*   No results for input(s): LIPASE,  AMYLASE in the last 168 hours. No results for input(s): AMMONIA in the last 168 hours. Coagulation Profile: Recent Labs  Lab 11/10/20 0217  INR 1.3*   Cardiac Enzymes: No results for input(s): CKTOTAL, CKMB, CKMBINDEX, TROPONINI in the last 168 hours. BNP (last 3 results) No results for input(s): PROBNP in the last 8760 hours. HbA1C: No results for input(s): HGBA1C in the last 72 hours. CBG: Recent Labs  Lab 11/11/20 2002 11/11/20 2349 11/12/20 0845 11/12/20 1154 11/12/20 1651  GLUCAP 182* 157* 108* 125* 152*   Lipid Profile: No results for input(s): CHOL, HDL, LDLCALC, TRIG, CHOLHDL, LDLDIRECT in the last 72 hours. Thyroid Function Tests: Recent Labs    11/10/20 0500  TSH 2.774   Anemia Panel: No results for input(s): VITAMINB12, FOLATE, FERRITIN, TIBC, IRON, RETICCTPCT in the last 72 hours. Sepsis Labs: Recent Labs  Lab 11/10/20 0217 11/10/20 0500 11/11/20 0548 11/12/20 0501  PROCALCITON 0.48  --  0.23 0.17  LATICACIDVEN 1.6 1.5  --   --     Recent Results (from the past 240 hour(s))  Resp Panel by RT-PCR (Flu A&B, Covid) Nasopharyngeal Swab     Status: None   Collection Time: 11/04/20  3:14 PM   Specimen: Nasopharyngeal Swab; Nasopharyngeal(NP) swabs in vial transport medium  Result Value Ref Range Status   SARS Coronavirus 2 by RT PCR NEGATIVE NEGATIVE Final    Comment: (NOTE) SARS-CoV-2 target nucleic acids are NOT DETECTED.  The SARS-CoV-2 RNA is generally detectable in upper respiratory specimens during the acute phase of infection. The lowest concentration of SARS-CoV-2 viral copies this assay can detect is 138 copies/mL. A negative result does not preclude SARS-Cov-2 infection and should not be used as the sole basis for treatment or other patient management decisions. A negative result may occur with  improper specimen collection/handling, submission of specimen other than nasopharyngeal swab, presence of viral mutation(s) within the areas  targeted by this assay, and inadequate number of viral copies(<138 copies/mL). A negative result must be combined with clinical observations, patient history, and epidemiological information. The expected result is Negative.  Fact Sheet for Patients:  EntrepreneurPulse.com.au  Fact Sheet for Healthcare Providers:  IncredibleEmployment.be  This test is no t yet approved or cleared by the [place] FDA and  has been authorized for detection and/or diagnosis of SARS-CoV-2 by FDA under an Emergency Use Authorization (EUA). This EUA will remain  in effect (meaning this test can be used) for the duration of the COVID-19 declaration under Section 564(b)(1) of the Act, 21 U.S.C.section 360bbb-3(b)(1), unless the authorization is terminated  or revoked sooner.       Influenza A by PCR NEGATIVE NEGATIVE Final   Influenza B by PCR NEGATIVE NEGATIVE Final    Comment: (NOTE) The Xpert Xpress SARS-CoV-2/FLU/RSV plus assay is intended as an aid in the diagnosis of influenza from Nasopharyngeal swab specimens and should not be used as a sole basis for treatment. Nasal washings and aspirates are unacceptable for Xpert Xpress SARS-CoV-2/FLU/RSV testing.  Fact Sheet for Patients: EntrepreneurPulse.com.au  Fact Sheet for Healthcare Providers: IncredibleEmployment.be  This test is not yet approved or cleared by the [place] FDA and has been authorized for detection and/or diagnosis of SARS-CoV-2 by FDA under an Emergency Use Authorization (EUA). This EUA will remain in effect (meaning this test can be used) for the duration of the COVID-19 declaration under Section 564(b)(1) of the Act, 21 U.S.C. section 360bbb-3(b)(1), unless the authorization is terminated or revoked.  Performed at [facility], [address], [place], [place] [address]   Urine Culture     Status: Abnormal   Collection Time:  11/04/20  3:42 PM   Specimen: Urine, Random  Result Value Ref Range Status   Specimen Description   Final    URINE, RANDOM Performed at [facility], [address] [place], [place] [address]    Special Requests   Final    NONE Performed at [facility], [address] [place], [place] [address]    Culture MULTIPLE SPECIES PRESENT, SUGGEST RECOLLECTION (A)  Final   Report Status 11/06/2020 FINAL  Final  Culture, blood (x 2)     Status: None (Preliminary result)   Collection Time: 11/10/20  2:17 AM   Specimen: BLOOD  Result Value Ref Range Status   Specimen Description BLOOD BLOOD RIGHT HAND  Final   Special Requests   Final    BOTTLES DRAWN AEROBIC AND ANAEROBIC Blood Culture results may not be optimal due to an inadequate volume of blood received in culture bottles   Culture   Final    NO GROWTH 2 DAYS Performed at [facility], [address] [place], [place] [address]    Report Status PENDING  Incomplete  Culture, blood (x 2)  Status: None (Preliminary result)   Collection Time: 11/10/20  2:18 AM   Specimen: BLOOD  Result Value Ref Range Status   Specimen Description BLOOD BLOOD LEFT HAND  Final   Special Requests   Final    BOTTLES DRAWN AEROBIC AND ANAEROBIC Blood Culture adequate volume   Culture   Final    NO GROWTH 2 DAYS Performed at [facility], [address] [place], [place] [address]    Report Status PENDING  Incomplete  Culture, Urine     Status: None   Collection Time: 11/10/20  4:49 AM   Specimen: Urine, Random  Result Value Ref Range Status   Specimen Description   Final    URINE, RANDOM Performed at [facility], [address] [place], [place] [address]    Special Requests   Final    NONE Performed at [facility], [address], [place], [place] [address]    Culture   Final    NO GROWTH Performed at [facility], [place] [address], [place], [place] [address]    Report  Status 11/11/2020 FINAL  Final  Resp Panel by RT-PCR (Flu A&B, Covid) Nasopharyngeal Swab     Status: Abnormal   Collection Time: 11/11/20 12:30 PM   Specimen: Nasopharyngeal Swab; Nasopharyngeal(NP) swabs in vial transport medium  Result Value Ref Range Status   SARS Coronavirus 2 by RT PCR POSITIVE (A) NEGATIVE Final    Comment: RESULT CALLED TO, READ BACK BY AND VERIFIED WITH:  TIFFANY ROBINSON 1336 11/11/20 SDR (NOTE) SARS-CoV-2 target nucleic acids are DETECTED.  The SARS-CoV-2 RNA is generally detectable in upper respiratory specimens during the acute phase of infection. Positive results are indicative of the presence of the identified virus, but do not rule out bacterial infection or co-infection with other pathogens not detected by the test. Clinical correlation with patient history and other diagnostic information is necessary to determine patient infection status. The expected result is Negative.  Fact Sheet for Patients: EntrepreneurPulse.com.au  Fact Sheet for Healthcare Providers: IncredibleEmployment.be  This test is not yet approved or cleared by the [place] FDA and  has been authorized for detection and/or diagnosis of SARS-CoV-2 by FDA under an Emergency Use Authorization (EUA).  This EUA will remain in effect (meaning this test can  be used) for the duration of  the COVID-19 declaration under Section 564(b)(1) of the Act, 21 U.S.C. section 360bbb-3(b)(1), unless the authorization is terminated or revoked sooner.     Influenza A by PCR NEGATIVE NEGATIVE Final   Influenza B by PCR NEGATIVE NEGATIVE Final    Comment: (NOTE) The Xpert Xpress SARS-CoV-2/FLU/RSV plus assay is intended as an aid in the diagnosis of influenza from Nasopharyngeal swab specimens and should not be used as a sole basis for treatment. Nasal washings and aspirates are unacceptable for Xpert Xpress SARS-CoV-2/FLU/RSV testing.  Fact Sheet for  Patients: EntrepreneurPulse.com.au  Fact Sheet for Healthcare Providers: IncredibleEmployment.be  This test is not yet approved or cleared by the [place] FDA and has been authorized for detection and/or diagnosis of SARS-CoV-2 by FDA under an Emergency Use Authorization (EUA). This EUA will remain in effect (meaning this test can be used) for the duration of the COVID-19 declaration under Section 564(b)(1) of the Act, 21 U.S.C. section 360bbb-3(b)(1), unless the authorization is terminated or revoked.  Performed at [facility], [address] [place], [place] [address]   Resp panel by RT-PCR (RSV, Flu A&B, Covid) Nasopharyngeal Swab  Status: Abnormal   Collection Time: 11/11/20  5:17 PM   Specimen: Nasopharyngeal Swab; Nasopharyngeal(NP) swabs in vial transport medium  Result Value Ref Range Status   SARS Coronavirus 2 by RT PCR POSITIVE (A) NEGATIVE Final    Comment: RESULT CALLED TO, READ BACK BY AND VERIFIED WITH: STEPHANIE ROBINSON 11/11/20 1838 KLW (NOTE) SARS-CoV-2 target nucleic acids are DETECTED.  The SARS-CoV-2 RNA is generally detectable in upper respiratory specimens during the acute phase of infection. Positive results are indicative of the presence of the identified virus, but do not rule out bacterial infection or co-infection with other pathogens not detected by the test. Clinical correlation with patient history and other diagnostic information is necessary to determine patient infection status. The expected result is Negative.  Fact Sheet for Patients: EntrepreneurPulse.com.au  Fact Sheet for Healthcare Providers: IncredibleEmployment.be  This test is not yet approved or cleared by the [place] FDA and  has been authorized for detection and/or diagnosis of SARS-CoV-2 by FDA under an Emergency Use Authorization (EUA).  This EUA will remain in effect (meaning  this test can  be used) for the duration of  the COVID-19 declaration under Section 564(b)(1) of the Act, 21 U.S.C. section 360bbb-3(b)(1), unless the authorization is terminated or revoked sooner.     Influenza A by PCR NEGATIVE NEGATIVE Final   Influenza B by PCR NEGATIVE NEGATIVE Final    Comment: (NOTE) The Xpert Xpress SARS-CoV-2/FLU/RSV plus assay is intended as an aid in the diagnosis of influenza from Nasopharyngeal swab specimens and should not be used as a sole basis for treatment. Nasal washings and aspirates are unacceptable for Xpert Xpress SARS-CoV-2/FLU/RSV testing.  Fact Sheet for Patients: EntrepreneurPulse.com.au  Fact Sheet for Healthcare Providers: IncredibleEmployment.be  This test is not yet approved or cleared by the [place] FDA and has been authorized for detection and/or diagnosis of SARS-CoV-2 by FDA under an Emergency Use Authorization (EUA). This EUA will remain in effect (meaning this test can be used) for the duration of the COVID-19 declaration under Section 564(b)(1) of the Act, 21 U.S.C. section 360bbb-3(b)(1), unless the authorization is terminated or revoked.     Resp Syncytial Virus by PCR NEGATIVE NEGATIVE Final    Comment: (NOTE) Fact Sheet for Patients: EntrepreneurPulse.com.au  Fact Sheet for Healthcare Providers: IncredibleEmployment.be  This test is not yet approved or cleared by the [place] FDA and has been authorized for detection and/or diagnosis of SARS-CoV-2 by FDA under an Emergency Use Authorization (EUA). This EUA will remain in effect (meaning this test can be used) for the duration of the COVID-19 declaration under Section 564(b)(1) of the Act, 21 U.S.C. section 360bbb-3(b)(1), unless the authorization is terminated or revoked.  Performed at [facility], [address], [place], [place] [address]      Radiology Studies: No  results found.  Scheduled Meds: . aspirin EC  81 mg Oral Daily  . Chlorhexidine Gluconate Cloth  6 each Topical Daily  . enoxaparin (LOVENOX) injection  40 mg Subcutaneous Q2200  . ferrous sulfate  325 mg Oral Daily  . magnesium gluconate  250 mg Oral Daily  . melatonin  5 mg Oral QHS  . memantine  28 mg Oral QHS  . metroNIDAZOLE  500 mg Oral Q8H  . OLANZapine  5 mg Oral QHS  . rosuvastatin  10 mg Oral Daily   Continuous Infusions: . sodium chloride    . cefTRIAXone (ROCEPHIN)  IV 2 g (11/12/20 0847)  . dextrose 5 % and  0.9% NaCl 50 mL/hr at 11/11/20 2240  . famotidine (PEPCID) IV    . lactated ringers       LOS: 7 days   Time spent: 40 minutes.  Kathie Dike, MD [facility]  If 7PM-7AM, please contact night-coverage [email or web address]  11/12/2020, 8:01 PM

## 2020-11-12 NOTE — Progress Notes (Signed)
At 2124, Pt is to receive first dose of etesimab. Oral temp is 100.4. NP on call informed. MEWS is yellow. Per NP, it is okay to administer etesimab. Emergency medications at the bedside. New IV started on Right hand with good blood return. Infusion started at 2232, no reactions noted. VS monitored during and after infusion.

## 2020-11-12 NOTE — Progress Notes (Addendum)
Pt has a large bowel movement. Incontinent care provided. DTI on rt posterior thigh noted. Cleansed, mepilex applied.

## 2020-11-12 NOTE — Progress Notes (Signed)
Foley catheter removed per MD order. Pt tolerated well.Purewick in place.

## 2020-11-13 DIAGNOSIS — N179 Acute kidney failure, unspecified: Secondary | ICD-10-CM

## 2020-11-13 DIAGNOSIS — R2990 Unspecified symptoms and signs involving the nervous system: Secondary | ICD-10-CM | POA: Diagnosis not present

## 2020-11-13 DIAGNOSIS — N1831 Chronic kidney disease, stage 3a: Secondary | ICD-10-CM | POA: Diagnosis not present

## 2020-11-13 DIAGNOSIS — F0390 Unspecified dementia without behavioral disturbance: Secondary | ICD-10-CM | POA: Diagnosis not present

## 2020-11-13 LAB — COMPREHENSIVE METABOLIC PANEL
ALT: 28 U/L (ref 0–44)
AST: 60 U/L — ABNORMAL HIGH (ref 15–41)
Albumin: 2.4 g/dL — ABNORMAL LOW (ref 3.5–5.0)
Alkaline Phosphatase: 20 U/L — ABNORMAL LOW (ref 38–126)
Anion gap: 7 (ref 5–15)
BUN: 31 mg/dL — ABNORMAL HIGH (ref 8–23)
CO2: 26 mmol/L (ref 22–32)
Calcium: 8 mg/dL — ABNORMAL LOW (ref 8.9–10.3)
Chloride: 110 mmol/L (ref 98–111)
Creatinine, Ser: 1.14 mg/dL — ABNORMAL HIGH (ref 0.44–1.00)
GFR, Estimated: 49 mL/min — ABNORMAL LOW (ref >60)
Glucose, Bld: 111 mg/dL — ABNORMAL HIGH (ref 70–99)
Potassium: 3.9 mmol/L (ref 3.5–5.1)
Sodium: 143 mmol/L (ref 135–145)
Total Bilirubin: 0.5 mg/dL (ref 0.3–1.2)
Total Protein: 5.6 g/dL — ABNORMAL LOW (ref 6.5–8.1)

## 2020-11-13 LAB — CBC
HCT: 34 % — ABNORMAL LOW (ref 36.0–46.0)
Hemoglobin: 11 g/dL — ABNORMAL LOW (ref 12.0–15.0)
MCH: 30.1 pg (ref 26.0–34.0)
MCHC: 32.4 g/dL (ref 30.0–36.0)
MCV: 93.2 fL (ref 80.0–100.0)
Platelets: 164 10*3/uL (ref 150–400)
RBC: 3.65 MIL/uL — ABNORMAL LOW (ref 3.87–5.11)
RDW: 13.5 % (ref 11.5–15.5)
WBC: 3.3 10*3/uL — ABNORMAL LOW (ref 4.0–10.5)
nRBC: 0 % (ref 0.0–0.2)

## 2020-11-13 LAB — INSULIN ANTIBODIES, BLOOD: Insulin Antibodies, Human: <5 uU/mL

## 2020-11-13 LAB — GLUCOSE, CAPILLARY
Glucose-Capillary: 103 mg/dL — ABNORMAL HIGH (ref 70–99)
Glucose-Capillary: 109 mg/dL — ABNORMAL HIGH (ref 70–99)
Glucose-Capillary: 117 mg/dL — ABNORMAL HIGH (ref 70–99)
Glucose-Capillary: 122 mg/dL — ABNORMAL HIGH (ref 70–99)
Glucose-Capillary: 125 mg/dL — ABNORMAL HIGH (ref 70–99)
Glucose-Capillary: 140 mg/dL — ABNORMAL HIGH (ref 70–99)
Glucose-Capillary: 84 mg/dL (ref 70–99)
Glucose-Capillary: 95 mg/dL (ref 70–99)

## 2020-11-13 LAB — FERRITIN: Ferritin: 603 ng/mL — ABNORMAL HIGH (ref 11–307)

## 2020-11-13 LAB — FIBRIN DERIVATIVES D-DIMER (ARMC ONLY): Fibrin derivatives D-dimer (ARMC): 690.29 ng/mL (FEU) — ABNORMAL HIGH (ref 0.00–499.00)

## 2020-11-13 LAB — C-REACTIVE PROTEIN: CRP: 0.8 mg/dL (ref <1.0)

## 2020-11-13 NOTE — Progress Notes (Addendum)
Pt unable to urinate post foley removal. Bladder scan showed 241 ml. MD on call notified. In an out straight cath performed via aseptic technique. Drained 350 ml of clear amber urine. Turned and repositioned pt.  New pure wick applied.

## 2020-11-13 NOTE — Consult Note (Signed)
WOC Nurse Consult Note: Reason for Consult:Deep tissue injury to right posterior thigh, linear maroon discoloration.  Moisture associated skin damage to bilateral gluteal folds  Incontinent of urine. Currently has purewick in place to divert urine.  Will continue use and skin contact with dermatherapy therapeutic linen to wick moisture and improve skin microclimate.  Wound type:MASD and pressure.  Pressure Injury POA: Yes Measurement: 6 cm x 1 cm intact maroon discoloration to right posterior thigh.  GLuteal folds with 4 cm x 3 cm x 0.1 cm denuded skin.  Will implement barrier cream and contact with therapeutic linen.  No disposable briefs or underpads.  Continue purewick.  Wound bed: pink moist Drainage (amount, consistency, odor) scant weeping.  Periwound: intact Dressing procedure/placement/frequency: Cleanse buttocks with soap and water and pat dry. Apply barrier cream twice daily and PRn soilage.  Silicone foam dressing to deep tissue injury on right posterior thigh.  Change every three days and PRN soilage.  Will not follow at this time.  Please re-consult if needed.  Alexis Moras MSN, RN, FNP-BC CWON Wound, Ostomy, Continence Nurse Pager [phone]

## 2020-11-13 NOTE — Progress Notes (Signed)
Physical Therapy Treatment Patient Details Name: Alexis Rodriguez MRN: [id number] DOB: 1941-02-18 Today's Date: 11/13/2020    History of Present Illness presented to ER secondary to AMS, periods of unresponsiveness, questionable seizure-like activity; admitted for TIA/CVA work up.  MRI negative for acute insult.    PT Comments    Pt seen for strengthening and circulation exercises to LE's in supine.  AAROM bilaterally consisting of PF/DF, hip flexion, hip ABD/ADD, and hip IR/ER x 10-15reps.  Assistance needed due to fatigue and challenges following commands at times.  Pt alert throughout session, answering questions appropriately and engaging in conversation. Pt without c/o pain and able to speak with daughter briefly on phone during rest period.  Pt positioned to comfort, call bell in reach, and bed alarm on.  Continue PT per POC and pt tolerance.   Follow Up Recommendations  SNF     Equipment Recommendations  Rolling walker with 5" wheels;3in1 (PT)    Recommendations for Other Services       Precautions / Restrictions Precautions Precautions: Fall    Mobility  Bed Mobility Overal bed mobility: Needs Assistance Bed Mobility: Rolling Rolling: Max assist         General bed mobility comments: Pt repositioned in bed to facilitate skin integrity  Transfers                    Ambulation/Gait                 Stairs             Wheelchair Mobility    Modified Rankin (Stroke Patients Only)       Balance                                            Cognition Arousal/Alertness: Awake/alert Behavior During Therapy: Flat affect Overall Cognitive Status: Impaired/Different from baseline Area of Impairment: Attention;Awareness;Orientation                       Following Commands: Follows one step commands inconsistently     Problem Solving: Slow processing        Exercises Total Joint Exercises Ankle Circles/Pumps:  PROM;AAROM;Both;15 reps;Supine (consisting of PF/DF, hip ABD/ADD, hip IR/ER, Heel slides.) Other Exercises Other Exercises: B prolonged heelcord stretches    General Comments General comments (skin integrity, edema, etc.):  (Deep tissue injury noted on Right posterior thigh)      Pertinent Vitals/Pain Pain Assessment: No/denies pain    Home Living                      Prior Function            PT Goals (current goals can now be found in the care plan section) Acute Rehab PT Goals Patient Stated Goal: wants to go home    Frequency    Min 2X/week      PT Plan      Co-evaluation              AM-PAC PT "6 Clicks" Mobility   Outcome Measure  Help needed turning from your back to your side while in a flat bed without using bedrails?: A Little Help needed moving from lying on your back to sitting on the side of a flat bed without using bedrails?: A Lot Help needed moving to  and from a bed to a chair (including a wheelchair)?: A Little Help needed standing up from a chair using your arms (e.g., wheelchair or bedside chair)?: A Little Help needed to walk in hospital room?: A Little Help needed climbing 3-5 steps with a railing? : A Lot 6 Click Score: 16    End of Session   Activity Tolerance: Patient limited by fatigue Patient left: in bed;with call bell/phone within reach;with bed alarm set;with family/visitor present;with SCD's reapplied   PT Visit Diagnosis: Muscle weakness (generalized) (M62.81);Difficulty in walking, not elsewhere classified (R26.2)     Time: 1740-8144 PT Time Calculation (min) (ACUTE ONLY): 42 min  Charges:  $Therapeutic Activity: 38-52 mins                     Mikel Cella, PTA   Alexis Rodriguez 11/13/2020, 1:56 PM

## 2020-11-13 NOTE — Progress Notes (Signed)
PROGRESS NOTE    Alexis Rodriguez  TYO:[id number] DOB: 24-May-1941 DOA: 11/04/2020 PCP: Perrin Maltese, MD   Brief Narrative: Taken from H&P. Alexis Rodriguez is a 79 y.o. female with medical history significant of hypertension, hyperlipidemia, dementia, iron deficiency anemia, CKD-3, who presents with strokelike symptoms.  Per her daughter, patient has strokelike symptoms for about 1 month. Symptoms have been going on intermittently, including confusion, blurry vision, slurred speech, slow response with staring off. No episode of fully loss of consciousness. No facial droop. Patient moves all extremities. Patient does not have chest pain, cough, shortness breath, fever or chills. No nausea, vomiting, diarrhea, abdominal pain, symptoms of UTI.   She was also found to have hypoglycemia without any prior diagnosis of diabetes or being on any antidiabetic medications. CT head and MRI without any acute infarct.  Continue to drop blood glucose level.  Requiring D10. Neurology was consulted-recommending stroke work-up and EEG, which was negative. Persistent hyperinsulinism hypoglycemia, rest of the labs pending. Responded well to glucagon and octreotide in the beginning, later maintaining blood glucose on diazoxide, which was discontinued due to urinary retention and she was restarted on octreotide. Patient developed bradycardia and become febrile on 11/10/2020, cardiology was consulted and they recommend discontinuing octreotide stating that it can cause conduction defects although patient just received 1 dose and bradycardia was present before getting that octreotide. Also developed hyponatremia, responding well to volume repletion. Blood glucose level currently maintained on normal saline with D5.  Cardiology repeated Covid testing today and it came back positive.  Negative on admission.  Monoclonal antibody ordered.  Subjective: She is awake, denies any shortness of breath, no nausea or  vomiting  Assessment & Plan:   Principal Problem:   Stroke-like symptoms Active Problems:   HTN (hypertension)   Hypoglycemia   Dementia (HCC)   HLD (hyperlipidemia)   Iron deficiency anemia   CKD (chronic kidney disease), stage IIIa   Depression   Pressure injury of skin   Third degree AV block (HCC)   AKI (acute kidney injury) ([place])   Heart block  Stroke right symptoms/persistent intermittent hypoglycemia.  Imaging is negative for any acute infarct.  Echocardiogram with normal EF and grade 1 diastolic dysfunction.  A1c of 4.7.  EEG without any epileptiform changes. Very vague symptoms might be due to intermittent hypoglycemia. Intermittent symptoms for 1 month, where she intermittently seems more confused, staring and sometimes sweating.  Per daughter she is having good p.o. intake and ready consistent with her routine. Patient has underlying dementia and is oriented to self only at baseline. Patient responded well to glucagon challenge and later blood sugar increased above 200 with octreotide subcu. had curbside discussion with endocrinology and our pharmacist and decided to start her on diazoxide, developed urinary retention requiring Foley catheter, Diazoxide was discontinued and plan was to restart octreotide, received 1 dose of octreotide and there was some concern of conduction abnormalities with 1:2 block, cardiology advised to discontinue octreotide.  Currently on normal saline with D5 and maintaining blood glucose level. Insulin and C-peptide elevated. Sulfonylurea , pro insuline, insulin antibodies levels still pending. A.m. cortisol mildly elevated. TSH within normal limit Pancreatic protocol MRI without any significant abnormality. 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 -Continue CBG monitoring. -Dr. Reesa Chew discussed with Dr. Gabriel Carina from endocrinology.   -Plan is to hold dextrose infusion and repeat labs including C-peptide, insulin levels, IGF  2, beta hydroxybutyrate once patient's blood sugars drop below 70 with symptoms are below 50  without symptoms -Once labs are drawn, she can be restarted on dextrose infusion  Fever.  Patient has been having recurrent fevers. CBC with no leukocytosis, no lactic acidosis, procalcitonin is not markedly elevated, blood cultures remain negative.  Urine culture showed no growth. CMP with AKI and transaminitis along with hyponatremia. Remained febrile over the past 24-hour.  Antibiotics were added for concern of aspiration although chest x-ray without any acute changes. -Continue Rocephin and Flagyl-allergic to amoxicillin. -Continue to follow fever curve.  COVID-19 positive.  Cardiology reswabbed patient and it came back positive for COVID-19. -Put her on precautions. -She received a dose of monoclonal antibody on 12/28 -Continue supportive measures  Bradycardia/conduction abnormalities.  Patient's baseline heart rate was in 50s, decreased to 40s and telemetry called with concern of heart block. Cardiology was consulted and they really think that octreotide is playing a role advising discontinuation of octreotide. We will monitor patient without octreotide.  Hyponatremia.  New onset hyponatremia.  Sodium nadir at 126, hyponatremia labs consistent with hypovolemic hyponatremia, responding appropriately with normal saline.  Zoloft was discontinued. -Continue to monitor  AKI and urinary retention/CKD stage IIIa.  Patient developed urinary retention and AKI. Overnight in and out with more than 600 cc.  No urinary output this morning so bladder scan done and it shows more than 1 L.  Foley catheter was placed. Creatinine started improving.  Renal ultrasound within normal limit. -Monitor kidney functions. -Avoid nephrotoxins -Voiding trial performed on 12/30, and unfortunately patient did not pass urine -Foley catheter has been replaced  Cough.  Per nursing concern that she developed new cough over  the past 24-hour.  Remained afebrile.  Concern of aspiration. -Chest x-ray-bilateral atelectasis, no obvious infiltrate. -Speech and swallow evaluation-concern of aspiration with thin liquids, diet changed to nectar thick.  Physical deconditioning.  PT recommending SNF.  Family is agreeable. -TOC started working on it.  Pyuria.  No urinary symptoms.  Urine cultures are pending.Per daughter received recent 3 day course of cipro.by PCP for aspiration UTI. - urine cultures-multiple species. -Repeat urine culture is negative.  Hypertension.  She was not on any antihypertensives at home.  Blood pressure within goal today. -Continue to monitor. -Hydralazine as needed for systolic above 673.  Depression.  No acute concern. -Continue home dose of  Olanzapine. -Discontinue Zoloft as she developed hyponatremia.  Dementia ([place]) -Continue Namzaric  HLD (hyperlipidemia) -Continue Tricor  Iron deficiency anemia: Hemoglobin 13.7 -Continue iron supplement  Objective: Vitals:   11/13/20 1221 11/13/20 1629 11/13/20 1721 11/13/20 1930  BP: (!) 140/55 (!) 158/68  (!) 160/85  Pulse: (!) 52 60  69  Resp: 18 18  18   Temp: 97.6 F (36.4 C) 98.8 F (37.1 C)  98.3 F (36.8 C)  TempSrc: Oral   Oral  SpO2: 100%  96% 96%  Weight:      Height:        Intake/Output Summary (Last 24 hours) at 11/13/2020 2046 Last data filed at 11/13/2020 1726 Gross per 24 hour  Intake 1178.25 ml  Output 700 ml  Net 478.25 ml   Filed Weights   11/04/20 1340  Weight: 78.7 kg    Examination:  General exam: Alert, awake, no distress Respiratory system: Clear to auscultation. Respiratory effort normal. Cardiovascular system:RRR. No murmurs, rubs, gallops. Gastrointestinal system: Abdomen is nondistended, soft and nontender. No organomegaly or masses felt. Normal bowel sounds heard. Central nervous system:  No focal neurological deficits. Extremities: No C/C/E, +pedal pulses Skin: No rashes, lesions or  ulcers  Psychiatry: Judgement and insight appear normal. Mood & affect appropriate.     DVT prophylaxis: Lovenox Code Status: Full Family Communication: Discussed with daughter on phone. Disposition Plan:  Status is: Inpatient  Remains inpatient appropriate because:Inpatient level of care appropriate due to severity of illness   Dispo: The patient is from: Home              Anticipated d/c is to: SNF              Anticipated d/c date is: 1-2 days.              Patient currently is not medically stable.  TOC is looking for placement.   Consultants:   Neurology  Curbside endocrinology  Cardiology  Procedures:  Antimicrobials:  Rocephin Flagyl  Data Reviewed: I have personally reviewed following labs and imaging studies  CBC: Recent Labs  Lab 11/07/20 0608 11/10/20 0217 11/12/20 0501 11/13/20 0558  WBC 8.9 5.7 3.9* 3.3*  NEUTROABS  --  3.7  --   --   HGB 13.2 13.0 11.2* 11.0*  HCT 38.8 39.2 34.3* 34.0*  MCV 89.6 89.9 93.5 93.2  PLT 271 241 181 865   Basic Metabolic Panel: Recent Labs  Lab 11/10/20 1642 11/11/20 0548 11/11/20 1700 11/12/20 0501 11/13/20 0558  NA 128* 133* 137 139 143  K 4.5 4.4 4.4 4.2 3.9  CL 94* 102 104 105 110  CO2 22 26 24 27 26   GLUCOSE 199* 181* 192* 140* 111*  BUN 50* 41* 36* 34* 31*  CREATININE 2.03* 1.63* 1.46* 1.38* 1.14*  CALCIUM 8.2* 8.0* 8.0* 8.2* 8.0*  MG 1.9  --   --   --   --    GFR: Estimated Creatinine Clearance: 39.1 mL/min (A) (by C-G formula based on SCr of 1.14 mg/dL (H)). Liver Function Tests: Recent Labs  Lab 11/10/20 0217 11/13/20 0558  AST 161* 60*  ALT 54* 28  ALKPHOS 30* 20*  BILITOT 0.6 0.5  PROT 6.9 5.6*  ALBUMIN 3.4* 2.4*   No results for input(s): LIPASE, AMYLASE in the last 168 hours. No results for input(s): AMMONIA in the last 168 hours. Coagulation Profile: Recent Labs  Lab 11/10/20 0217  INR 1.3*   Cardiac Enzymes: No results for input(s): CKTOTAL, CKMB, CKMBINDEX, TROPONINI in  the last 168 hours. BNP (last 3 results) No results for input(s): PROBNP in the last 8760 hours. HbA1C: No results for input(s): HGBA1C in the last 72 hours. CBG: Recent Labs  Lab 11/13/20 0412 11/13/20 0804 11/13/20 1218 11/13/20 1625 11/13/20 1840  GLUCAP 84 109* 95 125* 117*   Lipid Profile: No results for input(s): CHOL, HDL, LDLCALC, TRIG, CHOLHDL, LDLDIRECT in the last 72 hours. Thyroid Function Tests: No results for input(s): TSH, T4TOTAL, FREET4, T3FREE, THYROIDAB in the last 72 hours. Anemia Panel: Recent Labs    11/13/20 0558  FERRITIN 603*   Sepsis Labs: Recent Labs  Lab 11/10/20 0217 11/10/20 0500 11/11/20 0548 11/12/20 0501  PROCALCITON 0.48  --  0.23 0.17  LATICACIDVEN 1.6 1.5  --   --     Recent Results (from the past 240 hour(s))  Resp Panel by RT-PCR (Flu A&B, Covid) Nasopharyngeal Swab     Status: None   Collection Time: 11/04/20  3:14 PM   Specimen: Nasopharyngeal Swab; Nasopharyngeal(NP) swabs in vial transport medium  Result Value Ref Range Status   SARS Coronavirus 2 by RT PCR NEGATIVE NEGATIVE Final    Comment: (NOTE) SARS-CoV-2 target nucleic acids are  NOT DETECTED.  The SARS-CoV-2 RNA is generally detectable in upper respiratory specimens during the acute phase of infection. The lowest concentration of SARS-CoV-2 viral copies this assay can detect is 138 copies/mL. A negative result does not preclude SARS-Cov-2 infection and should not be used as the sole basis for treatment or other patient management decisions. A negative result may occur with  improper specimen collection/handling, submission of specimen other than nasopharyngeal swab, presence of viral mutation(s) within the areas targeted by this assay, and inadequate number of viral copies(<138 copies/mL). A negative result must be combined with clinical observations, patient history, and epidemiological information. The expected result is Negative.  Fact Sheet for Patients:   EntrepreneurPulse.com.au  Fact Sheet for Healthcare Providers:  IncredibleEmployment.be  This test is no t yet approved or cleared by the [place] FDA and  has been authorized for detection and/or diagnosis of SARS-CoV-2 by FDA under an Emergency Use Authorization (EUA). This EUA will remain  in effect (meaning this test can be used) for the duration of the COVID-19 declaration under Section 564(b)(1) of the Act, 21 U.S.C.section 360bbb-3(b)(1), unless the authorization is terminated  or revoked sooner.       Influenza A by PCR NEGATIVE NEGATIVE Final   Influenza B by PCR NEGATIVE NEGATIVE Final    Comment: (NOTE) The Xpert Xpress SARS-CoV-2/FLU/RSV plus assay is intended as an aid in the diagnosis of influenza from Nasopharyngeal swab specimens and should not be used as a sole basis for treatment. Nasal washings and aspirates are unacceptable for Xpert Xpress SARS-CoV-2/FLU/RSV testing.  Fact Sheet for Patients: EntrepreneurPulse.com.au  Fact Sheet for Healthcare Providers: IncredibleEmployment.be  This test is not yet approved or cleared by the [place] FDA and has been authorized for detection and/or diagnosis of SARS-CoV-2 by FDA under an Emergency Use Authorization (EUA). This EUA will remain in effect (meaning this test can be used) for the duration of the COVID-19 declaration under Section 564(b)(1) of the Act, 21 U.S.C. section 360bbb-3(b)(1), unless the authorization is terminated or revoked.  Performed at [facility], [address] [place], [place] [address]   Urine Culture     Status: Abnormal   Collection Time: 11/04/20  3:42 PM   Specimen: Urine, Random  Result Value Ref Range Status   Specimen Description   Final    URINE, RANDOM Performed at [facility], [address] [place], [place] [address]    Special Requests   Final    NONE Performed at  [facility], [address] [place], [place] [address]    Culture MULTIPLE SPECIES PRESENT, SUGGEST RECOLLECTION (A)  Final   Report Status 11/06/2020 FINAL  Final  Culture, blood (x 2)     Status: None (Preliminary result)   Collection Time: 11/10/20  2:17 AM   Specimen: BLOOD  Result Value Ref Range Status   Specimen Description BLOOD BLOOD RIGHT HAND  Final   Special Requests   Final    BOTTLES DRAWN AEROBIC AND ANAEROBIC Blood Culture results may not be optimal due to an inadequate volume of blood received in culture bottles   Culture   Final    NO GROWTH 3 DAYS Performed at [facility], [address] [place],  [address]    Report Status PENDING  Incomplete  Culture, blood (x 2)     Status: None (Preliminary result)   Collection Time: 11/10/20  2:18 AM   Specimen: BLOOD  Result Value Ref Range Status  Specimen Description BLOOD BLOOD LEFT HAND  Final   Special Requests   Final    BOTTLES DRAWN AEROBIC AND ANAEROBIC Blood Culture adequate volume   Culture   Final    NO GROWTH 3 DAYS Performed at [facility], [address] [place], [place] [address]    Report Status PENDING  Incomplete  Culture, Urine     Status: None   Collection Time: 11/10/20  4:49 AM   Specimen: Urine, Random  Result Value Ref Range Status   Specimen Description   Final    URINE, RANDOM Performed at [facility], [address] [place], [place] [address]    Special Requests   Final    NONE Performed at [facility], [address] [place], [place] [address]    Culture   Final    NO GROWTH Performed at [facility], [place] [address] [place], [place] [address]    Report Status 11/11/2020 FINAL  Final  Resp Panel by RT-PCR (Flu A&B, Covid) Nasopharyngeal Swab     Status: Abnormal   Collection Time: 11/11/20 12:30 PM   Specimen: Nasopharyngeal Swab; Nasopharyngeal(NP) swabs in vial transport medium  Result Value Ref Range Status    SARS Coronavirus 2 by RT PCR POSITIVE (A) NEGATIVE Final    Comment: RESULT CALLED TO, READ BACK BY AND VERIFIED WITH:  TIFFANY ROBINSON 1336 11/11/20 SDR (NOTE) SARS-CoV-2 target nucleic acids are DETECTED.  The SARS-CoV-2 RNA is generally detectable in upper respiratory specimens during the acute phase of infection. Positive results are indicative of the presence of the identified virus, but do not rule out bacterial infection or co-infection with other pathogens not detected by the test. Clinical correlation with patient history and other diagnostic information is necessary to determine patient infection status. The expected result is Negative.  Fact Sheet for Patients: EntrepreneurPulse.com.au  Fact Sheet for Healthcare Providers: IncredibleEmployment.be  This test is not yet approved or cleared by the [place] FDA and  has been authorized for detection and/or diagnosis of SARS-CoV-2 by FDA under an Emergency Use Authorization (EUA).  This EUA will remain in effect (meaning this test can  be used) for the duration of  the COVID-19 declaration under Section 564(b)(1) of the Act, 21 U.S.C. section 360bbb-3(b)(1), unless the authorization is terminated or revoked sooner.     Influenza A by PCR NEGATIVE NEGATIVE Final   Influenza B by PCR NEGATIVE NEGATIVE Final    Comment: (NOTE) The Xpert Xpress SARS-CoV-2/FLU/RSV plus assay is intended as an aid in the diagnosis of influenza from Nasopharyngeal swab specimens and should not be used as a sole basis for treatment. Nasal washings and aspirates are unacceptable for Xpert Xpress SARS-CoV-2/FLU/RSV testing.  Fact Sheet for Patients: EntrepreneurPulse.com.au  Fact Sheet for Healthcare Providers: IncredibleEmployment.be  This test is not yet approved or cleared by the [place] FDA and has been authorized for detection and/or diagnosis of  SARS-CoV-2 by FDA under an Emergency Use Authorization (EUA). This EUA will remain in effect (meaning this test can be used) for the duration of the COVID-19 declaration under Section 564(b)(1) of the Act, 21 U.S.C. section 360bbb-3(b)(1), unless the authorization is terminated or revoked.  Performed at [facility], [address] [place], [place] [address]   Resp panel by RT-PCR (RSV, Flu A&B, Covid) Nasopharyngeal Swab     Status: Abnormal   Collection Time: 11/11/20  5:17 PM   Specimen: Nasopharyngeal Swab; Nasopharyngeal(NP) swabs in vial transport medium  Result Value Ref Range Status   SARS Coronavirus 2 by RT PCR POSITIVE (A) NEGATIVE Final    Comment: RESULT CALLED TO, READ BACK BY AND VERIFIED WITH: STEPHANIE ROBINSON 11/11/20 1838 KLW (NOTE) SARS-CoV-2 target nucleic acids are DETECTED.  The SARS-CoV-2 RNA is generally detectable in upper respiratory specimens during the acute phase of infection. Positive results are indicative of the presence of the identified virus, but do not rule out bacterial infection or co-infection with other pathogens not detected by the test. Clinical correlation with patient history and other diagnostic information is necessary to determine patient infection status. The expected result is Negative.  Fact Sheet for Patients: EntrepreneurPulse.com.au  Fact Sheet for Healthcare Providers: IncredibleEmployment.be  This test is not yet approved or cleared by the [place] FDA and  has been authorized for detection and/or diagnosis of SARS-CoV-2 by FDA under an Emergency Use Authorization (EUA).  This EUA will remain in effect (meaning this test can  be used) for the duration of  the COVID-19 declaration under Section 564(b)(1) of the Act, 21 U.S.C. section 360bbb-3(b)(1), unless the authorization is terminated or revoked sooner.     Influenza A by PCR NEGATIVE NEGATIVE Final   Influenza B  by PCR NEGATIVE NEGATIVE Final    Comment: (NOTE) The Xpert Xpress SARS-CoV-2/FLU/RSV plus assay is intended as an aid in the diagnosis of influenza from Nasopharyngeal swab specimens and should not be used as a sole basis for treatment. Nasal washings and aspirates are unacceptable for Xpert Xpress SARS-CoV-2/FLU/RSV testing.  Fact Sheet for Patients: EntrepreneurPulse.com.au  Fact Sheet for Healthcare Providers: IncredibleEmployment.be  This test is not yet approved or cleared by the [place] FDA and has been authorized for detection and/or diagnosis of SARS-CoV-2 by FDA under an Emergency Use Authorization (EUA). This EUA will remain in effect (meaning this test can be used) for the duration of the COVID-19 declaration under Section 564(b)(1) of the Act, 21 U.S.C. section 360bbb-3(b)(1), unless the authorization is terminated or revoked.     Resp Syncytial Virus by PCR NEGATIVE NEGATIVE Final    Comment: (NOTE) Fact Sheet for Patients: EntrepreneurPulse.com.au  Fact Sheet for Healthcare Providers: IncredibleEmployment.be  This test is not yet approved or cleared by the [place] FDA and has been authorized for detection and/or diagnosis of SARS-CoV-2 by FDA under an Emergency Use Authorization (EUA). This EUA will remain in effect (meaning this test can be used) for the duration of the COVID-19 declaration under Section 564(b)(1) of the Act, 21 U.S.C. section 360bbb-3(b)(1), unless the authorization is terminated or revoked.  Performed at [facility], [address], [place], [place] [address]      Radiology Studies: No results found.  Scheduled Meds: . aspirin EC  81 mg Oral Daily  . Chlorhexidine Gluconate Cloth  6 each Topical Daily  . enoxaparin (LOVENOX) injection  40 mg Subcutaneous Q2200  . ferrous sulfate  325 mg Oral Daily  . magnesium gluconate  250 mg Oral Daily   . melatonin  5 mg Oral QHS  . memantine  28 mg Oral QHS  . metroNIDAZOLE  500 mg Oral Q8H  . OLANZapine  5 mg Oral QHS  . rosuvastatin  10 mg Oral Daily   Continuous Infusions: . sodium chloride    . cefTRIAXone (ROCEPHIN)  IV 2 g (11/13/20 1025)  . lactated ringers       LOS: 8 days   Time spent: 35 minutes.  Kathie Dike, MD [facility]  If 7PM-7AM,  please contact night-coverage [email or web address]  11/13/2020, 8:46 PM

## 2020-11-13 NOTE — TOC Progression Note (Signed)
Transition of Care [facility]) - Progression Note    Patient Details  Name: Alexis Rodriguez MRN: [id number] Date of Birth: [birth year]  Transition of [facility]) CM/SW [place], LCSW Phone Number: 11/13/2020, 1:03 PM  Clinical Narrative:   Patient needs COVID SNF. TOC Leadership working on getting updated list of SNFs accepting COVID positive patients.    Expected Discharge Plan: [place] Barriers to Discharge: Continued Medical Work up  Expected Discharge Plan and Services Expected Discharge Plan: [place] Choice: [place] arrangements for the past 2 months: Single Family Home                 DME Arranged: 3-N-1 DME Agency: [employer] Date DME Agency Contacted: 11/05/20   Representative spoke with at DME Agency: [place]: PT,OT           Social Determinants of Health (SDOH) Interventions    Readmission Risk Interventions No flowsheet data found.

## 2020-11-13 NOTE — Care Management Important Message (Signed)
Important Message  Patient Details  Name: Alexis Rodriguez MRN: [id number] Date of Birth: 1941-01-14   Medicare Important Message Given:  Yes  IM left for RN to deliver due to patient's isolation status.    [place], LCSW 11/13/2020, 11:47 AM

## 2020-11-14 DIAGNOSIS — N179 Acute kidney failure, unspecified: Secondary | ICD-10-CM | POA: Diagnosis not present

## 2020-11-14 DIAGNOSIS — N1831 Chronic kidney disease, stage 3a: Secondary | ICD-10-CM | POA: Diagnosis not present

## 2020-11-14 DIAGNOSIS — F0150 Vascular dementia without behavioral disturbance: Secondary | ICD-10-CM

## 2020-11-14 DIAGNOSIS — E162 Hypoglycemia, unspecified: Secondary | ICD-10-CM | POA: Diagnosis not present

## 2020-11-14 DIAGNOSIS — L89302 Pressure ulcer of unspecified buttock, stage 2: Secondary | ICD-10-CM

## 2020-11-14 DIAGNOSIS — F32A Depression, unspecified: Secondary | ICD-10-CM

## 2020-11-14 LAB — GLUCOSE, CAPILLARY
Glucose-Capillary: 98 mg/dL (ref 70–99)
Glucose-Capillary: 87 mg/dL (ref 70–99)
Glucose-Capillary: 102 mg/dL — ABNORMAL HIGH (ref 70–99)
Glucose-Capillary: 123 mg/dL — ABNORMAL HIGH (ref 70–99)
Glucose-Capillary: 106 mg/dL — ABNORMAL HIGH (ref 70–99)
Glucose-Capillary: 92 mg/dL (ref 70–99)
Glucose-Capillary: 117 mg/dL — ABNORMAL HIGH (ref 70–99)

## 2020-11-14 NOTE — Progress Notes (Signed)
PROGRESS NOTE    Alexis Rodriguez  GEZ:[id number] DOB: 05-08-1941 DOA: 11/04/2020 PCP: Perrin Maltese, MD    Brief Narrative:  Mrs. Pascual was admitted to the hospital with working diagnosis of symptomatic hypoglycemia in the setting of SARS COVID-19 viral infection.  79 year old female past medical history of hypertension, dyslipidemia, dementia, iron deficiency anemia, chronic kidney disease stage III who presented with strokelike symptoms for about 1 month.  Intermittent confusion, blurry vision, slurred speech and slow to response.  No loss of consciousness.  In the emergency department her glucose was down to 39.  Blood pressure 140/60, heart rate 68, respirate 15, oxygen saturation 96%, her lungs are clear to auscultation bilaterally, heart S1-S2, present rhythmic, soft abdomen, no lower extremity edema, she was confused and disoriented, not agitated.  Further work-up with brain MRI showed no acute infarct.  EEG negative for seizures. Patient received intravenous D10, glucagon and octreotide for persistent hypoglycemia. Octreotide was discontinued due to bradycardia. COVID-19 PCR December 28 turn positive, patient received monoclonal antibodies 65/03 with no complications.   Assessment & Plan:   Principal Problem:   Hypoglycemia Active Problems:   HTN (hypertension)   Dementia (HCC)   HLD (hyperlipidemia)   Iron deficiency anemia   CKD (chronic kidney disease), stage IIIa   Depression   Pressure injury of skin   Third degree AV block (HCC)   AKI (acute kidney injury) ([place])   1. Symptomatic hypoglycemia, ruled out for CVA. Patient is awake and alert, positive confusion but not agitation, non focal.  Capillary glucose this am 87, 102 and 123, poor oral intake, dysphagia 2 diet.  IV dextrose, octeotride has been discontinued. Continue neuro checks per unit protocol.  Physical therapy has recommended SNF.   2. AKI on CKD stage 3a/ hypoantremia. Complicated with urinary retention.  Patient had foley cathter placed, she has failed voiding trial on 12/30.  Renal function with serum cr at 1.14 yesterday.  Poor oral intake.  Mild pyuria with no fran urinary symptoms   Keep foley cathter for now and follow renal function in am.   3. HTN/ dyslipidemia. Continue blood pressure monitoring, currently off antihypertensive medications.   Continue with rosvastatin.   4. Depression and dementia/ swallow dysfunction. Continue with olanzapine, neuro checks per unit protocol. Continue with PT and OT. Nutrition consultation.  Continue with dysphagia 2 diet, chest film with no signs of aspiration pneumonia, will discontinue antibiotics.   5. Anemia with iron deficiency. Continue iron supplementation.   6. Bradycardia with third degree AV block. HR is 60 this am, with stable blood pressure systolic 546 mmHg.  ekg Hr 47, normal axis, right bundle brunch block, normal Qtc, sinus, with no st segment or t wave changes.   7. Stage 2 left buttock, stage 1 right buttock and right posterior thigh pressure ulcer (not able to stage), present on admission. Continue local skin care.   8. Positive COVID 19 infection. Sp monoclonal antibody infusion with good toleration, continue isolation for 10 days if continue asymptomatic.   Patient continue to be at high risk for worsening hypoglycemia.   Status is: Inpatient  Remains inpatient appropriate because:Inpatient level of care appropriate due to severity of illness   Dispo: The patient is from: Home              Anticipated d/c is to: SNF              Anticipated d/c date is: 2 days  Patient currently is not medically stable to d/c.   DVT prophylaxis: Enoxaparin   Code Status:   full  Family Communication:   Unable to reach her daughter, left message.     Nutrition Status:           Skin Documentation: Pressure Injury 11/05/20 Buttocks Left Stage 2 -  Partial thickness loss of dermis presenting as a shallow open  injury with a red, pink wound bed without slough. (Active)  11/05/20 1200  Location: Buttocks  Location Orientation: Left  Staging: Stage 2 -  Partial thickness loss of dermis presenting as a shallow open injury with a red, pink wound bed without slough.  Wound Description (Comments):   Present on Admission:      Pressure Injury 11/05/20 Buttocks Right Stage 1 -  Intact skin with non-blanchable redness of a localized area usually over a bony prominence. (Active)  11/05/20 1217  Location: Buttocks  Location Orientation: Right  Staging: Stage 1 -  Intact skin with non-blanchable redness of a localized area usually over a bony prominence.  Wound Description (Comments):   Present on Admission:      Pressure Injury 11/11/20 Thigh Right;Posterior deep tissue injury (Active)  11/11/20 2330  Location: Thigh  Location Orientation: Right;Posterior  Staging:   Wound Description (Comments): deep tissue injury  Present on Admission:      Subjective: Patient is calm, confused and disorientated, but not agitated, poor oral intake, no nausea or vomiting, no dyspnea.   Objective: Vitals:   11/13/20 1930 11/13/20 2357 11/14/20 0517 11/14/20 0738  BP: (!) 160/85 (!) 149/69 (!) 169/69 131/68  Pulse: 69 72 60 84  Resp: 18 18 15 19   Temp: 98.3 F (36.8 C) 99 F (37.2 C) 98.4 F (36.9 C) 98 F (36.7 C)  TempSrc: Oral Oral Oral Oral  SpO2: 96% 98% 91% 94%  Weight:      Height:        Intake/Output Summary (Last 24 hours) at 11/14/2020 1005 Last data filed at 11/14/2020 4580 Gross per 24 hour  Intake 750.15 ml  Output 900 ml  Net -149.85 ml   Filed Weights   11/04/20 1340  Weight: 78.7 kg    Examination:   General: Not in pain or dyspnea.,  Neurology: Awake and alert, non focal  E ENT: no pallor, no icterus, oral mucosa moist Cardiovascular: No JVD. S1-S2 present, rhythmic, no gallops, rubs, or murmurs. Trace non pitting lower extremity edema. Pulmonary: positive breath sounds  bilaterally, no wheezing. Gastrointestinal. Abdomen soft and non tender Skin. No rashes Musculoskeletal: no joint deformities     Data Reviewed: I have personally reviewed following labs and imaging studies  CBC: Recent Labs  Lab 11/10/20 0217 11/12/20 0501 11/13/20 0558  WBC 5.7 3.9* 3.3*  NEUTROABS 3.7  --   --   HGB 13.0 11.2* 11.0*  HCT 39.2 34.3* 34.0*  MCV 89.9 93.5 93.2  PLT 241 181 998   Basic Metabolic Panel: Recent Labs  Lab 11/10/20 1642 11/11/20 0548 11/11/20 1700 11/12/20 0501 11/13/20 0558  NA 128* 133* 137 139 143  K 4.5 4.4 4.4 4.2 3.9  CL 94* 102 104 105 110  CO2 22 26 24 27 26   GLUCOSE 199* 181* 192* 140* 111*  BUN 50* 41* 36* 34* 31*  CREATININE 2.03* 1.63* 1.46* 1.38* 1.14*  CALCIUM 8.2* 8.0* 8.0* 8.2* 8.0*  MG 1.9  --   --   --   --    GFR: Estimated Creatinine Clearance:  39.1 mL/min (A) (by C-G formula based on SCr of 1.14 mg/dL (H)). Liver Function Tests: Recent Labs  Lab 11/10/20 0217 11/13/20 0558  AST 161* 60*  ALT 54* 28  ALKPHOS 30* 20*  BILITOT 0.6 0.5  PROT 6.9 5.6*  ALBUMIN 3.4* 2.4*   No results for input(s): LIPASE, AMYLASE in the last 168 hours. No results for input(s): AMMONIA in the last 168 hours. Coagulation Profile: Recent Labs  Lab 11/10/20 0217  INR 1.3*   Cardiac Enzymes: No results for input(s): CKTOTAL, CKMB, CKMBINDEX, TROPONINI in the last 168 hours. BNP (last 3 results) No results for input(s): PROBNP in the last 8760 hours. HbA1C: No results for input(s): HGBA1C in the last 72 hours. CBG: Recent Labs  Lab 11/13/20 2353 11/14/20 0514 11/14/20 0600 11/14/20 0739 11/14/20 0944  GLUCAP 140* 98 87 102* 123*   Lipid Profile: No results for input(s): CHOL, HDL, LDLCALC, TRIG, CHOLHDL, LDLDIRECT in the last 72 hours. Thyroid Function Tests: No results for input(s): TSH, T4TOTAL, FREET4, T3FREE, THYROIDAB in the last 72 hours. Anemia Panel: Recent Labs    11/13/20 0558  FERRITIN 603*       Radiology Studies: I have reviewed all of the imaging during this hospital visit personally     Scheduled Meds: . aspirin EC  81 mg Oral Daily  . Chlorhexidine Gluconate Cloth  6 each Topical Daily  . enoxaparin (LOVENOX) injection  40 mg Subcutaneous Q2200  . ferrous sulfate  325 mg Oral Daily  . magnesium gluconate  250 mg Oral Daily  . melatonin  5 mg Oral QHS  . memantine  28 mg Oral QHS  . metroNIDAZOLE  500 mg Oral Q8H  . OLANZapine  5 mg Oral QHS  . rosuvastatin  10 mg Oral Daily   Continuous Infusions: . sodium chloride    . cefTRIAXone (ROCEPHIN)  IV 2 g (11/13/20 1025)  . lactated ringers       LOS: 9 days        Floraine Buechler Gerome Apley, MD

## 2020-11-14 NOTE — Progress Notes (Signed)
Updated daughter on the phone at this time

## 2020-11-14 NOTE — Progress Notes (Signed)
Initial Nutrition Assessment  DOCUMENTATION CODES:   Obesity unspecified  INTERVENTION:  Provide Hormel Shake po BID with lunch and dinner, each supplement provides 520 kcal and 22 grams of protein.  Provide Magic cup TID with meals, each supplement provides 290 kcal and 9 grams of protein.  NUTRITION DIAGNOSIS:   Increased nutrient needs related to catabolic illness (IRJJO-84) as evidenced by estimated needs.  GOAL:   Patient will meet greater than or equal to 90% of their needs  MONITOR:   PO intake,Supplement acceptance,Labs,Weight trends,I & O's  REASON FOR ASSESSMENT:   Consult Assessment of nutrition requirement/status  ASSESSMENT:   79 year old female past medical history of hypertension, dyslipidemia, AV block, dementia, iron deficiency anemia, depression and chronic kidney disease stage III who is admitted with COVID 19, AKI, hypoglycemia and AV block   Met with patient at bedside. She reports her appetite is fairly good. She ate 75% of her dinner last night. She reports she ate all of her breakfast this morning but cannot recall how she did with lunch. She reports at baseline eating 2 meals per day. PO intake is variable per chart (0-75%). Patient is amenable to drinking oral nutrition supplements to help meet calorie/protein needs.  Patient reports her UBW is 170 lbs. She denies any weight loss. Currently documented to be 78.7 kg (173.6 lbs).  Medications reviewed and include: ferrous sulfate 325 mg daily.  Labs reviewed: CBG 87-123.  Patient does not meet criteria for malnutrition at this time.  NUTRITION - FOCUSED PHYSICAL EXAM:  Flowsheet Row Most Recent Value  Orbital Region No depletion  Upper Arm Region No depletion  Thoracic and Lumbar Region No depletion  Buccal Region No depletion  Temple Region No depletion  Clavicle Bone Region Mild depletion  Clavicle and Acromion Bone Region Mild depletion  Scapular Bone Region No depletion  Dorsal Hand No  depletion  Patellar Region No depletion  Anterior Thigh Region No depletion  Posterior Calf Region Mild depletion  Edema (RD Assessment) Mild  Hair Reviewed  Eyes Reviewed  Mouth Reviewed  Skin Reviewed  Nails Reviewed     Diet Order:   Diet Order            DIET DYS 2 Room service appropriate? Yes with Assist; Fluid consistency: Nectar Thick  Diet effective now                EDUCATION NEEDS:   No education needs have been identified at this time  Skin:  Skin Assessment: Reviewed RN Assessment (Deep tissue injury to right posterior thigh  6 cm x 1 cm, GLuteal folds with 4 cm x 3 cm x 0.1 cm denuded skin.)  Last BM:  12/30 -type 5  Height:   Ht Readings from Last 1 Encounters:  11/05/20 $RemoveB'5\' 3"'[id number]$  (1.6 m)   Weight:   Wt Readings from Last 1 Encounters:  11/04/20 78.7 kg   BMI:  Body mass index is 30.75 kg/m.  Estimated Nutritional Needs:   Kcal:  1900-2100  Protein:  95-105 grams  Fluid:  1.9 L/day  Jacklynn Barnacle, MS, RD, LDN Pager number available on Amion

## 2020-11-15 DIAGNOSIS — F0150 Vascular dementia without behavioral disturbance: Secondary | ICD-10-CM | POA: Diagnosis not present

## 2020-11-15 DIAGNOSIS — E162 Hypoglycemia, unspecified: Secondary | ICD-10-CM | POA: Diagnosis not present

## 2020-11-15 DIAGNOSIS — N179 Acute kidney failure, unspecified: Secondary | ICD-10-CM | POA: Diagnosis not present

## 2020-11-15 DIAGNOSIS — N1831 Chronic kidney disease, stage 3a: Secondary | ICD-10-CM | POA: Diagnosis not present

## 2020-11-15 LAB — CBC WITH DIFFERENTIAL/PLATELET
Abs Immature Granulocytes: 0.07 10*3/uL (ref 0.00–0.07)
Basophils Absolute: 0 10*3/uL (ref 0.0–0.1)
Basophils Relative: 0 %
Eosinophils Absolute: 0.1 10*3/uL (ref 0.0–0.5)
Eosinophils Relative: 1 %
HCT: 36.7 % (ref 36.0–46.0)
Hemoglobin: 12.1 g/dL (ref 12.0–15.0)
Immature Granulocytes: 1 %
Lymphocytes Relative: 26 %
Lymphs Abs: 1.6 10*3/uL (ref 0.7–4.0)
MCH: 30 pg (ref 26.0–34.0)
MCHC: 33 g/dL (ref 30.0–36.0)
MCV: 91.1 fL (ref 80.0–100.0)
Monocytes Absolute: 0.4 10*3/uL (ref 0.1–1.0)
Monocytes Relative: 7 %
Neutro Abs: 4 10*3/uL (ref 1.7–7.7)
Neutrophils Relative %: 65 %
Platelets: 218 10*3/uL (ref 150–400)
RBC: 4.03 MIL/uL (ref 3.87–5.11)
RDW: 13.1 % (ref 11.5–15.5)
Smear Review: NORMAL
WBC: 6.1 10*3/uL (ref 4.0–10.5)
nRBC: 0 % (ref 0.0–0.2)

## 2020-11-15 LAB — CULTURE, BLOOD (ROUTINE X 2)
Culture: NO GROWTH
Culture: NO GROWTH
Special Requests: ADEQUATE

## 2020-11-15 LAB — GLUCOSE, CAPILLARY
Glucose-Capillary: 83 mg/dL (ref 70–99)
Glucose-Capillary: 77 mg/dL (ref 70–99)
Glucose-Capillary: 110 mg/dL — ABNORMAL HIGH (ref 70–99)
Glucose-Capillary: 117 mg/dL — ABNORMAL HIGH (ref 70–99)
Glucose-Capillary: 131 mg/dL — ABNORMAL HIGH (ref 70–99)

## 2020-11-15 LAB — BASIC METABOLIC PANEL
Anion gap: 9 (ref 5–15)
BUN: 26 mg/dL — ABNORMAL HIGH (ref 8–23)
CO2: 25 mmol/L (ref 22–32)
Calcium: 8.7 mg/dL — ABNORMAL LOW (ref 8.9–10.3)
Chloride: 108 mmol/L (ref 98–111)
Creatinine, Ser: 1.02 mg/dL — ABNORMAL HIGH (ref 0.44–1.00)
GFR, Estimated: 56 mL/min — ABNORMAL LOW (ref >60)
Glucose, Bld: 92 mg/dL (ref 70–99)
Potassium: 3.1 mmol/L — ABNORMAL LOW (ref 3.5–5.1)
Sodium: 142 mmol/L (ref 135–145)

## 2020-11-15 MED ORDER — POTASSIUM CHLORIDE 20 MEQ PO PACK
40.0000 meq | PACK | ORAL | Status: AC
Start: 2020-11-15 — End: 2020-11-15
  Administered 2020-11-15 (×2): 40 meq via ORAL
  Filled 2020-11-15 (×2): qty 2

## 2020-11-15 NOTE — Progress Notes (Signed)
Daughter updated via telephone.  She verbalized much appreciation with no further questions

## 2020-11-15 NOTE — Progress Notes (Signed)
PROGRESS NOTE    Alexis Rodriguez  WLN:[id number] DOB: 04/29/1941 DOA: 11/04/2020 PCP: Perrin Maltese, MD    Brief Narrative:  Alexis Rodriguez was admitted to the hospital with working diagnosis of symptomatic hypoglycemia in the setting of SARS COVID-19 viral infection.  80 year old female past medical history of hypertension, dyslipidemia, dementia, iron deficiency anemia, chronic kidney disease stage III who presented with strokelike symptoms for about 1 month.  Intermittent confusion, blurry vision, slurred speech and slow to response.  No loss of consciousness.  In the emergency department her glucose was down to 39.  Blood pressure 140/60, heart rate 68, respirate 15, oxygen saturation 96%, her lungs are clear to auscultation bilaterally, heart S1-S2, present rhythmic, soft abdomen, no lower extremity edema, she was confused and disoriented, not agitated.  Further work-up with brain MRI showed no acute infarct.  EEG negative for seizures. Patient received intravenous D10, glucagon and octreotide for persistent hypoglycemia. Octreotide was discontinued due to bradycardia. COVID-19 PCR December 28 turn positive, patient received monoclonal antibodies 74/08 with no complications.    Assessment & Plan:   Principal Problem:   Hypoglycemia Active Problems:   HTN (hypertension)   Dementia (HCC)   HLD (hyperlipidemia)   Iron deficiency anemia   CKD (chronic kidney disease), stage IIIa   Depression   Pressure injury of skin   Third degree AV block (HCC)   AKI (acute kidney injury) ([place])   1. Symptomatic hypoglycemia, ruled out for CVA. Capillary glucose this am 83, 77, 110. Fasting glucose 92. Patient off IV dextrose. Tolerating po well.   Continue neuro checks per unit protocol. Patient will need SNF.   2. AKI on CKD stage 3a/ hypoantremia/ hypokalemia. Complicated with urinary retention. Patient had foley cathter placed, she has failed voiding trial on 12/30.  Her urine is very  dark today. Renal function stable with serum cr at 1,0 and Na at 142 with bicarbonate at 25 and K at 3,1  Add 80 meq Kcl in 2 divided doses and follow up on renal function in am. Keep foley catheter for now.   3. HTN/ dyslipidemia. Blood pressure continue controlled off medications  On rosvastatin.   4. Depression and dementia/ swallow dysfunction. Tolerating well  dysphagia 2 diet, Pneumonia ruled out.   Continue memantine and olanzapine.   5. Anemia with iron deficiency. On iron supplementation.   6. Bradycardia with third degree AV block. ekg Hr 47, normal axis, right bundle brunch block, normal Qtc, sinus, with no st segment or t wave changes.   Continue telemetry monitoring.   7. Stage 2 left buttock, stage 1 right buttock and right posterior thigh pressure ulcer (not able to stage), present on admission. On local skin care.   8. Positive COVID 19 infection. Sp monoclonal antibody infusion with good toleration, continue isolation for 10 days if continue asymptomatic.   Stop isolation 11/22/19  Status is: Inpatient  Remains inpatient appropriate because:Inpatient level of care appropriate due to severity of illness   Dispo: The patient is from: Home              Anticipated d/c is to: SNF              Anticipated d/c date is: 2 days              Patient currently is not medically stable to d/c.   DVT prophylaxis: Enoxaparin   Code Status:   full  Family Communication:  Unable to reach her daughter over the  phone, I left a message.     Nutrition Status: Nutrition Problem: Increased nutrient needs Etiology: catabolic illness (IRJJO-84) Signs/Symptoms: estimated needs Interventions: Refer to RD note for recommendations     Skin Documentation: Pressure Injury 11/05/20 Buttocks Left Stage 2 -  Partial thickness loss of dermis presenting as a shallow open injury with a red, pink wound bed without slough. (Active)  11/05/20 1200  Location: Buttocks  Location  Orientation: Left  Staging: Stage 2 -  Partial thickness loss of dermis presenting as a shallow open injury with a red, pink wound bed without slough.  Wound Description (Comments):   Present on Admission:      Pressure Injury 11/05/20 Buttocks Right Stage 1 -  Intact skin with non-blanchable redness of a localized area usually over a bony prominence. (Active)  11/05/20 1217  Location: Buttocks  Location Orientation: Right  Staging: Stage 1 -  Intact skin with non-blanchable redness of a localized area usually over a bony prominence.  Wound Description (Comments):   Present on Admission:      Pressure Injury 11/11/20 Thigh Right;Posterior deep tissue injury (Active)  11/11/20 2330  Location: Thigh  Location Orientation: Right;Posterior  Staging:   Wound Description (Comments): deep tissue injury  Present on Admission:       Subjective: Patient reports feeling better, no nausea or vomiting, no chest pain, positive dark urine.   Objective: Vitals:   11/14/20 2356 11/15/20 0601 11/15/20 0806 11/15/20 1151  BP: (!) 152/67 (!) 160/61 (!) 147/72 (!) 149/65  Pulse: 64 (!) 59 60 64  Resp: 16 (!) 21    Temp: 98.5 F (36.9 C) 97.9 F (36.6 C) 98.6 F (37 C) 98.4 F (36.9 C)  TempSrc:    Oral  SpO2: 94% 100% 98% 98%  Weight:      Height:        Intake/Output Summary (Last 24 hours) at 11/15/2020 1241 Last data filed at 11/15/2020 1660 Gross per 24 hour  Intake -  Output 300 ml  Net -300 ml   Filed Weights   11/04/20 1340  Weight: 78.7 kg    Examination:   General: Not in pain or dyspnea, deconditioned  Neurology: Awake and alert, non focal. Positive confusion but not agitation  E ENT: milf pallor, no icterus, oral mucosa moist Cardiovascular: No JVD. S1-S2 present, rhythmic, no gallops, rubs, or murmurs. No lower extremity edema. Pulmonary: positive breath sounds bilaterally, with no wheezing, rhonchi or rales. Gastrointestinal. Abdomen soft and non tender Skin. No  rashes Musculoskeletal: no joint deformities     Data Reviewed: I have personally reviewed following labs and imaging studies  CBC: Recent Labs  Lab 11/10/20 0217 11/12/20 0501 11/13/20 0558 11/15/20 0459  WBC 5.7 3.9* 3.3* 6.1  NEUTROABS 3.7  --   --  4.0  HGB 13.0 11.2* 11.0* 12.1  HCT 39.2 34.3* 34.0* 36.7  MCV 89.9 93.5 93.2 91.1  PLT 241 181 164 630   Basic Metabolic Panel: Recent Labs  Lab 11/10/20 1642 11/11/20 0548 11/11/20 1700 11/12/20 0501 11/13/20 0558 11/15/20 0459  NA 128* 133* 137 139 143 142  K 4.5 4.4 4.4 4.2 3.9 3.1*  CL 94* 102 104 105 110 108  CO2 22 26 24 27 26 25   GLUCOSE 199* 181* 192* 140* 111* 92  BUN 50* 41* 36* 34* 31* 26*  CREATININE 2.03* 1.63* 1.46* 1.38* 1.14* 1.02*  CALCIUM 8.2* 8.0* 8.0* 8.2* 8.0* 8.7*  MG 1.9  --   --   --   --   --  GFR: Estimated Creatinine Clearance: 43.7 mL/min (A) (by C-G formula based on SCr of 1.02 mg/dL (H)). Liver Function Tests: Recent Labs  Lab 11/10/20 0217 11/13/20 0558  AST 161* 60*  ALT 54* 28  ALKPHOS 30* 20*  BILITOT 0.6 0.5  PROT 6.9 5.6*  ALBUMIN 3.4* 2.4*   No results for input(s): LIPASE, AMYLASE in the last 168 hours. No results for input(s): AMMONIA in the last 168 hours. Coagulation Profile: Recent Labs  Lab 11/10/20 0217  INR 1.3*   Cardiac Enzymes: No results for input(s): CKTOTAL, CKMB, CKMBINDEX, TROPONINI in the last 168 hours. BNP (last 3 results) No results for input(s): PROBNP in the last 8760 hours. HbA1C: No results for input(s): HGBA1C in the last 72 hours. CBG: Recent Labs  Lab 11/14/20 1606 11/14/20 2056 11/15/20 0558 11/15/20 0805 11/15/20 1214  GLUCAP 92 117* 83 77 110*   Lipid Profile: No results for input(s): CHOL, HDL, LDLCALC, TRIG, CHOLHDL, LDLDIRECT in the last 72 hours. Thyroid Function Tests: No results for input(s): TSH, T4TOTAL, FREET4, T3FREE, THYROIDAB in the last 72 hours. Anemia Panel: Recent Labs    11/13/20 0558  FERRITIN  603*      Radiology Studies: I have reviewed all of the imaging during this hospital visit personally     Scheduled Meds: . aspirin EC  81 mg Oral Daily  . Chlorhexidine Gluconate Cloth  6 each Topical Daily  . enoxaparin (LOVENOX) injection  40 mg Subcutaneous Q2200  . ferrous sulfate  325 mg Oral Daily  . magnesium gluconate  250 mg Oral Daily  . melatonin  5 mg Oral QHS  . memantine  28 mg Oral QHS  . OLANZapine  5 mg Oral QHS  . rosuvastatin  10 mg Oral Daily   Continuous Infusions: . sodium chloride       LOS: 10 days        Tavarus Poteete Gerome Apley, MD

## 2020-11-16 DIAGNOSIS — E162 Hypoglycemia, unspecified: Secondary | ICD-10-CM | POA: Diagnosis not present

## 2020-11-16 DIAGNOSIS — I442 Atrioventricular block, complete: Secondary | ICD-10-CM

## 2020-11-16 DIAGNOSIS — N1831 Chronic kidney disease, stage 3a: Secondary | ICD-10-CM | POA: Diagnosis not present

## 2020-11-16 DIAGNOSIS — N179 Acute kidney failure, unspecified: Secondary | ICD-10-CM | POA: Diagnosis not present

## 2020-11-16 DIAGNOSIS — F32A Depression, unspecified: Secondary | ICD-10-CM | POA: Diagnosis not present

## 2020-11-16 LAB — GLUCOSE, CAPILLARY
Glucose-Capillary: 62 mg/dL — ABNORMAL LOW (ref 70–99)
Glucose-Capillary: 63 mg/dL — ABNORMAL LOW (ref 70–99)
Glucose-Capillary: 68 mg/dL — ABNORMAL LOW (ref 70–99)
Glucose-Capillary: 78 mg/dL (ref 70–99)
Glucose-Capillary: 78 mg/dL (ref 70–99)
Glucose-Capillary: 79 mg/dL (ref 70–99)
Glucose-Capillary: 85 mg/dL (ref 70–99)
Glucose-Capillary: 88 mg/dL (ref 70–99)
Glucose-Capillary: 96 mg/dL (ref 70–99)

## 2020-11-16 MED ORDER — GLUCOSE 4 G PO CHEW
1.0000 | CHEWABLE_TABLET | Freq: Every day | ORAL | Status: DC
  Administered 2020-11-16 – 2020-11-20 (×5): 4 g via ORAL
  Filled 2020-11-16 (×4): qty 1

## 2020-11-16 NOTE — Progress Notes (Signed)
fsbs = 62. Pt drank cup of orange juice and a cup of cranberry juice with no problems.  To be rechecked per protocol

## 2020-11-16 NOTE — Progress Notes (Signed)
PROGRESS NOTE    Alexis Rodriguez  RFF:[id number] DOB: September 30, 1941 DOA: 11/04/2020 PCP: Perrin Maltese, MD    Brief Narrative:  Alexis Rodriguez was admitted to the hospital with working diagnosis of symptomatic hypoglycemia in the setting of SARS COVID-19 viral infection.  80 year old female past medical history of hypertension, dyslipidemia, dementia, iron deficiency anemia, chronic kidney disease stage III who presented with strokelike symptoms for about 1 month. Intermittent confusion, blurry vision, slurred speech and slow to response. No loss of consciousness. In the emergency department her glucose was down to 39. Blood pressure 140/60, heart rate 68, respirate 15, oxygen saturation 96%, her lungs are clear to auscultation bilaterally, heart S1-S2, present rhythmic, soft abdomen, no lower extremity edema, she was confused and disoriented, not agitated.  Further work-up with brain MRI showed no acute infarct. EEG negative for seizures. Patient received intravenous D10, glucagon and octreotide for persistent hypoglycemia. Octreotide was discontinued due to bradycardia. COVID-19 PCR December 28 turn positive, patient received monoclonal [id number] no complications.  Patient tolerating po well. Now pending transfer to SNF.   Assessment & Plan:   Principal Problem:   Hypoglycemia Active Problems:   HTN (hypertension)   Dementia (HCC)   HLD (hyperlipidemia)   Iron deficiency anemia   CKD (chronic kidney disease), stage IIIa   Depression   Pressure injury of skin   Third degree AV block (HCC)   AKI (acute kidney injury) ([place])   1. Symptomatic hypoglycemia, ruled out for CVA. Capillary glucose 88, 96, 85, 62, 78, Today she is more awake and reactive, tolerating po well.  Pending transfer to SNF  Add glucose tablet 4 g daily.   2. AKI on CKD stage 3a/ hypoantremia/ hypokalemia. Complicated with urinary retention. Patient had foley cathter placed, she has failed  voiding trial on 12/30.   Urine is more clear today. Follow up on renal function in am.   3. HTN/ dyslipidemia. controlled blood pressure. Continue with  rosuastatin for dyslipidemia.    4. Depression and dementia/ swallow dysfunction. Continue with dysphagia 2 diet, Pneumonia ruled out.   On memantine and olanzapine.   5. Anemia with iron deficiency. Continue with iron supplementation.   6. Bradycardia with third degree AV block. ekg Hr 47, normal axis, right bundle brunch block, normal Qtc, sinus, with no st segment or t wave changes.   Discontinue telemetry    7. Stage 2 left buttock, stage 1 right buttock and right posterior thigh pressure ulcer (not able to stage), present on admission. Continue with local skin care.   8. Positive COVID 19 infection. Sp monoclonal antibody infusion with good toleration, continue isolation for 10 days if continue asymptomatic.  Discontinue isolation on 11/21/20  Status is: Inpatient  Remains inpatient appropriate because:IV treatments appropriate due to intensity of illness or inability to take PO   Dispo: The patient is from: Home              Anticipated d/c is to: SNF              Anticipated d/c date is: 1 day              Patient currently is medically stable to d/c.   DVT prophylaxis: Enoxaparin   Code Status:   full  Family Communication:  I spoke with her daughter yesterday.      Nutrition Status: Nutrition Problem: Increased nutrient needs Etiology: catabolic illness (AQTMA-26) Signs/Symptoms: estimated needs Interventions: Refer to RD note for recommendations  Skin Documentation: Pressure Injury 11/05/20 Buttocks Left Stage 2 -  Partial thickness loss of dermis presenting as a shallow open injury with a red, pink wound bed without slough. (Active)  11/05/20 1200  Location: Buttocks  Location Orientation: Left  Staging: Stage 2 -  Partial thickness loss of dermis presenting as a shallow open injury with  a red, pink wound bed without slough.  Wound Description (Comments):   Present on Admission:      Pressure Injury 11/05/20 Buttocks Right Stage 1 -  Intact skin with non-blanchable redness of a localized area usually over a bony prominence. (Active)  11/05/20 1217  Location: Buttocks  Location Orientation: Right  Staging: Stage 1 -  Intact skin with non-blanchable redness of a localized area usually over a bony prominence.  Wound Description (Comments):   Present on Admission:      Pressure Injury 11/11/20 Thigh Right;Posterior deep tissue injury (Active)  11/11/20 2330  Location: Thigh  Location Orientation: Right;Posterior  Staging:   Wound Description (Comments): deep tissue injury  Present on Admission:      Subjective: Patient more awake and alert, no nausea or vomiting, no chest pain or dyspnea.,  Objective: Vitals:   11/15/20 2347 11/16/20 0448 11/16/20 0806 11/16/20 1151  BP: (!) 148/51 (!) 164/60 (!) 181/83 (!) 154/65  Pulse: 62 (!) 59 62 60  Resp: 18 20 20 20   Temp: 98.1 F (36.7 C) 97.9 F (36.6 C) 97.7 F (36.5 C) 97.6 F (36.4 C)  TempSrc: Oral Oral  Oral  SpO2: 95% 97% 97% 98%  Weight:      Height:        Intake/Output Summary (Last 24 hours) at 11/16/2020 1345 Last data filed at 11/16/2020 0600 Gross per 24 hour  Intake -  Output 1650 ml  Net -1650 ml   Filed Weights   11/04/20 1340  Weight: 78.7 kg    Examination:   General: Not in pain or dyspnea, deconditioned  Neurology: Awake and alert, non focal  E ENT: no pallor, no icterus, oral mucosa moist Cardiovascular: No JVD. S1-S2 present, rhythmic, no gallops, rubs, or murmurs. No lower extremity edema. Pulmonary: positive breath sounds bilaterally, Gastrointestinal. Abdomen soft and non tender Skin. No rashes Musculoskeletal: no joint deformities     Data Reviewed: I have personally reviewed following labs and imaging studies  CBC: Recent Labs  Lab 11/10/20 0217 11/12/20 0501  11/13/20 0558 11/15/20 0459  WBC 5.7 3.9* 3.3* 6.1  NEUTROABS 3.7  --   --  4.0  HGB 13.0 11.2* 11.0* 12.1  HCT 39.2 34.3* 34.0* 36.7  MCV 89.9 93.5 93.2 91.1  PLT 241 181 164 789   Basic Metabolic Panel: Recent Labs  Lab 11/10/20 1642 11/11/20 0548 11/11/20 1700 11/12/20 0501 11/13/20 0558 11/15/20 0459  NA 128* 133* 137 139 143 142  K 4.5 4.4 4.4 4.2 3.9 3.1*  CL 94* 102 104 105 110 108  CO2 22 26 24 27 26 25   GLUCOSE 199* 181* 192* 140* 111* 92  BUN 50* 41* 36* 34* 31* 26*  CREATININE 2.03* 1.63* 1.46* 1.38* 1.14* 1.02*  CALCIUM 8.2* 8.0* 8.0* 8.2* 8.0* 8.7*  MG 1.9  --   --   --   --   --    GFR: Estimated Creatinine Clearance: 43.7 mL/min (A) (by C-G formula based on SCr of 1.02 mg/dL (H)). Liver Function Tests: Recent Labs  Lab 11/10/20 0217 11/13/20 0558  AST 161* 60*  ALT 54* 28  ALKPHOS  30* 20*  BILITOT 0.6 0.5  PROT 6.9 5.6*  ALBUMIN 3.4* 2.4*   No results for input(s): LIPASE, AMYLASE in the last 168 hours. No results for input(s): AMMONIA in the last 168 hours. Coagulation Profile: Recent Labs  Lab 11/10/20 0217  INR 1.3*   Cardiac Enzymes: No results for input(s): CKTOTAL, CKMB, CKMBINDEX, TROPONINI in the last 168 hours. BNP (last 3 results) No results for input(s): PROBNP in the last 8760 hours. HbA1C: No results for input(s): HGBA1C in the last 72 hours. CBG: Recent Labs  Lab 11/16/20 0026 11/16/20 0451 11/16/20 0805 11/16/20 1154 11/16/20 1215  GLUCAP 88 96 85 62* 78   Lipid Profile: No results for input(s): CHOL, HDL, LDLCALC, TRIG, CHOLHDL, LDLDIRECT in the last 72 hours. Thyroid Function Tests: No results for input(s): TSH, T4TOTAL, FREET4, T3FREE, THYROIDAB in the last 72 hours. Anemia Panel: No results for input(s): VITAMINB12, FOLATE, FERRITIN, TIBC, IRON, RETICCTPCT in the last 72 hours.    Radiology Studies: I have reviewed all of the imaging during this hospital visit personally     Scheduled Meds: . aspirin  EC  81 mg Oral Daily  . Chlorhexidine Gluconate Cloth  6 each Topical Daily  . enoxaparin (LOVENOX) injection  40 mg Subcutaneous Q2200  . ferrous sulfate  325 mg Oral Daily  . magnesium gluconate  250 mg Oral Daily  . melatonin  5 mg Oral QHS  . memantine  28 mg Oral QHS  . OLANZapine  5 mg Oral QHS  . rosuvastatin  10 mg Oral Daily   Continuous Infusions: . sodium chloride       LOS: 11 days        Jahking Lesser Gerome Apley, MD

## 2020-11-17 DIAGNOSIS — E162 Hypoglycemia, unspecified: Secondary | ICD-10-CM | POA: Diagnosis not present

## 2020-11-17 DIAGNOSIS — F32A Depression, unspecified: Secondary | ICD-10-CM | POA: Diagnosis not present

## 2020-11-17 DIAGNOSIS — N1831 Chronic kidney disease, stage 3a: Secondary | ICD-10-CM | POA: Diagnosis not present

## 2020-11-17 DIAGNOSIS — N179 Acute kidney failure, unspecified: Secondary | ICD-10-CM | POA: Diagnosis not present

## 2020-11-17 DIAGNOSIS — L89300 Pressure ulcer of unspecified buttock, unstageable: Secondary | ICD-10-CM

## 2020-11-17 LAB — GLUCOSE, CAPILLARY
Glucose-Capillary: 98 mg/dL (ref 70–99)
Glucose-Capillary: 62 mg/dL — ABNORMAL LOW (ref 70–99)
Glucose-Capillary: 86 mg/dL (ref 70–99)
Glucose-Capillary: 96 mg/dL (ref 70–99)
Glucose-Capillary: 84 mg/dL (ref 70–99)
Glucose-Capillary: 171 mg/dL — ABNORMAL HIGH (ref 70–99)
Glucose-Capillary: 72 mg/dL (ref 70–99)

## 2020-11-17 LAB — BASIC METABOLIC PANEL
Anion gap: 9 (ref 5–15)
BUN: 25 mg/dL — ABNORMAL HIGH (ref 8–23)
CO2: 23 mmol/L (ref 22–32)
Calcium: 8.5 mg/dL — ABNORMAL LOW (ref 8.9–10.3)
Chloride: 108 mmol/L (ref 98–111)
Creatinine, Ser: 0.86 mg/dL (ref 0.44–1.00)
GFR, Estimated: >60 mL/min (ref >60)
Glucose, Bld: 101 mg/dL — ABNORMAL HIGH (ref 70–99)
Potassium: 3.9 mmol/L (ref 3.5–5.1)
Sodium: 140 mmol/L (ref 135–145)

## 2020-11-17 NOTE — Care Management Important Message (Signed)
Important Message  Patient Details  Name: Laniyah Rosenwald MRN: [id number] Date of Birth: Apr 13, 1941   Medicare Important Message Given:  Yes  IM left for RN to deliver due to patient's isolation status.   [place], LCSW 11/17/2020, 11:46 AM

## 2020-11-17 NOTE — TOC Progression Note (Addendum)
Transition of Care [facility]) - Progression Note    Patient Details  Name: Alexis Rodriguez MRN: [id number] Date of Birth: 03-23-1941  Transition of Care [facility]) CM/SW [place], LCSW Phone Number: 11/17/2020, 1:44 PM  Clinical Narrative:   Call to [facility] with [facility], they are not accepting COVID + patients. Call to [facility] with [place], she reported all of their COVID SNFs are full at this time. She will call CSW with bed availability. Waiting for list with other SNFs taking COVID.    Expected Discharge Plan: [place] Barriers to Discharge: Continued Medical Work up  Expected Discharge Plan and Services Expected Discharge Plan: [place] Choice: [place] arrangements for the past 2 months: Single Family Home                 DME Arranged: 3-N-1 DME Agency: [employer] Date DME Agency Contacted: 11/05/20   Representative spoke with at DME Agency: [place]: PT,OT           Social Determinants of Health (SDOH) Interventions    Readmission Risk Interventions No flowsheet data found.

## 2020-11-17 NOTE — Progress Notes (Signed)
PROGRESS NOTE    Alexis Rodriguez  WVP:[id number] DOB: [birth year] DOA: 11/04/2020 PCP: Perrin Maltese, MD    Brief Narrative:  Mrs. Folz was admitted to the hospital with working diagnosis of symptomatic hypoglycemia in the setting of SARS COVID-19 viral infection.  80 year old female past medical history of hypertension, dyslipidemia, dementia, iron deficiency anemia, chronic kidney disease stage III who presented with strokelike symptoms for about 1 month. Intermittent confusion, blurry vision, slurred speech and slow to response. No loss of consciousness. In the emergency department her glucose was down to 39. Blood pressure 140/60, heart rate 68, respirate 15, oxygen saturation 96%, her lungs are clear to auscultation bilaterally, heart S1-S2, present rhythmic, soft abdomen, no lower extremity edema, she was confused and disoriented, not agitated.  Further work-up with brain MRI showed no acute infarct. EEG negative for seizures. Patient received intravenous D10, glucagon and octreotide for persistent hypoglycemia. Octreotide was discontinued due to bradycardia. COVID-19 PCR December 28 turn positive, patient received monoclonal [id number] no complications.  Patient tolerating po well. Now pending transfer to SNF.   Assessment & Plan:   Principal Problem:   Hypoglycemia Active Problems:   HTN (hypertension)   Dementia (HCC)   HLD (hyperlipidemia)   Iron deficiency anemia   CKD (chronic kidney disease), stage IIIa   Depression   Pressure injury of skin   Third degree AV block (HCC)   AKI (acute kidney injury) ([place])   1. Symptomatic hypoglycemia, ruled out for CVA. Stable capillary glucose 62 to 98. Fasting glucose this am 101. Tolerating po well, continue with glucose supplementation per mouth 4 g daily.  Pending transfer to SNF.   2. AKI on CKD stage 3a/ hypoantremia/ hypokalemia. Complicated with urinary retention. Patient had foley cathter placed, she  has failed voiding trial on 12/30.  Stable renal function with serum cr at 0,86 and K at 3,9 with bicarbonate 23.   3. HTN/ dyslipidemia.  On rosuastatin for dyslipidemia. Blood pressure controlled.   4. Depression and dementia/ swallow dysfunction.Continue withdysphagia 2 diet, Pneumonia ruled out.   Tolerating well memantine and olanzapine.  5. Anemia with iron deficiency.Oniron supplementation.   6. Bradycardia with third degree AV block. ekg Hr 47, normal axis, right bundle brunch block, normal Qtc, sinus, with no st segment or t wave changes. Now off telemetry   7. Stage 2 left buttock, stage 1 right buttock and right posterior thigh pressure ulcer (not able to stage), present on admission. Local skin care. Poor mobility due to deconditioning, pending transfer to SNF.   8. Positive COVID 19 infection.Sp monoclonal antibody infusion with good toleration, continue isolation for 10 days if continue asymptomatic.  Discontinue isolation on 11/21/20. Patient asymptomatic    Status is: Inpatient  Remains inpatient appropriate because:Unsafe d/c plan and Inpatient level of care appropriate due to severity of illness   Dispo: The patient is from: Home              Anticipated d/c is to: SNF              Anticipated d/c date is: 1 day              Patient currently is medically stable to d/c.   DVT prophylaxis:  enoxaparin   Code Status:   full  Family Communication:  No family at the bedside      Nutrition Status: Nutrition Problem: Increased nutrient needs Etiology: catabolic illness (BZJIR-67) Signs/Symptoms: estimated needs Interventions: Refer to RD note for recommendations  Skin Documentation: Pressure Injury 11/05/20 Buttocks Left Stage 2 -  Partial thickness loss of dermis presenting as a shallow open injury with a red, pink wound bed without slough. (Active)  11/05/20 1200  Location: Buttocks  Location Orientation: Left  Staging: Stage  2 -  Partial thickness loss of dermis presenting as a shallow open injury with a red, pink wound bed without slough.  Wound Description (Comments):   Present on Admission:      Pressure Injury 11/05/20 Buttocks Right Stage 1 -  Intact skin with non-blanchable redness of a localized area usually over a bony prominence. (Active)  11/05/20 1217  Location: Buttocks  Location Orientation: Right  Staging: Stage 1 -  Intact skin with non-blanchable redness of a localized area usually over a bony prominence.  Wound Description (Comments):   Present on Admission:      Pressure Injury 11/11/20 Thigh Right;Posterior deep tissue injury (Active)  11/11/20 2330  Location: Thigh  Location Orientation: Right;Posterior  Staging:   Wound Description (Comments): deep tissue injury  Present on Admission:     Subjective: Patient stable, tolerating po well, continue to be very weak and deconditioned,   Objective: Vitals:   11/16/20 2333 11/17/20 0416 11/17/20 0805 11/17/20 1155  BP: (!) 159/68 (!) 163/60 (!) 153/69 (!) 155/98  Pulse: 64 (!) 55 69 60  Resp: 20 18 18    Temp: 38.2 F (36.7 C) 97.9 F (36.6 C) 98.8 F (37.1 C) 97.6 F (36.4 C)  TempSrc:    Oral  SpO2: 96% 97% 99% 95%  Weight:      Height:        Intake/Output Summary (Last 24 hours) at 11/17/2020 1334 Last data filed at 11/17/2020 0010 Gross per 24 hour  Intake -  Output 800 ml  Net -800 ml   Filed Weights   11/04/20 1340  Weight: 78.7 kg    Examination:   General: Not in pain or dyspnea, deconditioned  Neurology: Awake and alert, non focal. Mild confusion but not agitation  E ENT: no pallor, no icterus, oral mucosa moist Cardiovascular: No JVD. S1-S2 present, rhythmic, no gallops, rubs, or murmurs. No lower extremity edema. Pulmonary: vesicular breath sounds bilaterally, adequate air movement, no wheezing, rhonchi or rales. Gastrointestinal. Abdomen soft and non tender Skin. No rashes Musculoskeletal: no joint  deformities Foley catheter in place.     Data Reviewed: I have personally reviewed following labs and imaging studies  CBC: Recent Labs  Lab 11/12/20 0501 11/13/20 0558 11/15/20 0459  WBC 3.9* 3.3* 6.1  NEUTROABS  --   --  4.0  HGB 11.2* 11.0* 12.1  HCT 34.3* 34.0* 36.7  MCV 93.5 93.2 91.1  PLT 181 164 505   Basic Metabolic Panel: Recent Labs  Lab 11/10/20 1642 11/11/20 0548 11/11/20 1700 11/12/20 0501 11/13/20 0558 11/15/20 0459 11/17/20 0657  NA 128*   < > 137 139 143 142 140  K 4.5   < > 4.4 4.2 3.9 3.1* 3.9  CL 94*   < > 104 105 110 108 108  CO2 22   < > 24 27 26 25 23   GLUCOSE 199*   < > 192* 140* 111* 92 101*  BUN 50*   < > 36* 34* 31* 26* 25*  CREATININE 2.03*   < > 1.46* 1.38* 1.14* 1.02* 0.86  CALCIUM 8.2*   < > 8.0* 8.2* 8.0* 8.7* 8.5*  MG 1.9  --   --   --   --   --   --    < > =  values in this interval not displayed.   GFR: Estimated Creatinine Clearance: 51.8 mL/min (by C-G formula based on SCr of 0.86 mg/dL). Liver Function Tests: Recent Labs  Lab 11/13/20 0558  AST 60*  ALT 28  ALKPHOS 20*  BILITOT 0.5  PROT 5.6*  ALBUMIN 2.4*   No results for input(s): LIPASE, AMYLASE in the last 168 hours. No results for input(s): AMMONIA in the last 168 hours. Coagulation Profile: No results for input(s): INR, PROTIME in the last 168 hours. Cardiac Enzymes: No results for input(s): CKTOTAL, CKMB, CKMBINDEX, TROPONINI in the last 168 hours. BNP (last 3 results) No results for input(s): PROBNP in the last 8760 hours. HbA1C: No results for input(s): HGBA1C in the last 72 hours. CBG: Recent Labs  Lab 11/17/20 0127 11/17/20 0411 11/17/20 0609 11/17/20 0807 11/17/20 1154  GLUCAP 98 62* 86 96 84   Lipid Profile: No results for input(s): CHOL, HDL, LDLCALC, TRIG, CHOLHDL, LDLDIRECT in the last 72 hours. Thyroid Function Tests: No results for input(s): TSH, T4TOTAL, FREET4, T3FREE, THYROIDAB in the last 72 hours. Anemia Panel: No results for  input(s): VITAMINB12, FOLATE, FERRITIN, TIBC, IRON, RETICCTPCT in the last 72 hours.    Radiology Studies: I have reviewed all of the imaging during this hospital visit personally     Scheduled Meds: . aspirin EC  81 mg Oral Daily  . Chlorhexidine Gluconate Cloth  6 each Topical Daily  . enoxaparin (LOVENOX) injection  40 mg Subcutaneous Q2200  . ferrous sulfate  325 mg Oral Daily  . glucose  1 tablet Oral Daily  . magnesium gluconate  250 mg Oral Daily  . melatonin  5 mg Oral QHS  . memantine  28 mg Oral QHS  . OLANZapine  5 mg Oral QHS  . rosuvastatin  10 mg Oral Daily   Continuous Infusions:   LOS: 12 days        Milo Solana Gerome Apley, MD

## 2020-11-17 NOTE — Progress Notes (Signed)
Physical Therapy Treatment Patient Details Name: Alexis Rodriguez MRN: [id number] DOB: [birth year] Today's Date: 11/17/2020    History of Present Illness presented to ER secondary to AMS, periods of unresponsiveness, questionable seizure-like activity; admitted for TIA/CVA work up.  MRI negative for acute insult.    PT Comments    Pt agrees to supine ex as below 2 x 10 but refuses attempts at mobility on 2 attempts.  SNF remains appropriate for transition of care.  Follow Up Recommendations  SNF     Equipment Recommendations  Rolling walker with 5" wheels;3in1 (PT)    Recommendations for Other Services       Precautions / Restrictions Precautions Precautions: Fall Restrictions Weight Bearing Restrictions: No    Mobility  Bed Mobility Overal bed mobility: Needs Assistance             General bed mobility comments: refused  Transfers                    Ambulation/Gait                 Stairs             Wheelchair Mobility    Modified Rankin (Stroke Patients Only)       Balance                                            Cognition Arousal/Alertness: Awake/alert Behavior During Therapy: Flat affect Overall Cognitive Status: Impaired/Different from baseline Area of Impairment: Attention;Awareness;Orientation                       Following Commands: Follows one step commands inconsistently Safety/Judgement: Decreased awareness of safety            Exercises General Exercises - Upper Extremity Shoulder Flexion: AROM;Strengthening;Both;10 reps;Supine Elbow Flexion: AROM;Strengthening;Both;10 reps;Supine Elbow Extension: AROM;Strengthening;Both;10 reps;Supine General Exercises - Lower Extremity Ankle Circles/Pumps: AROM;Strengthening;Both;10 reps;Supine Gluteal Sets: AROM;Strengthening;Both;10 reps;Supine Short Arc Quad: AROM;Strengthening;Both;10 reps;Supine Other Exercises Other Exercises: BLE AAROM 2 x  10 ankle pumps, LAQ, heel slides, and SLR    General Comments        Pertinent Vitals/Pain Pain Assessment: No/denies pain Faces Pain Scale: Hurts little more Pain Location: sacrum Pain Descriptors / Indicators: Dull;Discomfort Pain Intervention(s): Limited activity within patient's tolerance;Repositioned    Home Living                      Prior Function            PT Goals (current goals can now be found in the care plan section) Acute Rehab PT Goals Patient Stated Goal: wants to go home Progress towards PT goals: Progressing toward goals    Frequency    Min 2X/week      PT Plan      Co-evaluation              AM-PAC PT "6 Clicks" Mobility   Outcome Measure  Help needed turning from your back to your side while in a flat bed without using bedrails?: A Lot Help needed moving from lying on your back to sitting on the side of a flat bed without using bedrails?: A Lot Help needed moving to and from a bed to a chair (including a wheelchair)?: A Lot Help needed standing up from a chair using your arms (e.g.,  wheelchair or bedside chair)?: A Lot Help needed to walk in hospital room?: A Lot Help needed climbing 3-5 steps with a railing? : Total 6 Click Score: 11    End of Session   Activity Tolerance: Patient tolerated treatment well Patient left: in bed;with call bell/phone within reach;with bed alarm set Nurse Communication: Mobility status       Time: 6438-3818 PT Time Calculation (min) (ACUTE ONLY): 25 min  Charges:  $Therapeutic Exercise: 8-22 mins                    Chesley Noon, PTA 11/17/20, 4:05 PM

## 2020-11-17 NOTE — Progress Notes (Signed)
Speech Language Pathology Treatment: Dysphagia  Patient Details Name: Alexis Rodriguez MRN: [id number] DOB: [birth year] Today's Date: 11/17/2020 Time: [id number] SLP Time Calculation (min) (ACUTE ONLY): 35 min  Assessment / Plan / Recommendation Clinical Impression  Pt seen for ongoing assessment of swallowing. She appears to continue to improve some each day though she is significantly deconditioned in the bed; Volume of speech low w/ reduced effort. 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 She stated she was interested in having trials of "water" and verbally responsive and able to follow instructions w/ min-mod cues but then only accepted 3 trials b/f refusing more. Pt is on RA now; wbc is wnl. Pt explained general aspiration precautions and agreed verbally to the need for following them especially sitting upright for all oral intake. Pt fully assisted w/ positioning d/t weakness but was able to hold bedrails. She was then given Single trials of ice chips(5), thin liquids by cup(3 accepted). No immediate, overt clinical s/s of aspiration were noted w/ any trial; respiratory status appeared to remain calm and unlabored, vocal quality clear b/t trials when assessed. Pt helped to hold Cup when drinking following instructions for single, small sips slowly. Min shaky UEs. NO straws were utiilized for better oral control. Oral phase appeared grossly [facility] for bolus management and timely A-P transfer for swallowing; oral clearing achieved w/ all consistencies. Oral tremors noted as well when attempting to take small sips of thin liquids via Cup - Spillage occurred intermittently.  Due to pt's Significant Weakness and Deconditioning overall and remaining moreso in the Bed, she is at increased risk for aspiration moreso w/ thin liquids. Recommend continue a  Dysphagia level 2 diet (minced foods, purees) w/ gravies added to moisten foods; Nectar liquids continued during meals. Recommend aspiration precautions; Pills Crushed in Puree; tray setup and positioning assistance for meals and support w/ feeding d/t weakness. Recommend Single ice chips in between meals for Pleasure post thorough oral care w/ NSG Supervision. ST services will continue to f/u w/ pt for toleration of diet and education as needed while admitted; trials to upgrade thin liquids daily/when appropriate. NSG updated. Precautions posted at bedside.     HPI HPI: Pt is a 80 y.o. female with medical history significant of hypertension, hyperlipidemia, dementia, iron deficiency anemia, CKD-3, who presents with strokelike symptoms. Per her daughter, patient has strokelike symptoms for about 1 month. Symptoms have been going on intermittently, including confusion, blurry vision, slurred speech, slow response with staring off. No episode of fully loss of consciousness. No facial droop. Patient moves all extremities. Patient does not have chest pain, cough, shortness breath, fever or chills. No nausea, vomiting, diarrhea. MRI: "1. No acute intracranial abnormality. 2. Moderately advanced temporal lobe predominant cerebral atrophy  with chronic small vessel ischemic disease. MR of Abd.: included small to moderate-sized type 1 hiatal hernia.      SLP Plan  Continue with current plan of care       Recommendations  Diet recommendations: Dysphagia 2 (fine chop);Nectar-thick liquid Liquids provided via: Cup;No straw Medication Administration: Crushed with puree (for safer swallowing) Supervision: Staff to assist with self feeding;Full supervision/cueing for compensatory strategies Compensations: Minimize environmental distractions;Slow rate;Small sips/bites;Lingual sweep for clearance of pocketing;Multiple dry swallows after each bite/sip;Follow solids with liquid Postural Changes and/or Swallow  Maneuvers: Seated upright 90 degrees;Upright 30-60 min after meal  General recommendations:  (Palliative Care consult for [place]) Oral Care Recommendations: Oral care BID;Oral care before and after PO;Staff/trained caregiver to provide oral care Follow up Recommendations: Skilled Nursing facility SLP Visit Diagnosis: Dysphagia, oropharyngeal phase (R13.12) Plan: Continue with current plan of care       GO                 Alexis Rodriguez, [place], [place] Pathologist Rehab Services [phone] [facility] 11/17/2020, 4:54 PM

## 2020-11-17 NOTE — Progress Notes (Signed)
Occupational Therapy Treatment Patient Details Name: Alexis Rodriguez MRN: [id number] DOB: December 18, 1940 Today's Date: 11/17/2020    History of present illness presented to ER secondary to AMS, periods of unresponsiveness, questionable seizure-like activity; admitted for TIA/CVA work up.  MRI negative for acute insult.   OT comments  Ms Frances was seen for OT treatment on this date. Upon arrival to room pt reclined in bed agreeable to treatment. Initially agreeable to EOB sitting however pt deferred 2/2 pain. MIN A sup>long sitting and SETUP for self-drinking long sitting in bed. Pt completed bed level exercises as described below. Pt making progress toward goals. Pt continues to benefit from skilled OT services to maximize return to PLOF and minimize risk of future falls, injury, caregiver burden, and readmission. Will continue to follow POC. Goals updated. Discharge recommendation remains appropriate.    Follow Up Recommendations  SNF    Equipment Recommendations  Other (comment) (TBD)    Recommendations for Other Services      Precautions / Restrictions Precautions Precautions: Fall Restrictions Weight Bearing Restrictions: No       Mobility Bed Mobility Overal bed mobility: Needs Assistance             General bed mobility comments: MIN A sup>long sitting         ADL either performed or assessed with clinical judgement   ADL Overall ADL's : Needs assistance/impaired                                       General ADL Comments: SETUP self-drinking long sitting in bed.               Cognition Arousal/Alertness: Awake/alert Behavior During Therapy: Flat affect Overall Cognitive Status: Impaired/Different from baseline Area of Impairment: Attention;Awareness;Orientation                       Following Commands: Follows one step commands inconsistently Safety/Judgement: Decreased awareness of safety              Exercises Exercises:  General Lower Extremity;General Upper Extremity;Other exercises General Exercises - Upper Extremity Shoulder Flexion: AROM;Strengthening;Both;10 reps;Supine Elbow Flexion: AROM;Strengthening;Both;10 reps;Supine Elbow Extension: AROM;Strengthening;Both;10 reps;Supine General Exercises - Lower Extremity Ankle Circles/Pumps: AROM;Strengthening;Both;10 reps;Supine Gluteal Sets: AROM;Strengthening;Both;10 reps;Supine Short Arc Quad: AROM;Strengthening;Both;10 reps;Supine Other Exercises Other Exercises: Pt educated re: OT role, d/c recs, HEP, ECS           Pertinent Vitals/ Pain       Pain Assessment: Faces Faces Pain Scale: Hurts little more Pain Location: sacrum Pain Descriptors / Indicators: Dull;Discomfort Pain Intervention(s): Limited activity within patient's tolerance;Repositioned         Frequency  Min 1X/week        Progress Toward Goals  OT Goals(current goals can now be found in the care plan section)  Progress towards OT goals: Progressing toward goals  Acute Rehab OT Goals Patient Stated Goal: wants to go home OT Goal Formulation: With patient Time For Goal Achievement: 12/01/20 Potential to Achieve Goals: Fair ADL Goals Pt Will Perform Grooming: with set-up;with supervision;sitting Pt Will Perform Upper Body Dressing: sitting;with min guard assist;with set-up Pt/caregiver will Perform Home Exercise Program: Increased strength;With minimal assist  Plan Discharge plan remains appropriate;Frequency remains appropriate       AM-PAC OT "6 Clicks" Daily Activity     Outcome Measure   Help from another person eating meals?: A  Little Help from another person taking care of personal grooming?: A Little Help from another person toileting, which includes using toliet, bedpan, or urinal?: A Lot Help from another person bathing (including washing, rinsing, drying)?: A Lot Help from another person to put on and taking off regular upper body clothing?: A Little Help  from another person to put on and taking off regular lower body clothing?: A Lot 6 Click Score: 15    End of Session    OT Visit Diagnosis: Unsteadiness on feet (R26.81);Muscle weakness (generalized) (M62.81);Other symptoms and signs involving cognitive function   Activity Tolerance Patient limited by lethargy;Patient limited by pain   Patient Left in bed;with call bell/phone within reach;with bed alarm set   Nurse Communication          Time: 1440-1451 OT Time Calculation (min): 11 min  Charges: OT General Charges $OT Visit: 1 Visit OT Treatments $Therapeutic Exercise: 8-22 mins   Dessie Coma, M.S. OTR/L  11/17/20, 3:19 PM  ascom [phone]

## 2020-11-18 DIAGNOSIS — R69 Illness, unspecified: Secondary | ICD-10-CM | POA: Diagnosis not present

## 2020-11-18 DIAGNOSIS — E162 Hypoglycemia, unspecified: Secondary | ICD-10-CM | POA: Diagnosis not present

## 2020-11-18 DIAGNOSIS — N179 Acute kidney failure, unspecified: Secondary | ICD-10-CM | POA: Diagnosis not present

## 2020-11-18 DIAGNOSIS — D509 Iron deficiency anemia, unspecified: Secondary | ICD-10-CM | POA: Diagnosis not present

## 2020-11-18 DIAGNOSIS — F0150 Vascular dementia without behavioral disturbance: Secondary | ICD-10-CM | POA: Diagnosis not present

## 2020-11-18 DIAGNOSIS — L89300 Pressure ulcer of unspecified buttock, unstageable: Secondary | ICD-10-CM | POA: Diagnosis not present

## 2020-11-18 DIAGNOSIS — N1832 Chronic kidney disease, stage 3b: Secondary | ICD-10-CM | POA: Diagnosis not present

## 2020-11-18 DIAGNOSIS — I442 Atrioventricular block, complete: Secondary | ICD-10-CM | POA: Diagnosis not present

## 2020-11-18 DIAGNOSIS — N1831 Chronic kidney disease, stage 3a: Secondary | ICD-10-CM | POA: Diagnosis not present

## 2020-11-18 LAB — GLUCOSE, CAPILLARY
Glucose-Capillary: 52 mg/dL — ABNORMAL LOW (ref 70–99)
Glucose-Capillary: 64 mg/dL — ABNORMAL LOW (ref 70–99)
Glucose-Capillary: 64 mg/dL — ABNORMAL LOW (ref 70–99)
Glucose-Capillary: 69 mg/dL — ABNORMAL LOW (ref 70–99)
Glucose-Capillary: 73 mg/dL (ref 70–99)
Glucose-Capillary: 74 mg/dL (ref 70–99)
Glucose-Capillary: 77 mg/dL (ref 70–99)
Glucose-Capillary: 80 mg/dL (ref 70–99)
Glucose-Capillary: 86 mg/dL (ref 70–99)
Glucose-Capillary: 89 mg/dL (ref 70–99)
Glucose-Capillary: 90 mg/dL (ref 70–99)

## 2020-11-18 NOTE — Progress Notes (Signed)
Hypoglycemic Event  CBG: 52  Treatment: 4 oz juice/soda  Symptoms: None  Follow-up CBG: Time:2130 CBG Result:80  Possible Reasons for Event: Inadequate meal intake  Comments/MD notified:Oj given to pt.pt [place]

## 2020-11-18 NOTE — Progress Notes (Addendum)
PROGRESS NOTE    Alexis Rodriguez  BTD:[id number] DOB: Apr 03, 1941 DOA: 11/04/2020 PCP: Perrin Maltese, MD    Brief Narrative:  Alexis Rodriguez was admitted to the hospital with working diagnosis of symptomatic hypoglycemia in the setting of SARS COVID-19 viral infection.  80 year old female past medical history of hypertension, dyslipidemia, dementia, iron deficiency anemia, chronic kidney disease stage III who presented with strokelike symptoms for about 1 month. Intermittent confusion, blurry vision, slurred speech and slow to response. No loss of consciousness. In the emergency department her glucose was down to 39. Blood pressure 140/60, heart rate 68, respirate 15, oxygen saturation 96%, her lungs are clear to auscultation bilaterally, heart S1-S2, present rhythmic, soft abdomen, no lower extremity edema, she was confused and disoriented, not agitated.  Further work-up with brain MRI showed no acute infarct. EEG negative for seizures. Patient received intravenous D10, glucagon and octreotide for persistent hypoglycemia. Octreotide was discontinued due to bradycardia. COVID-19 PCR December 28 turn positive, patient received monoclonal [id number] no complications.  Patient tolerating po well.   Now pending transfer to SNF.    Assessment & Plan:   Principal Problem:   Hypoglycemia Active Problems:   HTN (hypertension)   Dementia (HCC)   HLD (hyperlipidemia)   Iron deficiency anemia   CKD (chronic kidney disease), stage IIIa   Depression   Pressure injury of skin   Third degree AV block (HCC)   AKI (acute kidney injury) ([place])   1. Symptomatic hypoglycemia, ruled out for CVA. capillary glucose 86, 74, 64, 90, 77.  Continue with glucose supplementation per mouth 4 g daily. She has very poor oral intake, related to cognitive impairment.  Continue with dysphagia 2 per speech recommendations.   Pending transfer to SNF.   2. AKI on CKD stage 3a/ hypoantremia/  hypokalemia. Complicated with urinary retention. Patient had foley cathter placed, she has failed voiding trial on 12/30.  Plan for voiding trial on 01/06.    3. HTN/ dyslipidemia.Continue blood pressure monitoring, off medications.  Continue with rosuastatin  4. Depression and dementia/ swallow dysfunction.Continue withdysphagia 2 diet, Pneumonia ruled out.   Onmemantine and olanzapine.  5. Anemia with iron deficiency.Continue withiron supplementation.   6. Bradycardia with third degree AV block. ekg Hr 47, normal axis, right bundle brunch block, normal Qtc, sinus, with no st segment or t wave changes. Now off telemetry   7. Stage 2 left buttock, stage 1 right buttock and right posterior thigh pressure ulcer (not able to stage), present on admission. Continue with local skin care.  Continue to have very poor mobility due to deconditioning.  8. Positive COVID 19 infection.Sp monoclonal antibody infusion with good toleration, continue isolation for 10 days if continue asymptomatic.  Discontinue isolation on 11/21/20. Continue asymptomatic     Status is: Inpatient  Remains inpatient appropriate because:Unsafe d/c plan   Dispo: The patient is from: Home              Anticipated d/c is to: SNF              Anticipated d/c date is: 1 day              Patient currently is medically stable to d/c.   DVT prophylaxis: Enoxaparin   Code Status:   full  Family Communication:  No family at the bedside      Nutrition Status: Nutrition Problem: Increased nutrient needs Etiology: catabolic illness ([id number]) Signs/Symptoms: estimated needs Interventions: Refer to RD note for recommendations  Skin Documentation: Pressure Injury 11/05/20 Buttocks Left Stage 2 -  Partial thickness loss of dermis presenting as a shallow open injury with a red, pink wound bed without slough. (Active)  11/05/20 1200  Location: Buttocks  Location Orientation: Left  Staging:  Stage 2 -  Partial thickness loss of dermis presenting as a shallow open injury with a red, pink wound bed without slough.  Wound Description (Comments):   Present on Admission:      Pressure Injury 11/05/20 Buttocks Right Stage 1 -  Intact skin with non-blanchable redness of a localized area usually over a bony prominence. (Active)  11/05/20 1217  Location: Buttocks  Location Orientation: Right  Staging: Stage 1 -  Intact skin with non-blanchable redness of a localized area usually over a bony prominence.  Wound Description (Comments):   Present on Admission:      Pressure Injury 11/11/20 Thigh Right;Posterior deep tissue injury (Active)  11/11/20 2330  Location: Thigh  Location Orientation: Right;Posterior  Staging:   Wound Description (Comments): deep tissue injury  Present on Admission:         Subjective: Patient with poor oral intake due to cognitive impairment, no nausea or vomiting, no chest pain or dyspnea, continue to be very weak and deconditined   Objective: Vitals:   11/17/20 1548 11/17/20 2154 11/18/20 0138 11/18/20 0551  BP: (!) 159/77 (!) 159/65 (!) 142/59 (!) 160/69  Pulse: 67 65 60 60  Resp: 18 18 18 20   Temp: 98.6 F (37 C) 97.6 F (36.4 C) 97.6 F (36.4 C) 98.5 F (36.9 C)  TempSrc: Oral     SpO2: 96% 96% 98% 99%  Weight:      Height:        Intake/Output Summary (Last 24 hours) at 11/18/2020 5400 Last data filed at 11/18/2020 0556 Gross per 24 hour  Intake -  Output 500 ml  Net -500 ml   Filed Weights   11/04/20 1340  Weight: 78.7 kg    Examination:   General: Not in pain or dyspnea, deconditioned  Neurology: Awake and alert, non focal  E ENT: no pallor, no icterus, oral mucosa moist Cardiovascular: No JVD. S1-S2 present, rhythmic, no gallops, rubs, or murmurs. Trace non pitting lower extremity edema. Pulmonary: vesicular breath sounds bilaterally Gastrointestinal. Abdomen soft and non tender Skin. No rashes Musculoskeletal: no joint  deformities     Data Reviewed: I have personally reviewed following labs and imaging studies  CBC: Recent Labs  Lab 11/12/20 0501 11/13/20 0558 11/15/20 0459  WBC 3.9* 3.3* 6.1  NEUTROABS  --   --  4.0  HGB 11.2* 11.0* 12.1  HCT 34.3* 34.0* 36.7  MCV 93.5 93.2 91.1  PLT 181 164 867   Basic Metabolic Panel: Recent Labs  Lab 11/11/20 1700 11/12/20 0501 11/13/20 0558 11/15/20 0459 11/17/20 0657  NA 137 139 143 142 140  K 4.4 4.2 3.9 3.1* 3.9  CL 104 105 110 108 108  CO2 24 27 26 25 23   GLUCOSE 192* 140* 111* 92 101*  BUN 36* 34* 31* 26* 25*  CREATININE 1.46* 1.38* 1.14* 1.02* 0.86  CALCIUM 8.0* 8.2* 8.0* 8.7* 8.5*   GFR: Estimated Creatinine Clearance: 51.8 mL/min (by C-G formula based on SCr of 0.86 mg/dL). Liver Function Tests: Recent Labs  Lab 11/13/20 0558  AST 60*  ALT 28  ALKPHOS 20*  BILITOT 0.5  PROT 5.6*  ALBUMIN 2.4*   No results for input(s): LIPASE, AMYLASE in the last 168 hours. No results for  input(s): AMMONIA in the last 168 hours. Coagulation Profile: No results for input(s): INR, PROTIME in the last 168 hours. Cardiac Enzymes: No results for input(s): CKTOTAL, CKMB, CKMBINDEX, TROPONINI in the last 168 hours. BNP (last 3 results) No results for input(s): PROBNP in the last 8760 hours. HbA1C: No results for input(s): HGBA1C in the last 72 hours. CBG: Recent Labs  Lab 11/17/20 1655 11/17/20 2155 11/18/20 0116 11/18/20 0553 11/18/20 0654  GLUCAP 171* 72 73 69* 86   Lipid Profile: No results for input(s): CHOL, HDL, LDLCALC, TRIG, CHOLHDL, LDLDIRECT in the last 72 hours. Thyroid Function Tests: No results for input(s): TSH, T4TOTAL, FREET4, T3FREE, THYROIDAB in the last 72 hours. Anemia Panel: No results for input(s): VITAMINB12, FOLATE, FERRITIN, TIBC, IRON, RETICCTPCT in the last 72 hours.    Radiology Studies: I have reviewed all of the imaging during this hospital visit personally     Scheduled Meds: . aspirin EC  81  mg Oral Daily  . Chlorhexidine Gluconate Cloth  6 each Topical Daily  . enoxaparin (LOVENOX) injection  40 mg Subcutaneous Q2200  . ferrous sulfate  325 mg Oral Daily  . glucose  1 tablet Oral Daily  . magnesium gluconate  250 mg Oral Daily  . melatonin  5 mg Oral QHS  . memantine  28 mg Oral QHS  . OLANZapine  5 mg Oral QHS  . rosuvastatin  10 mg Oral Daily   Continuous Infusions:   LOS: 13 days        Avanna Sowder Gerome Apley, MD

## 2020-11-18 NOTE — Progress Notes (Signed)
Patients lunch time CBG was 64. Gave nectar thick orange juice, MD made aware.

## 2020-11-18 NOTE — Progress Notes (Signed)
Spoke to patients daughter and gave her an update on patient.

## 2020-11-19 DIAGNOSIS — R69 Illness, unspecified: Secondary | ICD-10-CM | POA: Diagnosis not present

## 2020-11-19 DIAGNOSIS — D508 Other iron deficiency anemias: Secondary | ICD-10-CM

## 2020-11-19 DIAGNOSIS — I10 Essential (primary) hypertension: Secondary | ICD-10-CM | POA: Diagnosis not present

## 2020-11-19 DIAGNOSIS — I442 Atrioventricular block, complete: Secondary | ICD-10-CM | POA: Diagnosis not present

## 2020-11-19 DIAGNOSIS — N1832 Chronic kidney disease, stage 3b: Secondary | ICD-10-CM | POA: Diagnosis not present

## 2020-11-19 DIAGNOSIS — E162 Hypoglycemia, unspecified: Secondary | ICD-10-CM | POA: Diagnosis not present

## 2020-11-19 DIAGNOSIS — D500 Iron deficiency anemia secondary to blood loss (chronic): Secondary | ICD-10-CM | POA: Diagnosis not present

## 2020-11-19 DIAGNOSIS — E7800 Pure hypercholesterolemia, unspecified: Secondary | ICD-10-CM | POA: Diagnosis not present

## 2020-11-19 DIAGNOSIS — R4182 Altered mental status, unspecified: Secondary | ICD-10-CM | POA: Diagnosis not present

## 2020-11-19 DIAGNOSIS — N179 Acute kidney failure, unspecified: Secondary | ICD-10-CM | POA: Diagnosis not present

## 2020-11-19 LAB — GLUCOSE, CAPILLARY
Glucose-Capillary: 41 mg/dL — CL (ref 70–99)
Glucose-Capillary: 79 mg/dL (ref 70–99)
Glucose-Capillary: 90 mg/dL (ref 70–99)
Glucose-Capillary: 97 mg/dL (ref 70–99)
Glucose-Capillary: 112 mg/dL — ABNORMAL HIGH (ref 70–99)
Glucose-Capillary: 76 mg/dL (ref 70–99)
Glucose-Capillary: 76 mg/dL (ref 70–99)

## 2020-11-19 LAB — FERRITIN: Ferritin: 445 ng/mL — ABNORMAL HIGH (ref 11–307)

## 2020-11-19 LAB — C-REACTIVE PROTEIN: CRP: 0.8 mg/dL (ref <1.0)

## 2020-11-19 LAB — D-DIMER, QUANTITATIVE: D-Dimer, Quant: 1.01 ug/mL-FEU — ABNORMAL HIGH (ref 0.00–0.50)

## 2020-11-19 LAB — LACTATE DEHYDROGENASE: LDH: 261 U/L — ABNORMAL HIGH (ref 98–192)

## 2020-11-19 MED ORDER — ASCORBIC ACID 500 MG PO TABS
500.0000 mg | ORAL_TABLET | Freq: Every day | ORAL | Status: DC
  Administered 2020-11-19 – 2020-11-22 (×4): 500 mg via ORAL
  Filled 2020-11-19 (×4): qty 1

## 2020-11-19 NOTE — Plan of Care (Signed)
  Problem: Education: Goal: Knowledge of secondary prevention will improve Outcome: Not Progressing Goal: Knowledge of patient specific risk factors addressed and post discharge goals established will improve Outcome: Not Progressing   Problem: Self-Care: Goal: Verbalization of feelings and concerns over difficulty with self-care will improve Outcome: Not Progressing Goal: Ability to communicate needs accurately will improve Outcome: Not Progressing   Problem: Nutrition: Goal: Dietary intake will improve Outcome: Not Progressing

## 2020-11-19 NOTE — Progress Notes (Signed)
PROGRESS NOTE    [name]  KDT:[id number] DOB: 1941/02/20 DOA: 11/04/2020 PCP: Perrin Maltese, MD     Brief Narrative:  Alexis Rodriguez was admitted to the hospital with working diagnosis of symptomatic hypoglycemia in the setting of SARS COVID-19 viral infection.  80 year old WF PMHx Dementia, HTN, dyslipidemia, iron deficiency anemia, CKD stage III   Presented with strokelike symptoms for about 1 month. Intermittent confusion, blurry vision, slurred speech and slow to response. No loss of consciousness. In the emergency department her glucose was down to 39. Blood pressure 140/60, heart rate 68, respirate 15, oxygen saturation 96%, her lungs are clear to auscultation bilaterally, heart S1-S2, present rhythmic, soft abdomen, no lower extremity edema, she was confused and disoriented, not agitated.  Further work-up with brain MRI showed no acute infarct. EEG negative for seizures. Patient received intravenous D10, glucagon and octreotide for persistent hypoglycemia. Octreotide was discontinued due to bradycardia. COVID-19 PCR December 28 turn positive, patient received monoclonal [id number] no complications.  Patient tolerating po well.   Now pending transfer to SNF.   Subjective: A/O x1 (does not know where, when, why).  Patient states she has never had a pacer.  I asked her about any arrhythmias she was not sure whether she had had any arrhythmias in the past.   Assessment & Plan: Covid vaccination;   Principal Problem:   Hypoglycemia Active Problems:   HTN (hypertension)   Dementia (HCC)   HLD (hyperlipidemia)   Iron deficiency anemia   CKD (chronic kidney disease), stage IIIa   Depression   Pressure injury of skin   Third degree AV block (HCC)   AKI (acute kidney injury) ([place])   Symptomatic hypoglycemia, ruled out for CVA. -Capillary glucose 86, 74, 64, 90, 77. -Continue with glucose supplementation per mouth 4 g daily. She has very poor oral  intake, related to cognitive impairment.  -Continue with dysphagia 2 per speech recommendations.  -1/5 DC D5W.  Patient will not be eligible for SNF while on IV IV drip  Pending transfer to SNF.  Acute on CKD stage 3a/complicated urinary retention -Patient had foley cathter placed, she has failed voiding trial on 12/30. -Voiding trial on 1/6 pending.   Essential HTN -Although patient's BP slightly high given her age would not try to tightly control BP  Dyslipidemia. -Continue withrosuastatin  Dementia & Depression / swallow dysfunction. -Continue withdysphagia 2 diet, -Pneumonia ruled out. -Not sure patient is depressed however significant dementia (A/O x1) -Onmemantine and olanzapine.  Anemia with iron deficiency. -Ferrous sulfate 325 mg daily + vitamin C 500 mg daily    Bradycardia with third degree AV block.  -EKG Hr 47, normal axis, right bundle brunch block, normal Qtc, sinus, with no st segment or t wave changes. -1/5 restart telemetry -1/5 obtain repeat EKG patient's HR currently 72.  Evaluate for third-degree AV block -1/5 per cardiology note from 12/27 no mention of third-degree AV block.  Cardiology states they suspect 2-1 AV block.  At that consult on 11/10/2020 did not feel pacemaker indicated however also stated if she has persistent confusion and 2-1 AV block after resolution of other acute medical comorbidities permanent pacemaker placement may need to be addressed  Stage 2 left buttock, stage 1 right buttock and right posterior thigh pressure ulcer (not able to stage), present on admission.  Pressure Injury 11/05/20 Buttocks Left Stage 2 -  Partial thickness loss of dermis presenting as a shallow open injury with a red, pink wound bed without slough. (Active)  11/05/20 1200  Location: Buttocks  Location Orientation: Left  Staging: Stage 2 -  Partial thickness loss of dermis presenting as a shallow open injury with a red, pink wound bed without  slough.  Wound Description (Comments):   Present on Admission: -- (Unknown)     Pressure Injury 11/05/20 Buttocks Right Stage 1 -  Intact skin with non-blanchable redness of a localized area usually over a bony prominence. (Active)  11/05/20 1217  Location: Buttocks  Location Orientation: Right  Staging: Stage 1 -  Intact skin with non-blanchable redness of a localized area usually over a bony prominence.  Wound Description (Comments):   Present on Admission:      Pressure Injury 11/11/20 Thigh Right;Posterior deep tissue injury (Active)  11/11/20 2330  Location: Thigh  Location Orientation: Right;Posterior  Staging:   Wound Description (Comments): deep tissue injury  Present on Admission:   -Continue with local skin care. -Continue to have very poor mobility due to deconditioning. -1/5 PT/OT consult placed work with patient daily on range of motion and strengthening exercises  Positive Covid 19 infection COVID-19 Labs  Recent Labs    11/19/20 1010  DDIMER 1.01*  FERRITIN 445*  LDH 261*  CRP 0.8    Lab Results  Component Value Date   SARSCOV2NAA POSITIVE (A) 11/11/2020   SARSCOV2NAA POSITIVE (A) 11/11/2020   [place] NEGATIVE 11/04/2020   -S/p monoclonal antibody infusion -Continue isolation for 10 days if continued asymptomatic   DVT prophylaxis: Lovenox Code Status: Full Family Communication:  Status is: Inpatient    Dispo: The patient is from: Home              Anticipated d/c is to: Awaiting SNF placement              Anticipated d/c date is:??              Patient currently stable      Consultants:  Cardiology Nephrology  Procedures/Significant Events:    I have personally reviewed and interpreted all radiology studies and my findings are as above.  VENTILATOR SETTINGS:    Cultures   Antimicrobials: Anti-infectives (From admission, onward)   Start     Ordered Stop   11/10/20 0900  cefTRIAXone (ROCEPHIN) 2 g in sodium chloride  0.9 % 100 mL IVPB  Status:  Discontinued        11/10/20 0805 11/14/20 1152   11/10/20 0900  metroNIDAZOLE (FLAGYL) tablet 500 mg  Status:  Discontinued        11/10/20 0805 11/14/20 1152       Devices    LINES / TUBES:      Continuous Infusions:   Objective: Vitals:   11/18/20 2039 11/18/20 2339 11/19/20 0437 11/19/20 0729  BP: (!) 145/66 (!) 147/71 (!) 146/93 96/64  Pulse: 63 (!) 59 70 72  Resp: 18 18 20 19   Temp: 97.7 F (36.5 C) 98.7 F (37.1 C) 97.7 F (36.5 C) 98.2 F (36.8 C)  TempSrc:      SpO2: 98% 100% 98% 90%  Weight:      Height:        Intake/Output Summary (Last 24 hours) at 11/19/2020 0931 Last data filed at 11/19/2020 0446 Gross per 24 hour  Intake --  Output 100 ml  Net -100 ml   Filed Weights   11/04/20 1340  Weight: 78.7 kg    Examination:  General: A/O x1 (does not know where, when, why), pleasantly confused No acute respiratory distress Eyes: negative  scleral hemorrhage, negative anisocoria, negative icterus ENT: Negative Runny nose, negative gingival bleeding, Neck:  Negative scars, masses, torticollis, lymphadenopathy, JVD Lungs: Clear to auscultation bilaterally without wheezes or crackles Cardiovascular: Regular rate and rhythm without murmur gallop or rub normal S1 and S2 Abdomen: negative abdominal pain, nondistended, positive soft, bowel sounds, no rebound, no ascites, no appreciable mass Extremities: No significant cyanosis, clubbing, or edema bilateral lower extremities Skin: Negative rashes, lesions, ulcers Psychiatric: Significant dementia Central nervous system:  Cranial nerves II through XII intact, tongue/uvula midline, all extremities muscle strength 5/5, sensation intact throughout, negative dysarthria, negative expressive aphasia, negative receptive aphasia.  .     Data Reviewed: Care during the described time interval was provided by me .  I have reviewed this patient's available data, including medical history,  events of note, physical examination, and all test results as part of my evaluation.  CBC: Recent Labs  Lab 11/13/20 0558 11/15/20 0459  WBC 3.3* 6.1  NEUTROABS  --  4.0  HGB 11.0* 12.1  HCT 34.0* 36.7  MCV 93.2 91.1  PLT 164 161   Basic Metabolic Panel: Recent Labs  Lab 11/13/20 0558 11/15/20 0459 11/17/20 0657  NA 143 142 140  K 3.9 3.1* 3.9  CL 110 108 108  CO2 26 25 23   GLUCOSE 111* 92 101*  BUN 31* 26* 25*  CREATININE 1.14* 1.02* 0.86  CALCIUM 8.0* 8.7* 8.5*   GFR: Estimated Creatinine Clearance: 51.8 mL/min (by C-G formula based on SCr of 0.86 mg/dL). Liver Function Tests: Recent Labs  Lab 11/13/20 0558  AST 60*  ALT 28  ALKPHOS 20*  BILITOT 0.5  PROT 5.6*  ALBUMIN 2.4*   No results for input(s): LIPASE, AMYLASE in the last 168 hours. No results for input(s): AMMONIA in the last 168 hours. Coagulation Profile: No results for input(s): INR, PROTIME in the last 168 hours. Cardiac Enzymes: No results for input(s): CKTOTAL, CKMB, CKMBINDEX, TROPONINI in the last 168 hours. BNP (last 3 results) No results for input(s): PROBNP in the last 8760 hours. HbA1C: No results for input(s): HGBA1C in the last 72 hours. CBG: Recent Labs  Lab 11/18/20 2130 11/18/20 2340 11/19/20 0439 11/19/20 0525 11/19/20 0821  GLUCAP 80 89 41* 79 90   Lipid Profile: No results for input(s): CHOL, HDL, LDLCALC, TRIG, CHOLHDL, LDLDIRECT in the last 72 hours. Thyroid Function Tests: No results for input(s): TSH, T4TOTAL, FREET4, T3FREE, THYROIDAB in the last 72 hours. Anemia Panel: No results for input(s): VITAMINB12, FOLATE, FERRITIN, TIBC, IRON, RETICCTPCT in the last 72 hours. Sepsis Labs: No results for input(s): PROCALCITON, LATICACIDVEN in the last 168 hours.  Recent Results (from the past 240 hour(s))  Culture, blood (x 2)     Status: None   Collection Time: 11/10/20  2:17 AM   Specimen: BLOOD  Result Value Ref Range Status   Specimen Description BLOOD BLOOD  RIGHT HAND  Final   Special Requests   Final    BOTTLES DRAWN AEROBIC AND ANAEROBIC Blood Culture results may not be optimal due to an inadequate volume of blood received in culture bottles   Culture   Final    NO GROWTH 5 DAYS Performed at [facility], [address], [place], [place] [address]    Report Status 11/15/2020 FINAL  Final  Culture, blood (x 2)     Status: None   Collection Time: 11/10/20  2:18 AM   Specimen: BLOOD  Result Value Ref Range Status   Specimen Description BLOOD BLOOD  LEFT HAND  Final   Special Requests   Final    BOTTLES DRAWN AEROBIC AND ANAEROBIC Blood Culture adequate volume   Culture   Final    NO GROWTH 5 DAYS Performed at [facility], [address] [place], [place] [address]    Report Status 11/15/2020 FINAL  Final  Culture, Urine     Status: None   Collection Time: 11/10/20  4:49 AM   Specimen: Urine, Random  Result Value Ref Range Status   Specimen Description   Final    URINE, RANDOM Performed at [facility], [address] [place], [place] [address]    Special Requests   Final    NONE Performed at [facility], [address] [place], [place] [address]    Culture   Final    NO GROWTH Performed at [facility], [place] [address] [place], [place] [address]    Report Status 11/11/2020 FINAL  Final  Resp Panel by RT-PCR (Flu A&B, Covid) Nasopharyngeal Swab     Status: Abnormal   Collection Time: 11/11/20 12:30 PM   Specimen: Nasopharyngeal Swab; Nasopharyngeal(NP) swabs in vial transport medium  Result Value Ref Range Status   SARS Coronavirus 2 by RT PCR POSITIVE (A) NEGATIVE Final    Comment: RESULT CALLED TO, READ BACK BY AND VERIFIED WITH:  TIFFANY ROBINSON 1336 11/11/20 SDR (NOTE) SARS-CoV-2 target nucleic acids are DETECTED.  The SARS-CoV-2 RNA is generally detectable in upper respiratory specimens during the acute phase of infection. Positive results are indicative of the  presence of the identified virus, but do not rule out bacterial infection or co-infection with other pathogens not detected by the test. Clinical correlation with patient history and other diagnostic information is necessary to determine patient infection status. The expected result is Negative.  Fact Sheet for Patients: EntrepreneurPulse.com.au  Fact Sheet for Healthcare Providers: IncredibleEmployment.be  This test is not yet approved or cleared by the [place] FDA and  has been authorized for detection and/or diagnosis of SARS-CoV-2 by FDA under an Emergency Use Authorization (EUA).  This EUA will remain in effect (meaning this test can  be used) for the duration of  the COVID-19 declaration under Section 564(b)(1) of the Act, 21 U.S.C. section 360bbb-3(b)(1), unless the authorization is terminated or revoked sooner.     Influenza A by PCR NEGATIVE NEGATIVE Final   Influenza B by PCR NEGATIVE NEGATIVE Final    Comment: (NOTE) The Xpert Xpress SARS-CoV-2/FLU/RSV plus assay is intended as an aid in the diagnosis of influenza from Nasopharyngeal swab specimens and should not be used as a sole basis for treatment. Nasal washings and aspirates are unacceptable for Xpert Xpress SARS-CoV-2/FLU/RSV testing.  Fact Sheet for Patients: EntrepreneurPulse.com.au  Fact Sheet for Healthcare Providers: IncredibleEmployment.be  This test is not yet approved or cleared by the [place] FDA and has been authorized for detection and/or diagnosis of SARS-CoV-2 by FDA under an Emergency Use Authorization (EUA). This EUA will remain in effect (meaning this test can be used) for the duration of the COVID-19 declaration under Section 564(b)(1) of the Act, 21 U.S.C. section 360bbb-3(b)(1), unless the authorization is terminated or revoked.  Performed at [facility], [address] [place], [place]  [address]   Resp panel by RT-PCR (RSV, Flu A&B, Covid) Nasopharyngeal Swab     Status: Abnormal   Collection Time: 11/11/20  5:17 PM   Specimen: Nasopharyngeal Swab; Nasopharyngeal(NP) swabs in vial transport medium  Result Value  Ref Range Status   SARS Coronavirus 2 by RT PCR POSITIVE (A) NEGATIVE Final    Comment: RESULT CALLED TO, READ BACK BY AND VERIFIED WITH: STEPHANIE ROBINSON 11/11/20 1838 KLW (NOTE) SARS-CoV-2 target nucleic acids are DETECTED.  The SARS-CoV-2 RNA is generally detectable in upper respiratory specimens during the acute phase of infection. Positive results are indicative of the presence of the identified virus, but do not rule out bacterial infection or co-infection with other pathogens not detected by the test. Clinical correlation with patient history and other diagnostic information is necessary to determine patient infection status. The expected result is Negative.  Fact Sheet for Patients: EntrepreneurPulse.com.au  Fact Sheet for Healthcare Providers: IncredibleEmployment.be  This test is not yet approved or cleared by the [place] FDA and  has been authorized for detection and/or diagnosis of SARS-CoV-2 by FDA under an Emergency Use Authorization (EUA).  This EUA will remain in effect (meaning this test can  be used) for the duration of  the COVID-19 declaration under Section 564(b)(1) of the Act, 21 U.S.C. section 360bbb-3(b)(1), unless the authorization is terminated or revoked sooner.     Influenza A by PCR NEGATIVE NEGATIVE Final   Influenza B by PCR NEGATIVE NEGATIVE Final    Comment: (NOTE) The Xpert Xpress SARS-CoV-2/FLU/RSV plus assay is intended as an aid in the diagnosis of influenza from Nasopharyngeal swab specimens and should not be used as a sole basis for treatment. Nasal washings and aspirates are unacceptable for Xpert Xpress SARS-CoV-2/FLU/RSV testing.  Fact Sheet for  Patients: EntrepreneurPulse.com.au  Fact Sheet for Healthcare Providers: IncredibleEmployment.be  This test is not yet approved or cleared by the [place] FDA and has been authorized for detection and/or diagnosis of SARS-CoV-2 by FDA under an Emergency Use Authorization (EUA). This EUA will remain in effect (meaning this test can be used) for the duration of the COVID-19 declaration under Section 564(b)(1) of the Act, 21 U.S.C. section 360bbb-3(b)(1), unless the authorization is terminated or revoked.     Resp Syncytial Virus by PCR NEGATIVE NEGATIVE Final    Comment: (NOTE) Fact Sheet for Patients: EntrepreneurPulse.com.au  Fact Sheet for Healthcare Providers: IncredibleEmployment.be  This test is not yet approved or cleared by the [place] FDA and has been authorized for detection and/or diagnosis of SARS-CoV-2 by FDA under an Emergency Use Authorization (EUA). This EUA will remain in effect (meaning this test can be used) for the duration of the COVID-19 declaration under Section 564(b)(1) of the Act, 21 U.S.C. section 360bbb-3(b)(1), unless the authorization is terminated or revoked.  Performed at [facility], [address], [place], [place] [address]          Radiology Studies: No results found.      Scheduled Meds: . aspirin EC  81 mg Oral Daily  . Chlorhexidine Gluconate Cloth  6 each Topical Daily  . enoxaparin (LOVENOX) injection  40 mg Subcutaneous Q2200  . ferrous sulfate  325 mg Oral Daily  . glucose  1 tablet Oral Daily  . magnesium gluconate  250 mg Oral Daily  . melatonin  5 mg Oral QHS  . memantine  28 mg Oral QHS  . OLANZapine  5 mg Oral QHS  . rosuvastatin  10 mg Oral Daily   Continuous Infusions:   LOS: 14 days    Time spent:40 min    Mansour Balboa, Geraldo Docker, MD [facility] Pager [phone]  If 7PM-7AM, please contact  night-coverage [email or web address] Password [facility] 11/19/2020, 9:31 AM

## 2020-11-19 NOTE — Progress Notes (Signed)
Hypoglycemic Event  CBG: 41  Treatment: 4 oz juice/soda  Symptoms: None  Follow-up CBG: Time:0525   CBG Result:79  Possible Reasons for Event: Inadequate meal intake  Comments/MD notified: Oj given; followed up with thickened ensure; pt remained asyptomatic

## 2020-11-19 NOTE — Progress Notes (Signed)
Speech Language Pathology Treatment: Dysphagia  Patient Details Name: Alexis Rodriguez MRN: [id number] DOB: 03/20/1941 Today's Date: 11/19/2020 Time: 7121-9758 SLP Time Calculation (min) (ACUTE ONLY): 40 min  Assessment / Plan / Recommendation Clinical Impression  Pt seen for ongoing assessment of swallowing.She appearsto continue toimprove some each day though she is significantly deconditioned in the bed; Volume of speech low w/ reduced effort. 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 She stated she would like to have "water" andwas verbally responsive during trials of minced foods also but seemed to enjoy the Magic Cup ice cream moist. She was able to follow instructions w/ min-modcues. She accepted po trials but required encouragement. Pt is on RA now; wbc is wnl. Pt explained general aspiration precautions and agreed verbally to the need for following them especially sitting upright for all oral intake. Ptfullyassisted w/ positioning d/t weaknessbut was able to hold bedrails. She wasthen given Singletrials of ice chips,thinliquids by cup, and minced food trials then Magic Cup(50%). No immediate,overt clinical s/s of aspiration were noted w/ any trial; respiratory statusappeared toremain calm and unlabored, vocal quality clear b/t trialswhen assessed. Pt helped to hold Cup when drinking following instructions for single, small sips slowly. Min shaky UEs. NO straws were utiilized for better oral control.Oral phase appeared grossly [facility] for bolus management and timely A-P transfer for swallowing; oral clearing achieved w/ all consistencies.Oral tremors noted as well when attempting to take small sips of thin liquids via Cup.   Due to pt's Significant Weakness and Deconditioning overall and remaining moreso in the Bed, she is at  min increased risk for aspiration moreso w/ thin liquids especially if Not sitting Fully Upright when drinking.Recommend continue a Dysphagia level2diet (minced foods, purees) w/ gravies added to moisten foods for conservation of energy and d/t Missing Dentition;Thin liquids via CUP ONLY. Recommend aspiration precautions; PillsCrushedin Puree as needed while admitted (NSG to monitor if appropriate for upgrade to Whole in puree); tray setup and positioning assistance for meals and support w/ feeding d/t weakness.Recommend Single, Small Sips w/ all thin liquids - NO Straws.ST services will sign off at this time as pt appears to be able to tolerate this least restrictive diet during this time of illness and debilitation. Recommend Dietician f/u while admitted for support. NSG updated. Precautions posted at bedside.     HPI HPI: Pt is a 80 y.o. female with medical history significant of hypertension, hyperlipidemia, dementia, iron deficiency anemia, CKD-3, who presents with strokelike symptoms. Per her daughter, patient has strokelike symptoms for about 1 month. Symptoms have been going on intermittently, including confusion, blurry vision, slurred speech, slow response with staring off. No episode of fully loss of consciousness. No facial droop. Patient moves all extremities. Patient does not have chest pain, cough, shortness breath, fever or chills. No nausea, vomiting, diarrhea. MRI: "1. No acute intracranial abnormality. 2. Moderately advanced temporal lobe predominant cerebral atrophy  with chronic small vessel ischemic disease. MR of Abd.: included small to moderate-sized type 1 hiatal hernia.      SLP Plan  All goals met       Recommendations  Diet recommendations: Dysphagia 2 (fine chop);Thin liquid (add purees) Liquids provided via: Cup;No straw Medication Administration: Crushed with puree (for safer swallowing at this time; consider Whole post d/c) Supervision: Staff to assist with  self feeding;Full supervision/cueing for compensatory strategies Compensations: Minimize environmental distractions;Slow rate;Small  sips/bites;Lingual sweep for clearance of pocketing;Multiple dry swallows after each bite/sip;Follow solids with liquid Postural Changes and/or Swallow Maneuvers: Seated upright 90 degrees;Upright 30-60 min after meal                General recommendations:  (Dietician following) Oral Care Recommendations: Oral care BID;Oral care before and after PO;Staff/trained caregiver to provide oral care Follow up Recommendations: Skilled Nursing facility (TBD) SLP Visit Diagnosis: Dysphagia, oropharyngeal phase (R13.12) Plan: All goals met       GO                  [name], MS, CCC-SLP Speech Language Pathologist Rehab Services [phone] [facility] 11/19/2020, 5:57 PM

## 2020-11-20 DIAGNOSIS — N1831 Chronic kidney disease, stage 3a: Secondary | ICD-10-CM | POA: Diagnosis not present

## 2020-11-20 DIAGNOSIS — N179 Acute kidney failure, unspecified: Secondary | ICD-10-CM | POA: Diagnosis not present

## 2020-11-20 DIAGNOSIS — R69 Illness, unspecified: Secondary | ICD-10-CM | POA: Diagnosis not present

## 2020-11-20 DIAGNOSIS — N1832 Chronic kidney disease, stage 3b: Secondary | ICD-10-CM | POA: Diagnosis not present

## 2020-11-20 DIAGNOSIS — E162 Hypoglycemia, unspecified: Secondary | ICD-10-CM | POA: Diagnosis not present

## 2020-11-20 DIAGNOSIS — D509 Iron deficiency anemia, unspecified: Secondary | ICD-10-CM | POA: Diagnosis not present

## 2020-11-20 DIAGNOSIS — L89300 Pressure ulcer of unspecified buttock, unstageable: Secondary | ICD-10-CM | POA: Diagnosis not present

## 2020-11-20 DIAGNOSIS — I442 Atrioventricular block, complete: Secondary | ICD-10-CM | POA: Diagnosis not present

## 2020-11-20 DIAGNOSIS — F32A Depression, unspecified: Secondary | ICD-10-CM | POA: Diagnosis not present

## 2020-11-20 LAB — MAGNESIUM: Magnesium: 2 mg/dL (ref 1.7–2.4)

## 2020-11-20 LAB — CBC WITH DIFFERENTIAL/PLATELET
Abs Immature Granulocytes: 0.09 10*3/uL — ABNORMAL HIGH (ref 0.00–0.07)
Basophils Absolute: 0 10*3/uL (ref 0.0–0.1)
Basophils Relative: 1 %
Eosinophils Absolute: 0.3 10*3/uL (ref 0.0–0.5)
Eosinophils Relative: 4 %
HCT: 34 % — ABNORMAL LOW (ref 36.0–46.0)
Hemoglobin: 11.1 g/dL — ABNORMAL LOW (ref 12.0–15.0)
Immature Granulocytes: 1 %
Lymphocytes Relative: 24 %
Lymphs Abs: 1.8 10*3/uL (ref 0.7–4.0)
MCH: 29.9 pg (ref 26.0–34.0)
MCHC: 32.6 g/dL (ref 30.0–36.0)
MCV: 91.6 fL (ref 80.0–100.0)
Monocytes Absolute: 0.7 10*3/uL (ref 0.1–1.0)
Monocytes Relative: 9 %
Neutro Abs: 4.8 10*3/uL (ref 1.7–7.7)
Neutrophils Relative %: 61 %
Platelets: 386 10*3/uL (ref 150–400)
RBC: 3.71 MIL/uL — ABNORMAL LOW (ref 3.87–5.11)
RDW: 14.6 % (ref 11.5–15.5)
WBC: 7.8 10*3/uL (ref 4.0–10.5)
nRBC: 0 % (ref 0.0–0.2)

## 2020-11-20 LAB — SULFONYLUREA HYPOGLYCEMICS PANEL, SERUM
Acetohexamide: NEGATIVE ug/mL (ref 20–60)
Chlorpropamide: NEGATIVE ug/mL (ref 75–250)
Glimepiride: NEGATIVE ng/mL (ref 80–250)
Glipizide: NEGATIVE ng/mL (ref 200–1000)
Glyburide: NEGATIVE ng/mL
Nateglinide: NEGATIVE ng/mL
Repaglinide: NEGATIVE ng/mL
Tolazamide: NEGATIVE ug/mL
Tolbutamide: NEGATIVE ug/mL (ref 40–100)

## 2020-11-20 LAB — COMPREHENSIVE METABOLIC PANEL
ALT: 31 U/L (ref 0–44)
AST: 50 U/L — ABNORMAL HIGH (ref 15–41)
Albumin: 2.8 g/dL — ABNORMAL LOW (ref 3.5–5.0)
Alkaline Phosphatase: 31 U/L — ABNORMAL LOW (ref 38–126)
Anion gap: 10 (ref 5–15)
BUN: 23 mg/dL (ref 8–23)
CO2: 25 mmol/L (ref 22–32)
Calcium: 8.9 mg/dL (ref 8.9–10.3)
Chloride: 103 mmol/L (ref 98–111)
Creatinine, Ser: 0.84 mg/dL (ref 0.44–1.00)
GFR, Estimated: >60 mL/min (ref >60)
Glucose, Bld: 91 mg/dL (ref 70–99)
Potassium: 4 mmol/L (ref 3.5–5.1)
Sodium: 138 mmol/L (ref 135–145)
Total Bilirubin: 0.6 mg/dL (ref 0.3–1.2)
Total Protein: 6.6 g/dL (ref 6.5–8.1)

## 2020-11-20 LAB — C-REACTIVE PROTEIN: CRP: 0.8 mg/dL (ref <1.0)

## 2020-11-20 LAB — LACTATE DEHYDROGENASE: LDH: 214 U/L — ABNORMAL HIGH (ref 98–192)

## 2020-11-20 LAB — PHOSPHORUS: Phosphorus: 3.6 mg/dL (ref 2.5–4.6)

## 2020-11-20 LAB — GLUCOSE, CAPILLARY
Glucose-Capillary: 70 mg/dL (ref 70–99)
Glucose-Capillary: 95 mg/dL (ref 70–99)
Glucose-Capillary: 59 mg/dL — ABNORMAL LOW (ref 70–99)
Glucose-Capillary: 125 mg/dL — ABNORMAL HIGH (ref 70–99)
Glucose-Capillary: 131 mg/dL — ABNORMAL HIGH (ref 70–99)
Glucose-Capillary: 105 mg/dL — ABNORMAL HIGH (ref 70–99)
Glucose-Capillary: 92 mg/dL (ref 70–99)

## 2020-11-20 LAB — FERRITIN: Ferritin: 426 ng/mL — ABNORMAL HIGH (ref 11–307)

## 2020-11-20 LAB — D-DIMER, QUANTITATIVE: D-Dimer, Quant: 0.97 ug/mL-FEU — ABNORMAL HIGH (ref 0.00–0.50)

## 2020-11-20 MED ORDER — GLUCOSE 4 G PO CHEW
1.0000 | CHEWABLE_TABLET | Freq: Two times a day (BID) | ORAL | Status: DC
  Administered 2020-11-20 – 2020-11-22 (×4): 4 g via ORAL
  Filled 2020-11-20 (×3): qty 1

## 2020-11-20 MED ORDER — DEXTROSE 50 % IV SOLN
INTRAVENOUS | Status: AC
  Filled 2020-11-20: qty 50

## 2020-11-20 NOTE — Progress Notes (Signed)
Hypoglycemic Event  CBG: 70 at 0536  Treatment: 4oz of orange juice  Symptoms: asymptomatic  Follow-up CBG: Time: 0617 CBG Result:95  Possible Reasons for Event: decreased PO intake  Comments/MD notified:    Oris Drone

## 2020-11-20 NOTE — Progress Notes (Cosign Needed Addendum)
Re: Alexis Rodriguez Date of Birth: 1941/10/07 Date: 11/20/2020   To Whom It May Concern:  Please be advised that the above-name patient's dementia diagnosis is primary and supersedes his mental illness.

## 2020-11-20 NOTE — NC FL2 (Signed)
[place] LEVEL OF CARE SCREENING TOOL     IDENTIFICATION  Patient Name: Alexis Rodriguez Birthdate: May 08, 1941 Sex: female Admission Date (Current Location): 11/04/2020  [place] and [place] Number:  Engineering geologist and Address:  [facility], [address], [place], [place] [address]      Provider Number: [id number]  Attending Physician Name and Address:  Tawni Millers,*  Relative Name and Phone Number:  Mariabelen Pressly daughter/POA - [phone]    Current Level of Care: Hospital Recommended Level of Care: [place] Prior Approval Number:    Date Approved/Denied:   PASRR Number: pending  Discharge Plan: SNF    Current Diagnoses: Patient Active Problem List   Diagnosis Date Noted  . Heart block   . Third degree AV block ([place])   . AKI (acute kidney injury) ([place])   . Pressure injury of skin 11/06/2020  . Stroke-like symptoms 11/04/2020  . Depression 11/04/2020  . HTN (hypertension)   . Hypoglycemia   . Dementia ([place])   . HLD (hyperlipidemia)   . Iron deficiency anemia   . CKD (chronic kidney disease), stage IIIa     Orientation RESPIRATION BLADDER Height & Weight     Self  Normal Incontinent,External catheter Weight: 173 lb 9.6 oz (78.7 kg) Height:  5\' 3"  (160 cm)  BEHAVIORAL SYMPTOMS/MOOD NEUROLOGICAL BOWEL NUTRITION STATUS      Incontinent Diet (dys 2, thin liquids, needs assistance)  AMBULATORY STATUS COMMUNICATION OF NEEDS Skin   Extensive Assist Verbally  (pressure injury- L & R buttocks, thigh. Moisture associated skin damage- buttocks L & R)                       Personal Care Assistance Level of Assistance  Bathing,Feeding,Dressing Bathing Assistance: Maximum assistance Feeding assistance: Limited assistance Dressing Assistance: Maximum assistance     Functional Limitations Info             SPECIAL CARE FACTORS FREQUENCY  PT (By licensed PT),OT (By licensed OT)     PT  Frequency: 5 x/week OT Frequency: 5 x/week            Contractures Contractures Info: Not present    Additional Factors Info  Code Status,Allergies Code Status Info: full code Allergies Info: donepezil, shellfish, penicillins           Current Medications (11/20/2020):  This is the current hospital active medication list Current Facility-Administered Medications  Medication Dose Route Frequency Provider Last Rate Last Admin  . dextrose 50 % solution           . acetaminophen (TYLENOL) tablet 650 mg  650 mg Oral Q4H PRN Ivor Costa, MD   650 mg at 11/13/20 0451  . ascorbic acid (VITAMIN C) tablet 500 mg  500 mg Oral Daily Allie Bossier, MD   500 mg at 11/20/20 0912  . aspirin EC tablet 81 mg  81 mg Oral Daily Ivor Costa, MD   81 mg at 11/20/20 7628  . Chlorhexidine Gluconate Cloth 2 % PADS 6 each  6 each Topical Daily Lorella Nimrod, MD   6 each at 11/20/20 1227  . enoxaparin (LOVENOX) injection 40 mg  40 mg Subcutaneous Q2200 Dorothe Pea, RPH   40 mg at 11/19/20 2026  . ferrous sulfate tablet 325 mg  325 mg Oral Daily Ivor Costa, MD   325 mg at 11/20/20 0906  . glucose chewable tablet 4 g  1 tablet Oral  BID Arrien, Jimmy Picket, MD      . magnesium gluconate (MAGONATE) tablet 250 mg  250 mg Oral Daily Benita Gutter, RPH   250 mg at 11/20/20 0919  . melatonin tablet 5 mg  5 mg Oral QHS Ivor Costa, MD   5 mg at 11/19/20 2026  . memantine (NAMENDA XR) 24 hr capsule 28 mg  28 mg Oral QHS Benita Gutter, RPH   28 mg at 11/19/20 2026  . OLANZapine (ZYPREXA) tablet 5 mg  5 mg Oral QHS Ivor Costa, MD   5 mg at 11/19/20 2026  . ondansetron (ZOFRAN) injection 4 mg  4 mg Intravenous Q8H PRN Ivor Costa, MD      . rosuvastatin (CRESTOR) tablet 10 mg  10 mg Oral Daily Lorella Nimrod, MD   10 mg at 11/20/20 6244     Discharge Medications: Please see discharge summary for a list of discharge medications.  Relevant Imaging Results:  Relevant Lab Results:   Additional  Information SS #: [id number]  [place], LCSW

## 2020-11-20 NOTE — Evaluation (Signed)
Physical Therapy Evaluation Patient Details Name: Alexis Rodriguez MRN: [id number] DOB: October 11, 1941 Today's Date: 11/20/2020   History of Present Illness  presented to ER secondary to AMS, periods of unresponsiveness, questionable seizure-like activity; admitted for TIA/CVA work up.  MRI negative for acute insult.  Clinical Impression  Patient with extended hospitalization, complicated by positive COVID diagnosis, AKI, hyponatremia.  Continues with very flat affect, but agreeable to participate with session with min encouragement from therapist.  Very delayed processing and task initiation, but does follow simple commands (optimized with hand-over-hand or therapist demonstration of task).  Generally weak and deconditioned throughout all extremities; no focal weakness appreciated.  Currently requiring mod/max assist for bed mobility; mod assist for sit/stand and static standing balance. Heavy posterior lean/weight shift; very high risk for falling. Unsafe to attempt without RW and extensive +1 assist at all times.  Overall, requiring increased physical assist than on initial evaluation during this hospital stay; however, goals initially established remain appropriate for patient at this time. Would benefit from skilled PT to address above deficits and promote optimal return to PLOF.; recommend transition to STR upon discharge from acute hospitalization.     Follow Up Recommendations SNF    Equipment Recommendations  Rolling walker with 5" wheels;3in1 (PT)    Recommendations for Other Services       Precautions / Restrictions Precautions Precautions: Fall Restrictions Weight Bearing Restrictions: No      Mobility  Bed Mobility Overal bed mobility: Needs Assistance Bed Mobility: Supine to Sit;Sit to Supine Rolling: Mod assist   Supine to sit: Max assist          Transfers Overall transfer level: Needs assistance Equipment used: Rolling walker (2 wheeled) Transfers: Sit to/from  Stand Sit to Stand: Mod assist         General transfer comment: heavy physical assist for lift off, standing balance; posterior lean, poor balance.  Very fearful/anxious, requesting return to seated position due to fatigue  Ambulation/Gait             General Gait Details: unsafe/unable  Stairs            Wheelchair Mobility    Modified Rankin (Stroke Patients Only)       Balance Overall balance assessment: Needs assistance Sitting-balance support: No upper extremity supported;Feet supported Sitting balance-Leahy Scale: Good     Standing balance support: Bilateral upper extremity supported Standing balance-Leahy Scale: Poor                               Pertinent Vitals/Pain Pain Assessment: Faces Faces Pain Scale: No hurt    Home Living Family/patient expects to be discharged to:: Private residence Living Arrangements: Children Available Help at Discharge: Family Type of Home: House Home Access: Level entry     Home Layout: One level Home Equipment: Environmental consultant - 2 wheels Additional Comments: Pt lives with daughter at home. Unclear how frequent pt is home alone.    Prior Function           Comments: Patient limited historian; will verify with family as available.  Patient indicates ambulatory with RW for household distances; denies recent fall history.     Hand Dominance        Extremity/Trunk Assessment   Upper Extremity Assessment Upper Extremity Assessment: Generalized weakness (grossly 4-/5 throughout; generally tremulous)    Lower Extremity Assessment Lower Extremity Assessment: Generalized weakness (grossly 4-/5 throughout)  Communication      Cognition Arousal/Alertness: Awake/alert Behavior During Therapy: Flat affect Overall Cognitive Status: No family/caregiver present to determine baseline cognitive functioning                                 General Comments: oriented to self only; follows  sipmle commands, but often requires therapist demonstation for task initiation      General Comments      Exercises Other Exercises Other Exercises: Grooming tasks edge of bed (washing face, brushing hair), mod assist to complete. Maintains static sitting balance with close sup; limited ability/willingness to move outside immediate BOS   Assessment/Plan    PT Assessment Patient needs continued PT services  PT Problem List Decreased strength;Decreased activity tolerance;Decreased balance;Decreased mobility;Decreased coordination;Decreased cognition;Decreased knowledge of use of DME;Decreased safety awareness;Decreased knowledge of precautions       PT Treatment Interventions DME instruction;Gait training;Functional mobility training;Therapeutic activities;Therapeutic exercise;Balance training;Neuromuscular re-education;Patient/family education;Cognitive remediation    PT Goals (Current goals can be found in the Care Plan section)  Acute Rehab PT Goals Patient Stated Goal: to get stronger PT Goal Formulation: Patient unable to participate in goal setting Time For Goal Achievement: 12/04/20 Potential to Achieve Goals: Fair    Frequency Min 2X/week   Barriers to discharge        Co-evaluation               AM-PAC PT "6 Clicks" Mobility  Outcome Measure Help needed turning from your back to your side while in a flat bed without using bedrails?: A Lot Help needed moving from lying on your back to sitting on the side of a flat bed without using bedrails?: A Lot Help needed moving to and from a bed to a chair (including a wheelchair)?: A Lot Help needed standing up from a chair using your arms (e.g., wheelchair or bedside chair)?: A Lot Help needed to walk in hospital room?: Total Help needed climbing 3-5 steps with a railing? : Total 6 Click Score: 10    End of Session   Activity Tolerance: Patient tolerated treatment well Patient left: in bed;with call bell/phone within  reach;with bed alarm set Nurse Communication: Mobility status PT Visit Diagnosis: Muscle weakness (generalized) (M62.81);Difficulty in walking, not elsewhere classified (R26.2)    Time: 1610-9604 PT Time Calculation (min) (ACUTE ONLY): 27 min   Charges:   PT Evaluation $PT Re-evaluation: 1 Re-eval PT Treatments $Therapeutic Activity: 8-22 mins        Juanita Devincent H. Owens Shark, PT, DPT, NCS 11/20/20, 11:46 AM [phone]

## 2020-11-20 NOTE — Progress Notes (Signed)
PROGRESS NOTE    Alexis Rodriguez  CLE:[id number] DOB: [birth year] DOA: 11/04/2020 PCP: Perrin Maltese, MD    Brief Narrative:  Alexis Rodriguez was admitted to the hospital with working diagnosis of symptomatic hypoglycemia in the setting of SARS COVID-19 viral infection.  80 year old female past medical history of hypertension, dyslipidemia, dementia, iron deficiency anemia, chronic kidney disease stage III who presented with strokelike symptoms for about 1 month. Intermittent confusion, blurry vision, slurred speech and slow to response. No loss of consciousness. In the emergency department her glucose was down to 39. Blood pressure 140/60, heart rate 68, respirate 15, oxygen saturation 96%, her lungs are clear to auscultation bilaterally, heart S1-S2, present rhythmic, soft abdomen, no lower extremity edema, she was confused and disoriented, not agitated.  Further work-up with brain MRI showed no acute infarct. EEG negative for seizures. Patient received intravenous D10, glucagon and octreotide for persistent hypoglycemia. Octreotide was discontinued due to bradycardia. COVID-19 PCR December 28 turn positive, patient received monoclonal [id number] no complications.  Patient tolerating po well.   Now pending transfer to SNF.   Assessment & Plan:   Principal Problem:   Hypoglycemia Active Problems:   HTN (hypertension)   Dementia (HCC)   HLD (hyperlipidemia)   Iron deficiency anemia   CKD (chronic kidney disease), stage IIIa   Depression   Pressure injury of skin   Third degree AV block (HCC)   AKI (acute kidney injury) ([place])    1. Symptomatic hypoglycemia, ruled out for CVA.patient continue with poor oral intake. Capillary glucose this am 70-95-59-125-131.   Increase glucose supplementation per mouth 4 g to bid. Cognitive impairment limits her po intake.   Tolerating well dysphagia 2 per speech recommendations.   Plan for  transfer to SNF.  2. AKI on CKD  stage 3a/ hypoantremia/ hypokalemia. Complicated with urinary retention. Patient had foley cathter placed, she has failed voiding trial on 12/30.  Will do voiding trial today, monitor urine output.    3. HTN/ dyslipidemia.Blood pressure stable off antihypertensive medications, continue with rosuastatin  4. Depression and dementia/ swallow dysfunction. dysphagia 2 diet, Pneumonia ruled out.   Continue with memantine and olanzapine.  5. Anemia with iron deficiency.Oniron supplementation.   6. Bradycardia with third degree AV block. ekg Hr 47, normal axis, right bundle brunch block, normal Qtc, sinus, with no st segment or t wave changes. Now off telemetry  7. Stage 2 left buttock, stage 1 right buttock and right posterior thigh pressure ulcer (not able to stage), present on admission.Continue with local skin care. Plan SNF  8. Positive COVID 19 infection.Sp monoclonal antibody infusion with good toleration, continue isolation for 10 days if continue asymptomatic.  Discontinue isolation on 11/21/20. Continue asymptomatic    Status is: Inpatient  Remains inpatient appropriate because:Unsafe d/c plan   Dispo: The patient is from: Home              Anticipated d/c is to: SNF              Anticipated d/c date is: 1 day              Patient currently is medically stable to d/c.   DVT prophylaxis:  enoxaparin   Code Status:   full  Family Communication:  No family at the bedside      Nutrition Status: Nutrition Problem: Increased nutrient needs Etiology: catabolic illness ([id number]) Signs/Symptoms: estimated needs Interventions: Refer to RD note for recommendations     Skin Documentation: Pressure Injury 11/05/20  Buttocks Left Stage 2 -  Partial thickness loss of dermis presenting as a shallow open injury with a red, pink wound bed without slough. (Active)  11/05/20 1200  Location: Buttocks  Location Orientation: Left  Staging: Stage 2 -  Partial  thickness loss of dermis presenting as a shallow open injury with a red, pink wound bed without slough.  Wound Description (Comments):   Present on Admission:      Pressure Injury 11/05/20 Buttocks Right Stage 1 -  Intact skin with non-blanchable redness of a localized area usually over a bony prominence. (Active)  11/05/20 1217  Location: Buttocks  Location Orientation: Right  Staging: Stage 1 -  Intact skin with non-blanchable redness of a localized area usually over a bony prominence.  Wound Description (Comments):   Present on Admission:      Pressure Injury 11/11/20 Thigh Right;Posterior deep tissue injury (Active)  11/11/20 2330  Location: Thigh  Location Orientation: Right;Posterior  Staging:   Wound Description (Comments): deep tissue injury  Present on Admission:          Subjective: Patient is feeling better, continue to have poor oral intake, denies any dyspnea, cough or chest pain,   Objective: Vitals:   11/20/20 0041 11/20/20 0405 11/20/20 0833 11/20/20 1224  BP: (!) 97/53 (!) 97/50 132/61 (!) 119/57  Pulse: 75 70 70 72  Resp: 18 17 18 18   Temp: 98.6 F (37 C) 98 F (36.7 C) 97.8 F (36.6 C) 97.8 F (36.6 C)  TempSrc:      SpO2: 95% 96% 98% 94%  Weight:      Height:        Intake/Output Summary (Last 24 hours) at 11/20/2020 1332 Last data filed at 11/20/2020 0548 Gross per 24 hour  Intake 475 ml  Output 1150 ml  Net -675 ml   Filed Weights   11/04/20 1340  Weight: 78.7 kg    Examination:   General: Not in pain or dyspnea, deconditioned  Neurology: Awake and alert, non focal. Not very communicative E ENT: no pallor, no icterus, oral mucosa moist Cardiovascular: No JVD. S1-S2 present, rhythmic, no gallops, rubs, or murmurs. No lower extremity edema. Pulmonary:  Positive  breath sounds bilaterally, adequate air movement, no wheezing, rhonchi or rales. Gastrointestinal. Abdomen soft and non tender Skin. No rashes Musculoskeletal: no joint  deformities     Data Reviewed: I have personally reviewed following labs and imaging studies  CBC: Recent Labs  Lab 11/15/20 0459 11/20/20 0631  WBC 6.1 7.8  NEUTROABS 4.0 4.8  HGB 12.1 11.1*  HCT 36.7 34.0*  MCV 91.1 91.6  PLT 218 350   Basic Metabolic Panel: Recent Labs  Lab 11/15/20 0459 11/17/20 0657 11/20/20 0631  NA 142 140 138  K 3.1* 3.9 4.0  CL 108 108 103  CO2 25 23 25   GLUCOSE 92 101* 91  BUN 26* 25* 23  CREATININE 1.02* 0.86 0.84  CALCIUM 8.7* 8.5* 8.9  MG  --   --  2.0  PHOS  --   --  3.6   GFR: Estimated Creatinine Clearance: 53 mL/min (by C-G formula based on SCr of 0.84 mg/dL). Liver Function Tests: Recent Labs  Lab 11/20/20 0631  AST 50*  ALT 31  ALKPHOS 31*  BILITOT 0.6  PROT 6.6  ALBUMIN 2.8*   No results for input(s): LIPASE, AMYLASE in the last 168 hours. No results for input(s): AMMONIA in the last 168 hours. Coagulation Profile: No results for input(s): INR, PROTIME in  the last 168 hours. Cardiac Enzymes: No results for input(s): CKTOTAL, CKMB, CKMBINDEX, TROPONINI in the last 168 hours. BNP (last 3 results) No results for input(s): PROBNP in the last 8760 hours. HbA1C: No results for input(s): HGBA1C in the last 72 hours. CBG: Recent Labs  Lab 11/20/20 0536 11/20/20 0617 11/20/20 0831 11/20/20 1009 11/20/20 1226  GLUCAP 70 95 59* 125* 131*   Lipid Profile: No results for input(s): CHOL, HDL, LDLCALC, TRIG, CHOLHDL, LDLDIRECT in the last 72 hours. Thyroid Function Tests: No results for input(s): TSH, T4TOTAL, FREET4, T3FREE, THYROIDAB in the last 72 hours. Anemia Panel: Recent Labs    11/19/20 1010 11/20/20 0631  FERRITIN 445* 426*      Radiology Studies: I have reviewed all of the imaging during this hospital visit personally     Scheduled Meds: . dextrose      . vitamin C  500 mg Oral Daily  . aspirin EC  81 mg Oral Daily  . Chlorhexidine Gluconate Cloth  6 each Topical Daily  . enoxaparin (LOVENOX)  injection  40 mg Subcutaneous Q2200  . ferrous sulfate  325 mg Oral Daily  . glucose  1 tablet Oral BID  . magnesium gluconate  250 mg Oral Daily  . melatonin  5 mg Oral QHS  . memantine  28 mg Oral QHS  . OLANZapine  5 mg Oral QHS  . rosuvastatin  10 mg Oral Daily   Continuous Infusions:   LOS: 15 days        Marthena Whitmyer Gerome Apley, MD

## 2020-11-20 NOTE — Plan of Care (Signed)
  Problem: Education: Goal: Knowledge of General Education information will improve Description: Including pain rating scale, medication(s)/side effects and non-pharmacologic comfort measures Outcome: Progressing   Problem: Health Behavior/Discharge Planning: Goal: Ability to manage health-related needs will improve Outcome: Progressing   Problem: Clinical Measurements: Goal: Ability to maintain clinical measurements within normal limits will improve Outcome: Progressing Goal: Will remain free from infection Outcome: Progressing Goal: Diagnostic test results will improve Outcome: Progressing Goal: Respiratory complications will improve Outcome: Progressing Goal: Cardiovascular complication will be avoided Outcome: Progressing   Problem: Activity: Goal: Risk for activity intolerance will decrease Outcome: Progressing   Problem: Nutrition: Goal: Adequate nutrition will be maintained Outcome: Progressing   Problem: Coping: Goal: Level of anxiety will decrease Outcome: Progressing   Problem: Elimination: Goal: Will not experience complications related to bowel motility Outcome: Progressing Goal: Will not experience complications related to urinary retention Outcome: Progressing   Problem: Pain Managment: Goal: General experience of comfort will improve Outcome: Progressing   Problem: Safety: Goal: Ability to remain free from injury will improve Outcome: Progressing   Problem: Skin Integrity: Goal: Risk for impaired skin integrity will decrease Outcome: Progressing   Problem: Education: Goal: Knowledge of secondary prevention will improve Outcome: Progressing Goal: Knowledge of patient specific risk factors addressed and post discharge goals established will improve Outcome: Progressing Goal: Individualized Educational Video(s) Outcome: Progressing   Problem: Self-Care: Goal: Verbalization of feelings and concerns over difficulty with self-care will  improve Outcome: Progressing Goal: Ability to communicate needs accurately will improve Outcome: Progressing   Problem: Nutrition: Goal: Dietary intake will improve Outcome: Progressing

## 2020-11-20 NOTE — TOC Progression Note (Addendum)
Transition of Care [facility]) - Progression Note    Patient Details  Name: Alexis Rodriguez MRN: [id number] Date of Birth: 1941/06/08  [facility]) CM/SW [place], LCSW Phone Number: 11/20/2020, 10:38 AM  Clinical Narrative:      CSW informed by MD yesterday patient is not medically ready for SNF. CSW asked Attending today on status. Patient's 10 day post COVID would be tomorrow 1/7. If medically ready, can try for SNF at that point. MD today said patient is medically ready for SNF and is not having COVID symptoms.   Asked PT to see patient today if possible.  CSW called patient's daughter, she is agreeable to CSW re-sending patient's information out to SNFs in the area. Explained that some SNFs are taking patients 10 days post COVID ([facility]). She says she would prefer [facility] over [facility] based on Medicare.gov ratings. CSW completed SNF work up. Called Chris at [facility] who is checking to see if they can accept patient.   3:15- [facility] accepted patient. Chris from [facility] reported he will start insurance authorization.    Expected Discharge Plan: [place] Barriers to Discharge: Continued Medical Work up  Expected Discharge Plan and Services Expected Discharge Plan: [place] Choice: [place] arrangements for the past 2 months: Single Family Home                 DME Arranged: 3-N-1 DME Agency: [employer] Date DME Agency Contacted: 11/05/20   Representative spoke with at DME Agency: [place]: PT,OT           Social Determinants of Health (SDOH) Interventions    Readmission Risk Interventions No flowsheet data found.

## 2020-11-20 NOTE — Care Management Important Message (Signed)
Important Message  Patient Details  Name: Alexis Rodriguez MRN: [id number] Date of Birth: Dec 26, 1940   Medicare Important Message Given:  Yes  Left for RN to deliver due to patient's isolation status.   [place], LCSW 11/20/2020, 2:19 PM

## 2020-11-21 DIAGNOSIS — E7800 Pure hypercholesterolemia, unspecified: Secondary | ICD-10-CM | POA: Diagnosis not present

## 2020-11-21 DIAGNOSIS — D500 Iron deficiency anemia secondary to blood loss (chronic): Secondary | ICD-10-CM | POA: Diagnosis not present

## 2020-11-21 DIAGNOSIS — N1832 Chronic kidney disease, stage 3b: Secondary | ICD-10-CM | POA: Diagnosis not present

## 2020-11-21 DIAGNOSIS — D508 Other iron deficiency anemias: Secondary | ICD-10-CM | POA: Diagnosis not present

## 2020-11-21 DIAGNOSIS — N179 Acute kidney failure, unspecified: Secondary | ICD-10-CM | POA: Diagnosis not present

## 2020-11-21 DIAGNOSIS — R69 Illness, unspecified: Secondary | ICD-10-CM | POA: Diagnosis not present

## 2020-11-21 DIAGNOSIS — I10 Essential (primary) hypertension: Secondary | ICD-10-CM | POA: Diagnosis not present

## 2020-11-21 DIAGNOSIS — R4182 Altered mental status, unspecified: Secondary | ICD-10-CM | POA: Diagnosis not present

## 2020-11-21 DIAGNOSIS — E162 Hypoglycemia, unspecified: Secondary | ICD-10-CM | POA: Diagnosis not present

## 2020-11-21 LAB — GLUCOSE, CAPILLARY
Glucose-Capillary: 96 mg/dL (ref 70–99)
Glucose-Capillary: 67 mg/dL — ABNORMAL LOW (ref 70–99)
Glucose-Capillary: 91 mg/dL (ref 70–99)
Glucose-Capillary: 113 mg/dL — ABNORMAL HIGH (ref 70–99)
Glucose-Capillary: 65 mg/dL — ABNORMAL LOW (ref 70–99)
Glucose-Capillary: 73 mg/dL (ref 70–99)
Glucose-Capillary: 61 mg/dL — ABNORMAL LOW (ref 70–99)
Glucose-Capillary: 66 mg/dL — ABNORMAL LOW (ref 70–99)
Glucose-Capillary: 85 mg/dL (ref 70–99)
Glucose-Capillary: 110 mg/dL — ABNORMAL HIGH (ref 70–99)

## 2020-11-21 LAB — CBC WITH DIFFERENTIAL/PLATELET
Abs Immature Granulocytes: 0.05 10*3/uL (ref 0.00–0.07)
Basophils Absolute: 0.1 10*3/uL (ref 0.0–0.1)
Basophils Relative: 1 %
Eosinophils Absolute: 0.2 10*3/uL (ref 0.0–0.5)
Eosinophils Relative: 3 %
HCT: 35.8 % — ABNORMAL LOW (ref 36.0–46.0)
Hemoglobin: 11.6 g/dL — ABNORMAL LOW (ref 12.0–15.0)
Immature Granulocytes: 1 %
Lymphocytes Relative: 27 %
Lymphs Abs: 2.1 10*3/uL (ref 0.7–4.0)
MCH: 30.1 pg (ref 26.0–34.0)
MCHC: 32.4 g/dL (ref 30.0–36.0)
MCV: 92.7 fL (ref 80.0–100.0)
Monocytes Absolute: 0.6 10*3/uL (ref 0.1–1.0)
Monocytes Relative: 8 %
Neutro Abs: 4.8 10*3/uL (ref 1.7–7.7)
Neutrophils Relative %: 60 %
Platelets: 398 10*3/uL (ref 150–400)
RBC: 3.86 MIL/uL — ABNORMAL LOW (ref 3.87–5.11)
RDW: 14.7 % (ref 11.5–15.5)
WBC: 7.8 10*3/uL (ref 4.0–10.5)
nRBC: 0 % (ref 0.0–0.2)

## 2020-11-21 LAB — COMPREHENSIVE METABOLIC PANEL
ALT: 33 U/L (ref 0–44)
AST: 57 U/L — ABNORMAL HIGH (ref 15–41)
Albumin: 3 g/dL — ABNORMAL LOW (ref 3.5–5.0)
Alkaline Phosphatase: 36 U/L — ABNORMAL LOW (ref 38–126)
Anion gap: 8 (ref 5–15)
BUN: 20 mg/dL (ref 8–23)
CO2: 26 mmol/L (ref 22–32)
Calcium: 8.9 mg/dL (ref 8.9–10.3)
Chloride: 103 mmol/L (ref 98–111)
Creatinine, Ser: 0.82 mg/dL (ref 0.44–1.00)
GFR, Estimated: >60 mL/min (ref >60)
Glucose, Bld: 76 mg/dL (ref 70–99)
Potassium: 4.1 mmol/L (ref 3.5–5.1)
Sodium: 137 mmol/L (ref 135–145)
Total Bilirubin: 0.7 mg/dL (ref 0.3–1.2)
Total Protein: 7 g/dL (ref 6.5–8.1)

## 2020-11-21 LAB — BASIC METABOLIC PANEL
Anion gap: 8 (ref 5–15)
BUN: 21 mg/dL (ref 8–23)
CO2: 25 mmol/L (ref 22–32)
Calcium: 8.7 mg/dL — ABNORMAL LOW (ref 8.9–10.3)
Chloride: 106 mmol/L (ref 98–111)
Creatinine, Ser: 0.9 mg/dL (ref 0.44–1.00)
GFR, Estimated: >60 mL/min (ref >60)
Glucose, Bld: 67 mg/dL — ABNORMAL LOW (ref 70–99)
Potassium: 3.7 mmol/L (ref 3.5–5.1)
Sodium: 139 mmol/L (ref 135–145)

## 2020-11-21 LAB — PHOSPHORUS: Phosphorus: 4 mg/dL (ref 2.5–4.6)

## 2020-11-21 LAB — LACTATE DEHYDROGENASE: LDH: 214 U/L — ABNORMAL HIGH (ref 98–192)

## 2020-11-21 LAB — MAGNESIUM: Magnesium: 2.2 mg/dL (ref 1.7–2.4)

## 2020-11-21 LAB — C-REACTIVE PROTEIN: CRP: 0.7 mg/dL (ref <1.0)

## 2020-11-21 LAB — FERRITIN: Ferritin: 452 ng/mL — ABNORMAL HIGH (ref 11–307)

## 2020-11-21 MED ORDER — TAMSULOSIN HCL 0.4 MG PO CAPS
0.4000 mg | ORAL_CAPSULE | Freq: Every day | ORAL | Status: DC
  Administered 2020-11-21 – 2020-11-22 (×2): 0.4 mg via ORAL
  Filled 2020-11-21 (×2): qty 1

## 2020-11-21 MED ORDER — ENSURE ENLIVE PO LIQD
237.0000 mL | Freq: Three times a day (TID) | ORAL | Status: DC
  Administered 2020-11-21 – 2020-11-22 (×3): 237 mL via ORAL

## 2020-11-21 MED ORDER — ADULT MULTIVITAMIN W/MINERALS CH
1.0000 | ORAL_TABLET | Freq: Every day | ORAL | Status: DC
  Administered 2020-11-22: 09:00:00 1 via ORAL
  Filled 2020-11-21: qty 1

## 2020-11-21 NOTE — Progress Notes (Cosign Needed)
To Whom It May Concern:  Please be advised that the above-name patient's dementia diagnosis is primary and supersedes his mental illness.

## 2020-11-21 NOTE — TOC Progression Note (Signed)
Transition of Care [facility]) - Progression Note    Patient Details  Name: Alexis Rodriguez MRN: [id number] Date of Birth: [birth year]  Transition of Care [facility]) CM/SW Contact  Shelbie Hutching, RN Phone Number: 11/21/2020, 4:32 PM  Clinical Narrative:    [place] is waiving insurance authorization. [facility] can accept patient and will plan for discharge to [facility] tomorrow.  Patient's daughter updated on plan of care for discharge tomorrow.    Expected Discharge Plan: [place] Barriers to Discharge: Continued Medical Work up  Expected Discharge Plan and Services Expected Discharge Plan: [place] Choice: [place] arrangements for the past 2 months: Single Family Home                 DME Arranged: 3-N-1 DME Agency: [facility] Date DME Agency Contacted: 11/05/20   Representative spoke with at DME Agency: [place]: PT,OT           Social Determinants of Health (SDOH) Interventions    Readmission Risk Interventions No flowsheet data found.

## 2020-11-21 NOTE — Plan of Care (Addendum)
Pt alert and oriented x2,denied pain, nausea, SOB. Incontinent of bowel. Foley removed per order. External cath placed. Wound dressing changed per order, new PIV installed.  0550: BG 60. 120cc orange juice given.  0615: BG recheck 91 Problem: Pain Managment: Goal: General experience of comfort will improve Outcome: Progressing   Problem: Safety: Goal: Ability to remain free from injury will improve Outcome: Progressing

## 2020-11-21 NOTE — Progress Notes (Signed)
Foley removed this am at 0430.  Patient did not void yet.  Bladder scan results showed 691ml.  Dr. Sherral Hammers notified and requested to insert a foley.

## 2020-11-21 NOTE — Progress Notes (Signed)
PROGRESS NOTE    Alexis Rodriguez  GQB:[id number] DOB: [birth year] DOA: 11/04/2020 PCP: Perrin Maltese, MD     Brief Narrative:  Mrs. Grenier was admitted to the hospital with working diagnosis of symptomatic hypoglycemia in the setting of SARS COVID-19 viral infection.  80 year old WF PMHx Dementia, HTN, dyslipidemia, iron deficiency anemia, CKD stage III   Presented with strokelike symptoms for about 1 month. Intermittent confusion, blurry vision, slurred speech and slow to response. No loss of consciousness. In the emergency department her glucose was down to 39. Blood pressure 140/60, heart rate 68, respirate 15, oxygen saturation 96%, her lungs are clear to auscultation bilaterally, heart S1-S2, present rhythmic, soft abdomen, no lower extremity edema, she was confused and disoriented, not agitated.  Further work-up with brain MRI showed no acute infarct. EEG negative for seizures. Patient received intravenous D10, glucagon and octreotide for persistent hypoglycemia. Octreotide was discontinued due to bradycardia. COVID-19 PCR December 28 turn positive, patient received monoclonal [id number] no complications.  Patient tolerating po well.   Now pending transfer to SNF.   Subjective: 1/7 afebrile overnight A/O x1 (does not know where, when, why).  Pleasantly confused   Assessment & Plan: Covid vaccination;   Principal Problem:   Hypoglycemia Active Problems:   HTN (hypertension)   Dementia (HCC)   HLD (hyperlipidemia)   Iron deficiency anemia   CKD (chronic kidney disease), stage IIIa   Depression   Pressure injury of skin   Third degree AV block (HCC)   AKI (acute kidney injury) ([place])   Symptomatic hypoglycemia, ruled out for CVA. -Capillary glucose 86, 74, 64, 90, 77. -Continue with glucose supplementation per mouth 4 g daily. She has very poor oral intake, related to cognitive impairment.  -Continue with dysphagia 2 per speech recommendations.   -1/5 DC D5W.  Patient will not be eligible for SNF while on IV IV drip - 1/7 per NCM patient can be transferred [facility] on 1/8  Acute on CKD stage 3a/complicated urinary retention -Patient had foley cathter placed, she has failed voiding trial on 12/30. -1/6 failed voiding trial.  Foley had to be replaced on 1/7.  -1/7 Flomax 0.4 daily.  Patient may have to be discharged with Foley and follow-up outpatient with urology.  Essential HTN -Although patient's BP slightly high given her age would not try to tightly control BP  Dyslipidemia. -Continue withrosuastatin 10 mg daily  Dementia & Depression / swallow dysfunction. -Continue withdysphagia 2 diet, -Pneumonia ruled out. -Not sure patient is depressed however significant dementia (A/O x1) -Onmemantine and olanzapine.  Anemia with iron deficiency. -Ferrous sulfate 325 mg daily + vitamin C 500 mg daily    Bradycardia with third degree AV block.  -EKG Hr 47, normal axis, right bundle brunch block, normal Qtc, sinus, with no st segment or t wave changes. -1/5 restart telemetry -1/5 obtain repeat EKG patient's HR currently 72.  Evaluate for third-degree AV block -1/5 per cardiology note from 12/27 no mention of third-degree AV block.  Cardiology states they suspect 2-1 AV block.  At that consult on 11/10/2020 did not feel pacemaker indicated however also stated if she has persistent confusion and 2-1 AV block after resolution of other acute medical comorbidities permanent pacemaker placement may need to be addressed -1/6 obtain EKG to determine if AV block resolved prior to discharge.  Stage 2 left buttock, stage 1 right buttock and right posterior thigh pressure ulcer (not able to stage), present on admission.  Pressure Injury 11/05/20 Buttocks Left  Stage 2 -  Partial thickness loss of dermis presenting as a shallow open injury with a red, pink wound bed without slough. (Active)  11/05/20 1200  Location: Buttocks   Location Orientation: Left  Staging: Stage 2 -  Partial thickness loss of dermis presenting as a shallow open injury with a red, pink wound bed without slough.  Wound Description (Comments):   Present on Admission: -- (Unknown)     Pressure Injury 11/05/20 Buttocks Right Stage 1 -  Intact skin with non-blanchable redness of a localized area usually over a bony prominence. (Active)  11/05/20 1217  Location: Buttocks  Location Orientation: Right  Staging: Stage 1 -  Intact skin with non-blanchable redness of a localized area usually over a bony prominence.  Wound Description (Comments):   Present on Admission:      Pressure Injury 11/11/20 Thigh Right;Posterior deep tissue injury (Active)  11/11/20 2330  Location: Thigh  Location Orientation: Right;Posterior  Staging:   Wound Description (Comments): deep tissue injury  Present on Admission:   -Continue with local skin care. -Continue to have very poor mobility due to deconditioning. -1/5 PT/OT consult placed work with patient daily on range of motion and strengthening exercises  Positive Covid 19 infection COVID-19 Labs  Recent Labs    11/19/20 1010 11/20/20 0631  DDIMER 1.01* 0.97*  FERRITIN 445* 426*  LDH 261* 214*  CRP 0.8 0.8    Lab Results  Component Value Date   SARSCOV2NAA POSITIVE (A) 11/11/2020   SARSCOV2NAA POSITIVE (A) 11/11/2020   [place] NEGATIVE 11/04/2020   -S/p monoclonal antibody infusion -Continue isolation for 10 days if continued asymptomatic   DVT prophylaxis: Lovenox Code Status: Full Family Communication:  Status is: Inpatient    Dispo: The patient is from: Home              Anticipated d/c is to: [facility]wait[facility] placement              Anticipated d/c date is: 1/8              Patient currently stable      Consultants:  Cardiology Nephrology  Procedures/Significant Events:    I have personally reviewed and interpreted all radiology studies and my findings are as  above.  VENTILATOR SETTINGS: Nasal cannula 1/7 Flow 2 L/min SPO2 96%   Cultures   Antimicrobials: Anti-infectives (From admission, onward)   Start     Ordered Stop   11/10/20 0900  cefTRIAXone (ROCEPHIN) 2 g in sodium chloride 0.9 % 100 mL IVPB  Status:  Discontinued        11/10/20 0805 11/14/20 1152   11/10/20 0900  metroNIDAZOLE (FLAGYL) tablet 500 mg  Status:  Discontinued        11/10/20 0805 11/14/20 1152       Devices    LINES / TUBES:      Continuous Infusions:   Objective: Vitals:   11/21/20 0118 11/21/20 0549 11/21/20 0835 11/21/20 1100  BP:  (!) 116/52 (!) 110/53 (!) 111/56  Pulse: 83 65 63 65  Resp:  18 16 16   Temp:  98.3 F (36.8 C) 98.6 F (37 C) 98.6 F (37 C)  TempSrc:  Oral    SpO2: 96% 97% 97% 97%  Weight:      Height:        Intake/Output Summary (Last 24 hours) at 11/21/2020 1201 Last data filed at 11/21/2020 0700 Gross per 24 hour  Intake 356 ml  Output 1995 ml  Net -1639 ml   Filed Weights   11/04/20 1340  Weight: 78.7 kg    Examination:  General: A/O x1 (does not know where, when, why), pleasantly confused No acute respiratory distress Eyes: negative scleral hemorrhage, negative anisocoria, negative icterus ENT: Negative Runny nose, negative gingival bleeding, Neck:  Negative scars, masses, torticollis, lymphadenopathy, JVD Lungs: Clear to auscultation bilaterally without wheezes or crackles Cardiovascular: Regular rate and rhythm without murmur gallop or rub normal S1 and S2 Abdomen: negative abdominal pain, nondistended, positive soft, bowel sounds, no rebound, no ascites, no appreciable mass Extremities: No significant cyanosis, clubbing, or edema bilateral lower extremities Skin: Negative rashes, lesions, ulcers Psychiatric: Significant dementia Central nervous system:  Cranial nerves II through XII intact, tongue/uvula midline, all extremities muscle strength 5/5, sensation intact throughout, negative dysarthria,  negative expressive aphasia, negative receptive aphasia.  .     Data Reviewed: Care during the described time interval was provided by me .  I have reviewed this patient's available data, including medical history, events of note, physical examination, and all test results as part of my evaluation.  CBC: Recent Labs  Lab 11/15/20 0459 11/20/20 0631  WBC 6.1 7.8  NEUTROABS 4.0 4.8  HGB 12.1 11.1*  HCT 36.7 34.0*  MCV 91.1 91.6  PLT 218 417   Basic Metabolic Panel: Recent Labs  Lab 11/15/20 0459 11/17/20 0657 11/20/20 0631 11/21/20 0425  NA 142 140 138 139  K 3.1* 3.9 4.0 3.7  CL 108 108 103 106  CO2 25 23 25 25   GLUCOSE 92 101* 91 67*  BUN 26* 25* 23 21  CREATININE 1.02* 0.86 0.84 0.90  CALCIUM 8.7* 8.5* 8.9 8.7*  MG  --   --  2.0  --   PHOS  --   --  3.6  --    GFR: Estimated Creatinine Clearance: 49.5 mL/min (by C-G formula based on SCr of 0.9 mg/dL). Liver Function Tests: Recent Labs  Lab 11/20/20 0631  AST 50*  ALT 31  ALKPHOS 31*  BILITOT 0.6  PROT 6.6  ALBUMIN 2.8*   No results for input(s): LIPASE, AMYLASE in the last 168 hours. No results for input(s): AMMONIA in the last 168 hours. Coagulation Profile: No results for input(s): INR, PROTIME in the last 168 hours. Cardiac Enzymes: No results for input(s): CKTOTAL, CKMB, CKMBINDEX, TROPONINI in the last 168 hours. BNP (last 3 results) No results for input(s): PROBNP in the last 8760 hours. HbA1C: No results for input(s): HGBA1C in the last 72 hours. CBG: Recent Labs  Lab 11/21/20 0550 11/21/20 0614 11/21/20 0835 11/21/20 0926 11/21/20 1152  GLUCAP 67* 91 65* 113* 73   Lipid Profile: No results for input(s): CHOL, HDL, LDLCALC, TRIG, CHOLHDL, LDLDIRECT in the last 72 hours. Thyroid Function Tests: No results for input(s): TSH, T4TOTAL, FREET4, T3FREE, THYROIDAB in the last 72 hours. Anemia Panel: Recent Labs    11/19/20 1010 11/20/20 0631  FERRITIN 445* 426*   Sepsis Labs: No  results for input(s): PROCALCITON, LATICACIDVEN in the last 168 hours.  Recent Results (from the past 240 hour(s))  Resp Panel by RT-PCR (Flu A&B, Covid) Nasopharyngeal Swab     Status: Abnormal   Collection Time: 11/11/20 12:30 PM   Specimen: Nasopharyngeal Swab; Nasopharyngeal(NP) swabs in vial transport medium  Result Value Ref Range Status   SARS Coronavirus 2 by RT PCR POSITIVE (A) NEGATIVE Final    Comment: RESULT CALLED TO, READ BACK BY AND VERIFIED WITH:  TIFFANY ROBINSON 1336 11/11/20 SDR (NOTE)  SARS-CoV-2 target nucleic acids are DETECTED.  The SARS-CoV-2 RNA is generally detectable in upper respiratory specimens during the acute phase of infection. Positive results are indicative of the presence of the identified virus, but do not rule out bacterial infection or co-infection with other pathogens not detected by the test. Clinical correlation with patient history and other diagnostic information is necessary to determine patient infection status. The expected result is Negative.  Fact Sheet for Patients: EntrepreneurPulse.com.au  Fact Sheet for Healthcare Providers: IncredibleEmployment.be  This test is not yet approved or cleared by the [place] FDA and  has been authorized for detection and/or diagnosis of SARS-CoV-2 by FDA under an Emergency Use Authorization (EUA).  This EUA will remain in effect (meaning this test can  be used) for the duration of  the COVID-19 declaration under Section 564(b)(1) of the Act, 21 U.S.C. section 360bbb-3(b)(1), unless the authorization is terminated or revoked sooner.     Influenza A by PCR NEGATIVE NEGATIVE Final   Influenza B by PCR NEGATIVE NEGATIVE Final    Comment: (NOTE) The Xpert Xpress SARS-CoV-2/FLU/RSV plus assay is intended as an aid in the diagnosis of influenza from Nasopharyngeal swab specimens and should not be used as a sole basis for treatment. Nasal washings and aspirates  are unacceptable for Xpert Xpress SARS-CoV-2/FLU/RSV testing.  Fact Sheet for Patients: EntrepreneurPulse.com.au  Fact Sheet for Healthcare Providers: IncredibleEmployment.be  This test is not yet approved or cleared by the [place] FDA and has been authorized for detection and/or diagnosis of SARS-CoV-2 by FDA under an Emergency Use Authorization (EUA). This EUA will remain in effect (meaning this test can be used) for the duration of the COVID-19 declaration under Section 564(b)(1) of the Act, 21 U.S.C. section 360bbb-3(b)(1), unless the authorization is terminated or revoked.  Performed at [facility], [address] [place], [place] [address]   Resp panel by RT-PCR (RSV, Flu A&B, Covid) Nasopharyngeal Swab     Status: Abnormal   Collection Time: 11/11/20  5:17 PM   Specimen: Nasopharyngeal Swab; Nasopharyngeal(NP) swabs in vial transport medium  Result Value Ref Range Status   SARS Coronavirus 2 by RT PCR POSITIVE (A) NEGATIVE Final    Comment: RESULT CALLED TO, READ BACK BY AND VERIFIED WITH: STEPHANIE ROBINSON 11/11/20 1838 KLW (NOTE) SARS-CoV-2 target nucleic acids are DETECTED.  The SARS-CoV-2 RNA is generally detectable in upper respiratory specimens during the acute phase of infection. Positive results are indicative of the presence of the identified virus, but do not rule out bacterial infection or co-infection with other pathogens not detected by the test. Clinical correlation with patient history and other diagnostic information is necessary to determine patient infection status. The expected result is Negative.  Fact Sheet for Patients: EntrepreneurPulse.com.au  Fact Sheet for Healthcare Providers: IncredibleEmployment.be  This test is not yet approved or cleared by the [place] FDA and  has been authorized for detection and/or diagnosis of SARS-CoV-2 by FDA under an  Emergency Use Authorization (EUA).  This EUA will remain in effect (meaning this test can  be used) for the duration of  the COVID-19 declaration under Section 564(b)(1) of the Act, 21 U.S.C. section 360bbb-3(b)(1), unless the authorization is terminated or revoked sooner.     Influenza A by PCR NEGATIVE NEGATIVE Final   Influenza B by PCR NEGATIVE NEGATIVE Final    Comment: (NOTE) The Xpert Xpress SARS-CoV-2/FLU/RSV plus assay is intended as an aid in the diagnosis of influenza from Nasopharyngeal swab specimens and should  not be used as a sole basis for treatment. Nasal washings and aspirates are unacceptable for Xpert Xpress SARS-CoV-2/FLU/RSV testing.  Fact Sheet for Patients: EntrepreneurPulse.com.au  Fact Sheet for Healthcare Providers: IncredibleEmployment.be  This test is not yet approved or cleared by the [place] FDA and has been authorized for detection and/or diagnosis of SARS-CoV-2 by FDA under an Emergency Use Authorization (EUA). This EUA will remain in effect (meaning this test can be used) for the duration of the COVID-19 declaration under Section 564(b)(1) of the Act, 21 U.S.C. section 360bbb-3(b)(1), unless the authorization is terminated or revoked.     Resp Syncytial Virus by PCR NEGATIVE NEGATIVE Final    Comment: (NOTE) Fact Sheet for Patients: EntrepreneurPulse.com.au  Fact Sheet for Healthcare Providers: IncredibleEmployment.be  This test is not yet approved or cleared by the [place] FDA and has been authorized for detection and/or diagnosis of SARS-CoV-2 by FDA under an Emergency Use Authorization (EUA). This EUA will remain in effect (meaning this test can be used) for the duration of the COVID-19 declaration under Section 564(b)(1) of the Act, 21 U.S.C. section 360bbb-3(b)(1), unless the authorization is terminated or revoked.  Performed at [facility],  [address], [place], [place] [address]          Radiology Studies: No results found.      Scheduled Meds: . vitamin C  500 mg Oral Daily  . aspirin EC  81 mg Oral Daily  . Chlorhexidine Gluconate Cloth  6 each Topical Daily  . enoxaparin (LOVENOX) injection  40 mg Subcutaneous Q2200  . feeding supplement  237 mL Oral TID BM  . ferrous sulfate  325 mg Oral Daily  . glucose  1 tablet Oral BID  . magnesium gluconate  250 mg Oral Daily  . melatonin  5 mg Oral QHS  . memantine  28 mg Oral QHS  . [START ON 11/22/2020] multivitamin with minerals  1 tablet Oral Daily  . OLANZapine  5 mg Oral QHS  . rosuvastatin  10 mg Oral Daily   Continuous Infusions:   LOS: 16 days    Time spent:40 min    Kolby Myung, Geraldo Docker, MD [facility] Pager [phone]  If 7PM-7AM, please contact night-coverage [email or web address] Password [email or web address] 11/21/2020, 12:01 PM

## 2020-11-21 NOTE — Progress Notes (Addendum)
Nutrition Follow-up  DOCUMENTATION CODES:   Obesity unspecified  INTERVENTION:  Initiate 48 hour calorie count to assess adequacy of PO intake. RD will follow-up after the weekend to document calorie count.  Provide Ensure Enlive po TID, each supplement provides 350 kcal and 20 grams of protein.  Provide Magic cup TID with meals, each supplement provides 290 kcal and 9 grams of protein.  Provide MVI po daily.  Provide 1:1 assistance with meals.  NUTRITION DIAGNOSIS:   Increased nutrient needs related to catabolic illness ([id number]) as evidenced by estimated needs.  Ongoing.  GOAL:   Patient will meet greater than or equal to 90% of their needs  Unsure how progressing.  MONITOR:   PO intake,Supplement acceptance,Labs,Weight trends,I & O's  REASON FOR ASSESSMENT:   Consult Assessment of nutrition requirement/status  ASSESSMENT:   80 year old female past medical history of hypertension, dyslipidemia, AV block, dementia, iron deficiency anemia, depression and chronic kidney disease stage III who is admitted with COVID 19, AKI, hypoglycemia and AV block  1/5 diet upgraded to dysphagia 2 with thin liquids  Met with patient at bedside. Breakfast tray was at bedside. Patient had been given some orange juice but rest of tray appeared untouched. Patient reports she is unsure how she has been eating at meals. Limited meal documentation available in chart. Patient ate 5-75% of meals on 12/28, 0% of meals on 12/29, 75% of dinner on 12/30, 50-100% of meals on 1/3. No documentation available since 1/3. Now that diet has been upgraded RD will change ONS. Will order 48 hour calorie count to assess adequacy of intake.  Medications reviewed and include: vitamin C 500 mg daily, ferrous sulfate 325 mg daily, magnesium gluconate 250 mg daily.  Labs reviewed: CBG 65-113.  I/O: 2445 mL UOP yesterday (1.3 mL/kg/hr) + 3 occurrences unmeasured UOP  No weight to trend since 12/21  Discussed  with RN.  Diet Order:   Diet Order            DIET DYS 2 Room service appropriate? Yes with Assist; Fluid consistency: Thin  Diet effective now                EDUCATION NEEDS:   No education needs have been identified at this time  Skin:  Skin Assessment: Skin Integrity Issues: Skin Integrity Issues:: DTI,Stage I,Stage II,Other (Comment) DTI: right posterior thigh (8cm x 0.2cm) Stage I: right buttocks Stage II: left buttocks Other: MASD to buttocks   Last BM:  11/22/2019 - small type 4  Height:   Ht Readings from Last 1 Encounters:  11/05/20 $RemoveB'5\' 3"'[id number]$  (1.6 m)   Weight:   Wt Readings from Last 1 Encounters:  11/04/20 78.7 kg   Ideal Body Weight:  52.3 kg  BMI:  Body mass index is 30.75 kg/m.  Estimated Nutritional Needs:   Kcal:  1900-2100  Protein:  95-105 grams  Fluid:  1.9 L/day  Jacklynn Barnacle, MS, RD, LDN Pager number available on Amion

## 2020-11-21 NOTE — Progress Notes (Signed)
Hypoglycemic Event  CBG: 61  Treatment: 4oz OJ   Symptoms: none  Follow-up CBG: Time:1744 CBG Result:66  Possible Reasons for Event: Poor appetite  Comments/MD notified: Patient ate dinner CBG rechecked at 1814 and was 85.  Dr. Sherral Hammers made aware. No new orders.     Alexis Rodriguez

## 2020-11-21 NOTE — Progress Notes (Signed)
Hypoglycemic Event  CBG: 67 at 0550  Treatment: 4 oz orange juice   Symptoms: asymptomatic  Follow-up CBG: [id number] CBG Result:91  Possible Reasons for Event: Decreased PO intake   Comments/MD notified:    Cathie Hoops

## 2020-11-21 NOTE — Progress Notes (Addendum)
Hypoglycemic Event  CBG: 65  Treatment: 4oz OJ Symptoms: None  Follow-up CBG: Time:0926 CBG Result:113  Possible Reasons for Event: Poor PO intake  Comments/MD notified: Dr. Sherral Hammers has been notified. No new orders at this time.       Alexis Rodriguez

## 2020-11-22 DIAGNOSIS — Z743 Need for continuous supervision: Secondary | ICD-10-CM | POA: Diagnosis not present

## 2020-11-22 DIAGNOSIS — M6281 Muscle weakness (generalized): Secondary | ICD-10-CM | POA: Diagnosis not present

## 2020-11-22 DIAGNOSIS — D500 Iron deficiency anemia secondary to blood loss (chronic): Secondary | ICD-10-CM | POA: Diagnosis not present

## 2020-11-22 DIAGNOSIS — N179 Acute kidney failure, unspecified: Secondary | ICD-10-CM | POA: Diagnosis not present

## 2020-11-22 DIAGNOSIS — L89322 Pressure ulcer of left buttock, stage 2: Secondary | ICD-10-CM | POA: Diagnosis not present

## 2020-11-22 DIAGNOSIS — L8945 Pressure ulcer of contiguous site of back, buttock and hip, unstageable: Secondary | ICD-10-CM

## 2020-11-22 DIAGNOSIS — R1311 Dysphagia, oral phase: Secondary | ICD-10-CM | POA: Diagnosis not present

## 2020-11-22 DIAGNOSIS — Z7401 Bed confinement status: Secondary | ICD-10-CM | POA: Diagnosis not present

## 2020-11-22 DIAGNOSIS — T83098A Other mechanical complication of other indwelling urethral catheter, initial encounter: Secondary | ICD-10-CM | POA: Diagnosis not present

## 2020-11-22 DIAGNOSIS — L89311 Pressure ulcer of right buttock, stage 1: Secondary | ICD-10-CM | POA: Diagnosis not present

## 2020-11-22 DIAGNOSIS — E785 Hyperlipidemia, unspecified: Secondary | ICD-10-CM | POA: Diagnosis not present

## 2020-11-22 DIAGNOSIS — R339 Retention of urine, unspecified: Secondary | ICD-10-CM | POA: Diagnosis not present

## 2020-11-22 DIAGNOSIS — R4182 Altered mental status, unspecified: Secondary | ICD-10-CM | POA: Diagnosis not present

## 2020-11-22 DIAGNOSIS — D508 Other iron deficiency anemias: Secondary | ICD-10-CM | POA: Diagnosis not present

## 2020-11-22 DIAGNOSIS — R001 Bradycardia, unspecified: Secondary | ICD-10-CM | POA: Diagnosis not present

## 2020-11-22 DIAGNOSIS — M2550 Pain in unspecified joint: Secondary | ICD-10-CM | POA: Diagnosis not present

## 2020-11-22 DIAGNOSIS — E7800 Pure hypercholesterolemia, unspecified: Secondary | ICD-10-CM | POA: Diagnosis not present

## 2020-11-22 DIAGNOSIS — I129 Hypertensive chronic kidney disease with stage 1 through stage 4 chronic kidney disease, or unspecified chronic kidney disease: Secondary | ICD-10-CM | POA: Diagnosis not present

## 2020-11-22 DIAGNOSIS — G4700 Insomnia, unspecified: Secondary | ICD-10-CM | POA: Diagnosis not present

## 2020-11-22 DIAGNOSIS — I10 Essential (primary) hypertension: Secondary | ICD-10-CM | POA: Diagnosis not present

## 2020-11-22 DIAGNOSIS — R41841 Cognitive communication deficit: Secondary | ICD-10-CM | POA: Diagnosis not present

## 2020-11-22 DIAGNOSIS — N186 End stage renal disease: Secondary | ICD-10-CM | POA: Diagnosis not present

## 2020-11-22 DIAGNOSIS — D649 Anemia, unspecified: Secondary | ICD-10-CM | POA: Diagnosis not present

## 2020-11-22 DIAGNOSIS — F0390 Unspecified dementia without behavioral disturbance: Secondary | ICD-10-CM | POA: Diagnosis not present

## 2020-11-22 DIAGNOSIS — U071 COVID-19: Secondary | ICD-10-CM | POA: Diagnosis present

## 2020-11-22 DIAGNOSIS — F320 Major depressive disorder, single episode, mild: Secondary | ICD-10-CM

## 2020-11-22 DIAGNOSIS — D509 Iron deficiency anemia, unspecified: Secondary | ICD-10-CM | POA: Diagnosis not present

## 2020-11-22 DIAGNOSIS — E161 Other hypoglycemia: Secondary | ICD-10-CM | POA: Diagnosis not present

## 2020-11-22 DIAGNOSIS — N1832 Chronic kidney disease, stage 3b: Secondary | ICD-10-CM | POA: Diagnosis not present

## 2020-11-22 DIAGNOSIS — F3289 Other specified depressive episodes: Secondary | ICD-10-CM | POA: Diagnosis not present

## 2020-11-22 DIAGNOSIS — N1831 Chronic kidney disease, stage 3a: Secondary | ICD-10-CM | POA: Diagnosis not present

## 2020-11-22 DIAGNOSIS — R69 Illness, unspecified: Secondary | ICD-10-CM | POA: Diagnosis not present

## 2020-11-22 DIAGNOSIS — R262 Difficulty in walking, not elsewhere classified: Secondary | ICD-10-CM | POA: Diagnosis not present

## 2020-11-22 DIAGNOSIS — E162 Hypoglycemia, unspecified: Secondary | ICD-10-CM | POA: Diagnosis not present

## 2020-11-22 LAB — COMPREHENSIVE METABOLIC PANEL
ALT: 32 U/L (ref 0–44)
AST: 48 U/L — ABNORMAL HIGH (ref 15–41)
Albumin: 2.9 g/dL — ABNORMAL LOW (ref 3.5–5.0)
Alkaline Phosphatase: 40 U/L (ref 38–126)
Anion gap: 9 (ref 5–15)
BUN: 18 mg/dL (ref 8–23)
CO2: 26 mmol/L (ref 22–32)
Calcium: 9 mg/dL (ref 8.9–10.3)
Chloride: 103 mmol/L (ref 98–111)
Creatinine, Ser: 0.81 mg/dL (ref 0.44–1.00)
GFR, Estimated: >60 mL/min (ref >60)
Glucose, Bld: 80 mg/dL (ref 70–99)
Potassium: 4 mmol/L (ref 3.5–5.1)
Sodium: 138 mmol/L (ref 135–145)
Total Bilirubin: 0.7 mg/dL (ref 0.3–1.2)
Total Protein: 7 g/dL (ref 6.5–8.1)

## 2020-11-22 LAB — C-REACTIVE PROTEIN: CRP: 0.6 mg/dL (ref <1.0)

## 2020-11-22 LAB — CBC WITH DIFFERENTIAL/PLATELET
Abs Immature Granulocytes: 0.04 10*3/uL (ref 0.00–0.07)
Basophils Absolute: 0 10*3/uL (ref 0.0–0.1)
Basophils Relative: 1 %
Eosinophils Absolute: 0.2 10*3/uL (ref 0.0–0.5)
Eosinophils Relative: 2 %
HCT: 34.1 % — ABNORMAL LOW (ref 36.0–46.0)
Hemoglobin: 11.2 g/dL — ABNORMAL LOW (ref 12.0–15.0)
Immature Granulocytes: 1 %
Lymphocytes Relative: 23 %
Lymphs Abs: 1.6 10*3/uL (ref 0.7–4.0)
MCH: 30.4 pg (ref 26.0–34.0)
MCHC: 32.8 g/dL (ref 30.0–36.0)
MCV: 92.7 fL (ref 80.0–100.0)
Monocytes Absolute: 0.6 10*3/uL (ref 0.1–1.0)
Monocytes Relative: 8 %
Neutro Abs: 4.7 10*3/uL (ref 1.7–7.7)
Neutrophils Relative %: 65 %
Platelets: 385 10*3/uL (ref 150–400)
RBC: 3.68 MIL/uL — ABNORMAL LOW (ref 3.87–5.11)
RDW: 13.9 % (ref 11.5–15.5)
WBC: 7.1 10*3/uL (ref 4.0–10.5)
nRBC: 0 % (ref 0.0–0.2)

## 2020-11-22 LAB — GLUCOSE, CAPILLARY
Glucose-Capillary: 115 mg/dL — ABNORMAL HIGH (ref 70–99)
Glucose-Capillary: 61 mg/dL — ABNORMAL LOW (ref 70–99)
Glucose-Capillary: 74 mg/dL (ref 70–99)
Glucose-Capillary: 83 mg/dL (ref 70–99)

## 2020-11-22 LAB — LACTATE DEHYDROGENASE: LDH: 208 U/L — ABNORMAL HIGH (ref 98–192)

## 2020-11-22 LAB — FERRITIN: Ferritin: 428 ng/mL — ABNORMAL HIGH (ref 11–307)

## 2020-11-22 LAB — MAGNESIUM: Magnesium: 2.1 mg/dL (ref 1.7–2.4)

## 2020-11-22 LAB — PHOSPHORUS: Phosphorus: 3.2 mg/dL (ref 2.5–4.6)

## 2020-11-22 MED ORDER — ROSUVASTATIN CALCIUM 10 MG PO TABS: 10.0000 mg | ORAL_TABLET | Freq: Every day | ORAL | 0 refills | Status: AC

## 2020-11-22 MED ORDER — ASPIRIN 81 MG PO TBEC: 81.0000 mg | DELAYED_RELEASE_TABLET | Freq: Every day | ORAL | 0 refills | Status: AC

## 2020-11-22 MED ORDER — MEMANTINE HCL ER 28 MG PO CP24: 28.0000 mg | ORAL_CAPSULE | Freq: Every day | ORAL | 0 refills | Status: AC

## 2020-11-22 MED ORDER — ACETAMINOPHEN 325 MG PO TABS: 650.0000 mg | ORAL_TABLET | ORAL | 0 refills | Status: AC | PRN

## 2020-11-22 MED ORDER — TAMSULOSIN HCL 0.4 MG PO CAPS: 0.4000 mg | ORAL_CAPSULE | Freq: Every day | ORAL | 0 refills | Status: AC

## 2020-11-22 MED ORDER — GLUCOSE 4 G PO CHEW: 1.0000 | CHEWABLE_TABLET | Freq: Two times a day (BID) | ORAL | 0 refills | Status: AC

## 2020-11-22 MED ORDER — ASCORBIC ACID 500 MG PO TABS: 500.0000 mg | ORAL_TABLET | Freq: Every day | ORAL | 0 refills | Status: AC

## 2020-11-22 NOTE — Progress Notes (Signed)
Report called to [place] at [employer]. Questions answered.

## 2020-11-22 NOTE — Discharge Summary (Addendum)
Physician Discharge Summary  Jodie Cavey ONG:[id number] DOB: Apr 11, 1941 DOA: 11/04/2020  PCP: Perrin Maltese, MD  Admit date: 11/04/2020 Discharge date: 11/22/2020  Time spent: 30 minutes  Recommendations for Outpatient Follow-up:   Symptomatic hypoglycemia, ruled out for CVA. -Capillary glucose 86, 74, 64, 90, 77. -Continue with glucose supplementation per mouth 4 g daily.She has very poor oral intake, related to cognitive impairment.  -Continue with dysphagia 2 per speech recommendations. Patient will need constant reminders to eat, and drink secondary to her dementia or she will become hypoglycemic/dehydrated.  Acute on CKD stage 3a/complicated urinary retention -Patient had foley cathter placed, she has failed voiding trial on 12/30. -1/6 failed voiding trial.  Foley had to be replaced on 1/7. -1/7 Flomax 0.4 daily.  -1/8 discharged with [facility].  In the next 24 to 48hr. ensure patient has voiding trial.  If patient fails voiding trial SNF to arrange urology follow-up.    Essential HTN -Although patient's BP slightly high given her age would not try to tightly control BP  Dyslipidemia. -Continue withrosuastatin 10 mg daily  Dementia & Depression / swallow dysfunction. -Continue withdysphagia 2 diet, -Pneumonia ruled out. -Not sure patient is depressed however significant dementia (A/O x1) -Onmemantine and olanzapine.  Anemia with iron deficiency. -Ferrous sulfate 325 mg daily + vitamin C 500 mg daily    Bradycardia with third degree AV block.  -EKG Hr 47, normal axis, right bundle brunch block, normal Qtc, sinus, with no st segment or t wave changes. -1/5 obtain repeat EKG patient's HR currently 72.  Evaluate for third-degree AV block -1/5 per cardiology note from 12/27 no mention of third-degree AV block.  Cardiology states they suspect 2-1 AV block.  At that consult on 11/10/2020 did not feel pacemaker indicated however also stated if she has persistent  confusion and 2-1 AV block after resolution of other acute medical comorbidities permanent pacemaker placement may need to be addressed -1/8 NSR with sinus arrhythmia. Incomplete right bundle branch block. Possible inferior infarct, age undetermined and possible anterolateral infarct, age undetermined.  Stage 2 left buttock, stage 1 right buttock and right posterior thigh pressure ulcer (not able to stage), present on admission. Pressure Injury 11/05/20 Buttocks Left Stage 2 -  Partial thickness loss of dermis presenting as a shallow open injury with a red, pink wound bed without slough. (Active)  11/05/20 1200  Location: Buttocks  Location Orientation: Left  Staging: Stage 2 -  Partial thickness loss of dermis presenting as a shallow open injury with a red, pink wound bed without slough.  Wound Description (Comments):   Present on Admission: -- (Unknown)     Pressure Injury 11/05/20 Buttocks Right Stage 1 -  Intact skin with non-blanchable redness of a localized area usually over a bony prominence. (Active)  11/05/20 1217  Location: Buttocks  Location Orientation: Right  Staging: Stage 1 -  Intact skin with non-blanchable redness of a localized area usually over a bony prominence.  Wound Description (Comments):   Present on Admission:      Pressure Injury 11/11/20 Thigh Right;Posterior deep tissue injury (Active)  11/11/20 2330  Location: Thigh  Location Orientation: Right;Posterior  Staging:   Wound Description (Comments): deep tissue injury  Present on Admission:    Positive Covid 19 infection COVID-19 Labs  Recent Labs    11/20/20 0631 11/21/20 1314 11/22/20 0746  DDIMER 0.97*  --   --   FERRITIN 426* 452* 428*  LDH 214* 214* 208*  CRP 0.8 0.7  --  Lab Results  Component Value Date   SARSCOV2NAA POSITIVE (A) 11/11/2020   SARSCOV2NAA POSITIVE (A) 11/11/2020   [place] NEGATIVE 11/04/2020  -S/p monoclonal antibody infusion -Continue isolation for 10 days if  continued asymptomatic        Discharge Diagnoses:  Principal Problem:   Hypoglycemia Active Problems:   HTN (hypertension)   Dementia (HCC)   HLD (hyperlipidemia)   Iron deficiency anemia   CKD (chronic kidney disease), stage IIIa   Depression   Pressure injury of skin   Third degree AV block (HCC)   AKI (acute kidney injury) ([place])   Discharge Condition: Guarded  Diet recommendation: Dysphagia 2 diet  Filed Weights   11/04/20 1340  Weight: 78.7 kg    History of present illness:  Mrs. Morais was admitted to the hospital with working diagnosis of symptomatic hypoglycemia in the setting of SARS COVID-19 viral infection.  80 year old WF PMHx Dementia, HTN, dyslipidemia, iron deficiency anemia, CKD stage III   Presented with strokelike symptoms for about 1 month. Intermittent confusion, blurry vision, slurred speech and slow to response. No loss of consciousness. In the emergency department her glucose was down to 39. Blood pressure 140/60, heart rate 68, respirate 15, oxygen saturation 96%, her lungs are clear to auscultation bilaterally, heart S1-S2, present rhythmic, soft abdomen, no lower extremity edema, she was confused and disoriented, not agitated.  Further work-up with brain MRI showed no acute infarct. EEG negative for seizures. Patient received intravenous D10, glucagon and octreotide for persistent hypoglycemia. Octreotide was discontinued due to bradycardia. COVID-19 PCR December 28 turn positive, patient received monoclonal [id number] no complications.  Hospital Course:  See above   Ventilator settings: Room air 1/8 SPO2 97%  Consultations: Cardiology Nephrology  Cultures  12/27 acute hepatitis panel negative 12/28 SARS coronavirus positive 12/28 influenza A/B negative  Antibiotics Anti-infectives (From admission, onward)   Start     Ordered Stop   11/10/20 0900  cefTRIAXone (ROCEPHIN) 2 g in sodium chloride 0.9 % 100 mL  IVPB  Status:  Discontinued        11/10/20 0805 11/14/20 1152   11/10/20 0900  metroNIDAZOLE (FLAGYL) tablet 500 mg  Status:  Discontinued        11/10/20 0805 11/14/20 1152       Discharge Exam: Vitals:   11/21/20 1951 11/22/20 0008 11/22/20 0406 11/22/20 0815  BP: 126/75 123/69 135/63 (!) 140/56  Pulse: 78 89 78 70  Resp: 16 18 18 16   Temp: 98.1 F (36.7 C) 98.3 F (36.8 C) 98.1 F (36.7 C) 98.1 F (36.7 C)  TempSrc:  Oral  Oral  SpO2: 98% 95% 97% 97%  Weight:      Height:        General: A/O x1 (does not know where, when, why), pleasantly confused No acute respiratory distress Eyes: negative scleral hemorrhage, negative anisocoria, negative icterus ENT: Negative Runny nose, negative gingival bleeding, Neck:  Negative scars, masses, torticollis, lymphadenopathy, JVD Lungs: Clear to auscultation bilaterally without wheezes or crackles Cardiovascular: Regular rate and rhythm without murmur gallop or rub normal S1 and S2   Discharge Instructions   Allergies as of 11/22/2020      Reactions   Donepezil Hcl Other (See Comments)   Bradycardia and heart block!!!   Shellfish Allergy Anaphylaxis   Penicillins    Other reaction(s): Unknown      Medication List    STOP taking these medications   amLODipine 5 MG tablet Commonly known as: NORVASC   ciprofloxacin 500  MG tablet Commonly known as: CIPRO   fenofibrate 145 MG tablet Commonly known as: TRICOR   hydrochlorothiazide 25 MG tablet Commonly known as: HYDRODIURIL   sertraline 25 MG tablet Commonly known as: ZOLOFT     TAKE these medications   acetaminophen 325 MG tablet Commonly known as: TYLENOL Take 2 tablets (650 mg total) by mouth every 4 (four) hours as needed for mild pain (or temp > 37.5 C (99.5 F)).   ascorbic acid 500 MG tablet Commonly known as: VITAMIN C Take 1 tablet (500 mg total) by mouth daily. Start taking on: November 23, 2020   aspirin 81 MG EC tablet Take 1 tablet (81 mg total) by  mouth daily. Swallow whole. Start taking on: November 23, 2020 What changed:   how much to take  when to take this  additional instructions   ferrous sulfate 325 (65 FE) MG tablet Take 325 mg by mouth daily.   glucose 4 GM chewable tablet Chew 1 tablet (4 g total) by mouth 2 (two) times daily.   Magnesium 250 MG Tabs Take 250 mg by mouth daily.   melatonin 5 MG Tabs Take 5 mg by mouth at bedtime.   memantine 28 MG Cp24 24 hr capsule Commonly known as: NAMENDA XR Take 1 capsule (28 mg total) by mouth at bedtime.   OLANZapine 5 MG tablet Commonly known as: ZYPREXA Take 5 mg by mouth at bedtime.   rosuvastatin 10 MG tablet Commonly known as: CRESTOR Take 1 tablet (10 mg total) by mouth daily. Start taking on: November 23, 2020   tamsulosin 0.4 MG Caps capsule Commonly known as: FLOMAX Take 1 capsule (0.4 mg total) by mouth daily. Start taking on: November 23, 2020      Allergies  Allergen Reactions  . Donepezil Hcl Other (See Comments)    Bradycardia and heart block!!!  . Shellfish Allergy Anaphylaxis  . Penicillins     Other reaction(s): Unknown      The results of significant diagnostics from this hospitalization (including imaging, microbiology, ancillary and laboratory) are listed below for reference.    Significant Diagnostic Studies: EEG  Result Date: 11/06/2020 Alexis Goodell, MD     11/06/2020  7:50 PM ELECTROENCEPHALOGRAM REPORT Patient: Aliyana Dlugosz       Room #: 120A-AA EEG No. ID: [id number] Age: 80 y.o.        Sex: female Requesting Physician: Reesa Chew Report Date:  11/06/2020       Interpreting Physician: Alexis Goodell History: Nakaila Freeze is an 80 y.o. female with episodes of unresponsiveness Medications: ASA, Aricept, Melatonin, Namenda, Zyprexa, Crestor, Zoloft Conditions of Recording:  This is a 21 channel routine scalp EEG performed with bipolar and monopolar montages arranged in accordance to the international 10/20 system of electrode placement. One  channel was dedicated to EKG recording. The patient is in the awake state. Description:  Artifact is prominent during the recording often obscuring the background rhythm. When able to be visualized the background is low voltage and continuous.  Although no posterior background rhythm could be evaluated there was no evidence of epileptiform activity. The patient does not drowse or sleep. Hyperventilation was not performed. Intermittent photic stimulation was performed but failed to illicit any change in the tracing. IMPRESSION: This is a technically difficult awake electroencephalogram secondary to artifact.  No epileptiform activity is noted.  Alexis Goodell, MD Neurology 11/06/2020, 7:47 PM   DG Chest 1 View  Result Date: 11/10/2020 CLINICAL DATA:  Sepsis. EXAM: CHEST  1  VIEW COMPARISON:  Two-view chest x-ray 11/09/2020 FINDINGS: Heart size is normal. Atherosclerotic changes are noted at the aortic arch. Lower lobe airspace opacity is improving. No new airspace disease present. Lung volumes are low. Axial skeleton is within normal limits. Surgical clips are present at the gallbladder fossa. IMPRESSION: 1. Improving bibasilar airspace disease. 2. No new airspace disease. 3. Low lung volumes 4. Aortic atherosclerosis. Electronically Signed   By: San Morelle M.D.   On: 11/10/2020 01:37   DG Chest 2 View  Result Date: 11/09/2020 CLINICAL DATA:  Cough EXAM: CHEST - 2 VIEW COMPARISON:  02/01/2013 FINDINGS: Cardiac shadow is within normal limits. Aortic calcifications are seen. The lungs are well aerated bilaterally. Minimal bibasilar atelectasis is seen left greater than right. No sizable effusion is noted. No bony abnormality is seen. IMPRESSION: Mild bibasilar atelectasis. Electronically Signed   By: Inez Catalina M.D.   On: 11/09/2020 13:11   MR BRAIN WO CONTRAST  Result Date: 11/04/2020 CLINICAL DATA:  Initial evaluation for possible stroke. EXAM: MRI HEAD WITHOUT CONTRAST TECHNIQUE:  Multiplanar, multiecho pulse sequences of the brain and surrounding structures were obtained without intravenous contrast. COMPARISON:  Prior CT from earlier same day. FINDINGS: Brain: Moderately advanced temporal lobe predominant cerebral atrophy. Patchy and confluent T2/FLAIR hyperintensity involving the periventricular deep white matter both cerebral hemispheres most likely related chronic small vessel ischemic disease, moderate in nature. No abnormal foci of restricted diffusion to suggest acute or subacute ischemia. Gray-white matter differentiation maintained. No encephalomalacia to suggest chronic cortical infarction. Tiny remote lacunar infarct present at the left thalamus. No evidence for acute or chronic intracranial hemorrhage. No mass lesion, midline shift or mass effect. No hydrocephalus or extra-axial fluid collection. Incidental note made of a partially empty sella. Midline structures intact. Vascular: Major intracranial vascular flow voids are grossly maintained. Skull and upper cervical spine: Craniocervical junction within normal limits. Bone marrow signal intensity normal. No scalp soft tissue abnormality. Hyperostosis frontalis interna noted. Sinuses/Orbits: Globes and orbital soft tissues within normal limits. Paranasal sinuses are clear. No significant mastoid effusion. Inner ear structures grossly normal. Other: None. IMPRESSION: 1. No acute intracranial abnormality. 2. Moderately advanced temporal lobe predominant cerebral atrophy with chronic small vessel ischemic disease. Electronically Signed   By: Jeannine Boga M.D.   On: 11/04/2020 22:12   MR ABDOMEN W WO CONTRAST  Result Date: 11/06/2020 CLINICAL DATA:  Stroke-like symptoms. Assessment for pancreatic cancer. Chronic kidney disease. Hypertension. EXAM: MRI ABDOMEN WITHOUT AND WITH CONTRAST TECHNIQUE: Multiplanar multisequence MR imaging of the abdomen was performed both before and after the administration of intravenous  contrast. CONTRAST:  54mL GADAVIST GADOBUTROL 1 MMOL/ML IV SOLN COMPARISON:  CT abdomen 11/27/2012 down FINDINGS: Lower chest: Unremarkable Hepatobiliary: 1.6 by 1.4 cm cystic lesion in the lateral segment left hepatic lobe, not appreciably changed from 2014. Additional small stable cysts noted. Prior cholecystectomy. Common hepatic duct at 1.4 cm, further distally the common bile duct measures about 1.0 cm. Tapering of the distal CBD without obvious filling defect. Pancreas: No significant abnormal differential enhancement in the pancreas to suggest pancreatic malignancy. No appreciable restriction of diffusion. No dorsal pancreatic duct dilatation. Spleen:  Unremarkable Adrenals/Urinary Tract: Mild scarring of the right kidney upper pole. The adrenal glands appear unremarkable. Somewhat flaccid but mildly distended urinary bladder on coronal images. Stomach/Bowel: Is small to moderate-sized type 1 hiatal hernia. Periampullary duodenal diverticulum noted without findings of surrounding inflammation. Vascular/Lymphatic: Aortoiliac atherosclerotic vascular disease. No pathologic adenopathy. Other:  No supplemental non-categorized findings. Musculoskeletal:  Chronic appearing superior endplate compression L1. Hemangiomas eccentric to the left at T8 and T10. IMPRESSION: 1. No MRI findings of pancreatic malignancy. 2. Mild extrahepatic biliary dilatation with tapering of the distal CBD, but no obvious filling defect. This could be a physiologic response to cholecystectomy. Type 1 choledochal cyst is a less likely alternative possibility. 3. Small to moderate-sized type 1 hiatal hernia. 4. Mild scarring of the right kidney upper pole. 5. Chronic appearing superior endplate compression L1. 6. Periampullary duodenal diverticulum without findings of surrounding inflammation. Electronically Signed   By: Van Clines M.D.   On: 11/06/2020 07:59   US RENAL  Result Date: 11/10/2020 CLINICAL DATA:  AKI EXAM: RENAL /  URINARY TRACT ULTRASOUND COMPLETE COMPARISON:  11/05/2020 and prior. FINDINGS: Right Kidney: Renal measurements: 10.0 x 4.7 x 4.7 cm = volume: 115.0 mL. Echogenicity within normal limits. No mass or hydronephrosis visualized. Left Kidney: Renal measurements: 10.2 x 5.3 x 4.5 cm = volume: 126.3 mL. Echogenicity within normal limits. No mass or hydronephrosis visualized. Bladder: Decompressed. Other: None. IMPRESSION: Unremarkable renal ultrasound. Electronically Signed   By: Primitivo Gauze M.D.   On: 11/10/2020 12:04   US Carotid Bilateral (at [facility] and [facility] only)  Result Date: 11/05/2020 CLINICAL DATA:  Stroke. Visual disturbance. History of hypertension and hyperlipidemia. EXAM: BILATERAL CAROTID DUPLEX ULTRASOUND TECHNIQUE: Pearline Cables scale imaging, color Doppler and duplex ultrasound were performed of bilateral carotid and vertebral arteries in the neck. COMPARISON:  None. FINDINGS: Criteria: Quantification of carotid stenosis is based on velocity parameters that correlate the residual internal carotid diameter with NASCET-based stenosis levels, using the diameter of the distal internal carotid lumen as the denominator for stenosis measurement. The following velocity measurements were obtained: RIGHT ICA: 52/4 cm/sec CCA: 01/0 cm/sec SYSTOLIC ICA/CCA RATIO:  0.8 ECA: 72 cm/sec LEFT ICA: 62/11 cm/sec CCA: 27/25 cm/sec SYSTOLIC ICA/CCA RATIO:  0.9 ECA: 77 cm/sec RIGHT CAROTID ARTERY: There is a minimal amount of eccentric echogenic plaque involving the origin and proximal aspect the right internal carotid artery (image 23), not resulting in elevated peak systolic velocities within the interrogated course of the right internal carotid artery to suggest a hemodynamically significant stenosis. RIGHT VERTEBRAL ARTERY:  Antegrade flow LEFT CAROTID ARTERY: There is a minimal amount of eccentric echogenic plaque within the left carotid bulb (image 48), extending to involve the origin and proximal aspects of the left  internal carotid artery (image 59), not resulting in elevated peak systolic velocities within the interrogated course the left internal carotid artery to suggest a hemodynamically significant stenosis. LEFT VERTEBRAL ARTERY:  Antegrade flow IMPRESSION: Minimal amount of bilateral atherosclerotic plaque, left subjectively greater than right, not resulting in a hemodynamically significant stenosis within either internal carotid artery. Electronically Signed   By: Sandi Mariscal M.D.   On: 11/05/2020 15:06   ECHOCARDIOGRAM COMPLETE  Result Date: 11/05/2020    ECHOCARDIOGRAM REPORT   Patient Name:   LUCIANA CAMMARATA Date of Exam: 11/05/2020 Medical Rec #:  [id number]  Height:       63.0 in Accession #:    [id number] Weight:       173.6 lb Date of Birth:  Apr 24, 1941  BSA:          1.821 m Patient Age:    52 years   BP:           158/83 mmHg Patient Gender: F          HR:           68 bpm.  Exam Location:  [facility] Procedure: 2D Echo, Cardiac Doppler and Color Doppler Indications:     Stroke 434.91  History:         Patient has no prior history of Echocardiogram examinations.                  Risk Factors:Hypertension. Dementia.  Sonographer:     Sherrie Sport RDCS (AE) Referring Phys:  [id number] Soledad Gerlach NIU Diagnosing Phys: Kate Sable MD  Sonographer Comments: Suboptimal apical window. IMPRESSIONS  1. Left ventricular ejection fraction, by estimation, is 60 to 65%. The left ventricle has normal function. The left ventricle has no regional wall motion abnormalities. Left ventricular diastolic parameters are consistent with Grade I diastolic dysfunction (impaired relaxation).  2. Right ventricular systolic function is normal. The right ventricular size is normal.  3. The mitral valve is normal in structure. No evidence of mitral valve regurgitation.  4. The aortic valve is tricuspid. Aortic valve regurgitation is not visualized. Mild aortic valve sclerosis is present, with no evidence of aortic valve stenosis.  5. Echogenic structure  noted in the posterior aorta (clips 2,3,7) this could be artifactual vs supravalvular membrane. Conclusion(s)/Recommendation(s): No intracardiac source of embolism detected on this transthoracic study. A transesophageal echocardiogram is recommended to exclude cardiac source of embolism if clinically indicated. FINDINGS  Left Ventricle: Left ventricular ejection fraction, by estimation, is 60 to 65%. The left ventricle has normal function. The left ventricle has no regional wall motion abnormalities. The left ventricular internal cavity size was normal in size. There is  no left ventricular hypertrophy. Left ventricular diastolic parameters are consistent with Grade I diastolic dysfunction (impaired relaxation). Right Ventricle: The right ventricular size is normal. No increase in right ventricular wall thickness. Right ventricular systolic function is normal. Left Atrium: Left atrial size was normal in size. Right Atrium: Right atrial size was normal in size. Pericardium: There is no evidence of pericardial effusion. Mitral Valve: The mitral valve is normal in structure. No evidence of mitral valve regurgitation. Tricuspid Valve: The tricuspid valve is normal in structure. Tricuspid valve regurgitation is not demonstrated. Aortic Valve: The aortic valve is tricuspid. Aortic valve regurgitation is not visualized. Mild aortic valve sclerosis is present, with no evidence of aortic valve stenosis. Aortic valve mean gradient measures 4.7 mmHg. Aortic valve peak gradient measures 8.0 mmHg. Aortic valve area, by VTI measures 1.61 cm. Pulmonic Valve: The pulmonic valve was normal in structure. Pulmonic valve regurgitation is not visualized. Aorta: Echogenic structure noted in the posterior aorta (clips 2,3,7) this could be artifactual vs supravalvular membrane. The aortic root is normal in size and structure. Venous: The inferior vena cava was not well visualized. IAS/Shunts: No atrial level shunt detected by color flow  Doppler.  LEFT VENTRICLE PLAX 2D LVIDd:         3.95 cm  Diastology LVIDs:         2.27 cm  LV e' medial:    4.03 cm/s LV PW:         1.13 cm  LV E/e' medial:  17.6 LV IVS:        0.88 cm  LV e' lateral:   4.79 cm/s LVOT diam:     2.00 cm  LV E/e' lateral: 14.8 LV SV:         48 LV SV Index:   26 LVOT Area:     3.14 cm  LEFT ATRIUM             Index  RIGHT ATRIUM          Index LA diam:        3.50 cm 1.92 cm/m  RA Area:     9.69 cm LA Vol (A2C):   22.9 ml 12.58 ml/m RA Volume:   19.00 ml 10.44 ml/m LA Vol (A4C):   23.7 ml 13.02 ml/m LA Biplane Vol: 24.0 ml 13.18 ml/m  AORTIC VALVE                   PULMONIC VALVE AV Area (Vmax):    1.56 cm    PV Vmax:        0.79 m/s AV Area (Vmean):   1.60 cm    PV Peak grad:   2.5 mmHg AV Area (VTI):     1.61 cm    RVOT Peak grad: 4 mmHg AV Vmax:           141.33 cm/s AV Vmean:          99.033 cm/s AV VTI:            0.297 m AV Peak Grad:      8.0 mmHg AV Mean Grad:      4.7 mmHg LVOT Vmax:         70.20 cm/s LVOT Vmean:        50.400 cm/s LVOT VTI:          0.152 m LVOT/AV VTI ratio: 0.51  AORTA Ao Root diam: 2.90 cm MITRAL VALVE MV Area (PHT): 1.92 cm     SHUNTS MV Decel Time: 396 msec     Systemic VTI:  0.15 m MV E velocity: 70.80 cm/s   Systemic Diam: 2.00 cm MV A velocity: 113.00 cm/s MV E/A ratio:  0.63 Kate Sable MD Electronically signed by Kate Sable MD Signature Date/Time: 11/05/2020/2:41:47 PM    Final    CT HEAD CODE STROKE WO CONTRAST  Result Date: 11/04/2020 CLINICAL DATA:  Code stroke. Neuro deficit, acute stroke suspected. Sudden speech in vision issues. EXAM: CT HEAD WITHOUT CONTRAST TECHNIQUE: Contiguous axial images were obtained from the base of the skull through the vertex without intravenous contrast. COMPARISON:  CT head February 01, 2013. FINDINGS: Brain: No evidence of acute large vascular territory infarction, hemorrhage, hydrocephalus, extra-axial collection or mass lesion/mass effect. Patchy white matter hypoattenuation,  most likely related to chronic microvascular disease. Mild generalized cerebral volume loss with ex vacuo ventricular dilation. Vascular: No hyperdense vessel identified. Calcific atherosclerosis. Skull: No acute fracture. Sinuses/Orbits: No acute findings. Other: No mastoid effusions. ASPECTS [facility] Stroke Program Early CT Score) total score (0-10 with 10 being normal): 10. IMPRESSION: 1. No evidence of acute intracranial abnormality.ASPECTS is 10. 2. Chronic microvascular ischemic disease and generalized cerebral volume loss. Code stroke imaging results were communicated on 11/04/2020 at 1:40 pm to provider Dr. Charna Archer Via telephone, who verbally acknowledged these results. Electronically Signed   By: Margaretha Sheffield MD   On: 11/04/2020 13:42    Microbiology: No results found for this or any previous visit (from the past 240 hour(s)).   Labs: Basic Metabolic Panel: Recent Labs  Lab 11/17/20 0657 11/20/20 0631 11/21/20 0425 11/21/20 1314 11/22/20 0746  NA 140 138 139 137 138  K 3.9 4.0 3.7 4.1 4.0  CL 108 103 106 103 103  CO2 23 25 25 26 26   GLUCOSE 101* 91 67* 76 80  BUN 25* 23 21 20 18   CREATININE 0.86 0.84 0.90 0.82 0.81  CALCIUM 8.5* 8.9 8.7* 8.9  9.0  MG  --  2.0  --  2.2 2.1  PHOS  --  3.6  --  4.0 3.2   Liver Function Tests: Recent Labs  Lab 11/20/20 0631 11/21/20 1314 11/22/20 0746  AST 50* 57* 48*  ALT 31 33 32  ALKPHOS 31* 36* 40  BILITOT 0.6 0.7 0.7  PROT 6.6 7.0 7.0  ALBUMIN 2.8* 3.0* 2.9*   No results for input(s): LIPASE, AMYLASE in the last 168 hours. No results for input(s): AMMONIA in the last 168 hours. CBC: Recent Labs  Lab 11/20/20 0631 11/21/20 1314 11/22/20 0746  WBC 7.8 7.8 7.1  NEUTROABS 4.8 4.8 4.7  HGB 11.1* 11.6* 11.2*  HCT 34.0* 35.8* 34.1*  MCV 91.6 92.7 92.7  PLT 386 398 385   Cardiac Enzymes: No results for input(s): CKTOTAL, CKMB, CKMBINDEX, TROPONINI in the last 168 hours. BNP: BNP (last 3 results) No results for input(s):  BNP in the last 8760 hours.  ProBNP (last 3 results) No results for input(s): PROBNP in the last 8760 hours.  CBG: Recent Labs  Lab 11/21/20 1814 11/21/20 1947 11/22/20 0005 11/22/20 0402 11/22/20 0832  GLUCAP 85 110* 115* 74 61*       Signed:  Dia Crawford, MD [facility] [phone] pager

## 2020-11-22 NOTE — Progress Notes (Signed)
Patient's daughter Kizzy Olafson updated on POC and discharge status via phone

## 2020-11-22 NOTE — TOC Transition Note (Signed)
Transition of Care [facility]) - CM/SW Discharge Note   Patient Details  Name: Alexis Rodriguez MRN: [id number] Date of Birth: 06-Sep-1941  Transition of Care [facility]) CM/SW Contact:  Boris Sharper, LCSW Phone Number: 11/22/2020, 11:23 AM   Clinical Narrative:    Pt medially stable for discharge per MD. Pt will be [facility]. Call to report number is [phone]. Pt's daughter notified of discharge.CSW arranged EMS transport.   Final next level of care: Skilled Nursing Facility Barriers to Discharge: No Barriers Identified   Patient Goals and CMS Choice Patient states their goals for this hospitalization and ongoing recovery are:: home with home health CMS Medicare.gov Compare Post Acute Care list provided to:: Patient Represenative (must comment) Choice offered to / list presented to : Adult Children  Discharge Placement                Patient to be transferred to facility by: EMS Name of family member notified: Hurshel Keys Patient and family notified of of transfer: 11/22/20  Discharge Plan and Services     Post Acute Care Choice: [place]          DME Arranged: 3-N-1 DME Agency: [employer] Date DME Agency Contacted: 11/05/20   Representative spoke with at DME Agency: Mardene Celeste [facility] Arranged: PT,OT          Social Determinants of Health (SDOH) Interventions     Readmission Risk Interventions No flowsheet data found.

## 2020-11-24 DIAGNOSIS — N1831 Chronic kidney disease, stage 3a: Secondary | ICD-10-CM | POA: Diagnosis not present

## 2020-11-24 DIAGNOSIS — L89311 Pressure ulcer of right buttock, stage 1: Secondary | ICD-10-CM | POA: Diagnosis not present

## 2020-11-24 DIAGNOSIS — R69 Illness, unspecified: Secondary | ICD-10-CM | POA: Diagnosis not present

## 2020-11-24 DIAGNOSIS — L89322 Pressure ulcer of left buttock, stage 2: Secondary | ICD-10-CM | POA: Diagnosis not present

## 2020-11-24 DIAGNOSIS — I129 Hypertensive chronic kidney disease with stage 1 through stage 4 chronic kidney disease, or unspecified chronic kidney disease: Secondary | ICD-10-CM | POA: Diagnosis not present

## 2020-11-24 DIAGNOSIS — E162 Hypoglycemia, unspecified: Secondary | ICD-10-CM | POA: Diagnosis not present

## 2020-11-25 LAB — GLUCAGON: Glucagon Lvl: 46 pg/mL — ABNORMAL LOW (ref 50–150)

## 2020-11-28 DIAGNOSIS — L89311 Pressure ulcer of right buttock, stage 1: Secondary | ICD-10-CM | POA: Diagnosis not present

## 2020-11-28 DIAGNOSIS — N1831 Chronic kidney disease, stage 3a: Secondary | ICD-10-CM | POA: Diagnosis not present

## 2020-11-28 DIAGNOSIS — L89322 Pressure ulcer of left buttock, stage 2: Secondary | ICD-10-CM | POA: Diagnosis not present

## 2020-11-28 DIAGNOSIS — E162 Hypoglycemia, unspecified: Secondary | ICD-10-CM | POA: Diagnosis not present

## 2020-11-28 DIAGNOSIS — R69 Illness, unspecified: Secondary | ICD-10-CM | POA: Diagnosis not present

## 2020-12-02 DIAGNOSIS — T83098A Other mechanical complication of other indwelling urethral catheter, initial encounter: Secondary | ICD-10-CM | POA: Diagnosis not present

## 2020-12-02 DIAGNOSIS — E162 Hypoglycemia, unspecified: Secondary | ICD-10-CM | POA: Diagnosis not present

## 2020-12-04 DIAGNOSIS — R69 Illness, unspecified: Secondary | ICD-10-CM | POA: Diagnosis not present

## 2020-12-04 DIAGNOSIS — N1831 Chronic kidney disease, stage 3a: Secondary | ICD-10-CM | POA: Diagnosis not present

## 2020-12-04 DIAGNOSIS — R339 Retention of urine, unspecified: Secondary | ICD-10-CM | POA: Diagnosis not present

## 2020-12-12 DIAGNOSIS — E162 Hypoglycemia, unspecified: Secondary | ICD-10-CM | POA: Diagnosis not present

## 2020-12-12 DIAGNOSIS — R339 Retention of urine, unspecified: Secondary | ICD-10-CM | POA: Diagnosis not present

## 2020-12-17 DIAGNOSIS — I129 Hypertensive chronic kidney disease with stage 1 through stage 4 chronic kidney disease, or unspecified chronic kidney disease: Secondary | ICD-10-CM | POA: Diagnosis not present

## 2020-12-17 DIAGNOSIS — F0390 Unspecified dementia without behavioral disturbance: Secondary | ICD-10-CM | POA: Diagnosis not present

## 2020-12-17 DIAGNOSIS — N1831 Chronic kidney disease, stage 3a: Secondary | ICD-10-CM | POA: Diagnosis not present

## 2020-12-17 DIAGNOSIS — R001 Bradycardia, unspecified: Secondary | ICD-10-CM | POA: Diagnosis not present

## 2020-12-17 DIAGNOSIS — G4700 Insomnia, unspecified: Secondary | ICD-10-CM | POA: Diagnosis not present

## 2020-12-17 DIAGNOSIS — R339 Retention of urine, unspecified: Secondary | ICD-10-CM | POA: Diagnosis not present

## 2020-12-17 DIAGNOSIS — F3289 Other specified depressive episodes: Secondary | ICD-10-CM | POA: Diagnosis not present

## 2020-12-19 DIAGNOSIS — R69 Illness, unspecified: Secondary | ICD-10-CM | POA: Diagnosis not present

## 2020-12-19 DIAGNOSIS — E162 Hypoglycemia, unspecified: Secondary | ICD-10-CM | POA: Diagnosis not present

## 2020-12-19 DIAGNOSIS — N1831 Chronic kidney disease, stage 3a: Secondary | ICD-10-CM | POA: Diagnosis not present

## 2020-12-19 DIAGNOSIS — I129 Hypertensive chronic kidney disease with stage 1 through stage 4 chronic kidney disease, or unspecified chronic kidney disease: Secondary | ICD-10-CM | POA: Diagnosis not present

## 2020-12-19 DIAGNOSIS — R339 Retention of urine, unspecified: Secondary | ICD-10-CM | POA: Diagnosis not present

## 2020-12-23 DIAGNOSIS — D378 Neoplasm of uncertain behavior of other specified digestive organs: Secondary | ICD-10-CM | POA: Diagnosis not present

## 2020-12-23 DIAGNOSIS — E162 Hypoglycemia, unspecified: Secondary | ICD-10-CM | POA: Diagnosis not present

## 2020-12-23 DIAGNOSIS — R69 Illness, unspecified: Secondary | ICD-10-CM | POA: Diagnosis not present

## 2020-12-23 DIAGNOSIS — G309 Alzheimer's disease, unspecified: Secondary | ICD-10-CM | POA: Diagnosis not present

## 2020-12-23 DIAGNOSIS — I442 Atrioventricular block, complete: Secondary | ICD-10-CM | POA: Diagnosis not present

## 2020-12-23 DIAGNOSIS — Z7982 Long term (current) use of aspirin: Secondary | ICD-10-CM | POA: Diagnosis not present

## 2020-12-23 DIAGNOSIS — M5136 Other intervertebral disc degeneration, lumbar region: Secondary | ICD-10-CM | POA: Diagnosis not present

## 2020-12-23 DIAGNOSIS — K869 Disease of pancreas, unspecified: Secondary | ICD-10-CM | POA: Diagnosis not present

## 2020-12-23 DIAGNOSIS — I129 Hypertensive chronic kidney disease with stage 1 through stage 4 chronic kidney disease, or unspecified chronic kidney disease: Secondary | ICD-10-CM | POA: Diagnosis not present

## 2020-12-23 DIAGNOSIS — E785 Hyperlipidemia, unspecified: Secondary | ICD-10-CM | POA: Diagnosis not present

## 2020-12-23 DIAGNOSIS — N1831 Chronic kidney disease, stage 3a: Secondary | ICD-10-CM | POA: Diagnosis not present

## 2020-12-23 DIAGNOSIS — R339 Retention of urine, unspecified: Secondary | ICD-10-CM | POA: Diagnosis not present

## 2020-12-23 DIAGNOSIS — D509 Iron deficiency anemia, unspecified: Secondary | ICD-10-CM | POA: Diagnosis not present

## 2020-12-23 DIAGNOSIS — D137 Benign neoplasm of endocrine pancreas: Secondary | ICD-10-CM | POA: Diagnosis not present

## 2020-12-23 DIAGNOSIS — D631 Anemia in chronic kidney disease: Secondary | ICD-10-CM | POA: Diagnosis not present

## 2020-12-24 DIAGNOSIS — Z20822 Contact with and (suspected) exposure to covid-19: Secondary | ICD-10-CM | POA: Diagnosis not present

## 2020-12-24 DIAGNOSIS — R627 Adult failure to thrive: Secondary | ICD-10-CM | POA: Diagnosis not present

## 2020-12-24 DIAGNOSIS — N189 Chronic kidney disease, unspecified: Secondary | ICD-10-CM | POA: Diagnosis not present

## 2020-12-24 DIAGNOSIS — Z515 Encounter for palliative care: Secondary | ICD-10-CM | POA: Diagnosis not present

## 2020-12-24 DIAGNOSIS — D378 Neoplasm of uncertain behavior of other specified digestive organs: Secondary | ICD-10-CM | POA: Diagnosis not present

## 2020-12-24 DIAGNOSIS — R630 Anorexia: Secondary | ICD-10-CM | POA: Diagnosis not present

## 2020-12-24 DIAGNOSIS — D137 Benign neoplasm of endocrine pancreas: Secondary | ICD-10-CM | POA: Diagnosis not present

## 2020-12-24 DIAGNOSIS — R7982 Elevated C-reactive protein (CRP): Secondary | ICD-10-CM | POA: Diagnosis not present

## 2020-12-24 DIAGNOSIS — D509 Iron deficiency anemia, unspecified: Secondary | ICD-10-CM | POA: Diagnosis not present

## 2020-12-24 DIAGNOSIS — I4510 Unspecified right bundle-branch block: Secondary | ICD-10-CM | POA: Diagnosis not present

## 2020-12-24 DIAGNOSIS — R7989 Other specified abnormal findings of blood chemistry: Secondary | ICD-10-CM | POA: Diagnosis not present

## 2020-12-24 DIAGNOSIS — N390 Urinary tract infection, site not specified: Secondary | ICD-10-CM | POA: Diagnosis not present

## 2020-12-24 DIAGNOSIS — Z91013 Allergy to seafood: Secondary | ICD-10-CM | POA: Diagnosis not present

## 2020-12-24 DIAGNOSIS — R69 Illness, unspecified: Secondary | ICD-10-CM | POA: Diagnosis not present

## 2020-12-24 DIAGNOSIS — E785 Hyperlipidemia, unspecified: Secondary | ICD-10-CM | POA: Diagnosis not present

## 2020-12-24 DIAGNOSIS — Z79899 Other long term (current) drug therapy: Secondary | ICD-10-CM | POA: Diagnosis not present

## 2020-12-24 DIAGNOSIS — B952 Enterococcus as the cause of diseases classified elsewhere: Secondary | ICD-10-CM | POA: Diagnosis not present

## 2020-12-24 DIAGNOSIS — Y846 Urinary catheterization as the cause of abnormal reaction of the patient, or of later complication, without mention of misadventure at the time of the procedure: Secondary | ICD-10-CM | POA: Diagnosis not present

## 2020-12-24 DIAGNOSIS — R339 Retention of urine, unspecified: Secondary | ICD-10-CM | POA: Diagnosis not present

## 2020-12-24 DIAGNOSIS — E162 Hypoglycemia, unspecified: Secondary | ICD-10-CM | POA: Diagnosis not present

## 2020-12-24 DIAGNOSIS — Z7982 Long term (current) use of aspirin: Secondary | ICD-10-CM | POA: Diagnosis not present

## 2020-12-24 DIAGNOSIS — I491 Atrial premature depolarization: Secondary | ICD-10-CM | POA: Diagnosis not present

## 2020-12-24 DIAGNOSIS — Z66 Do not resuscitate: Secondary | ICD-10-CM | POA: Diagnosis not present

## 2020-12-24 DIAGNOSIS — I129 Hypertensive chronic kidney disease with stage 1 through stage 4 chronic kidney disease, or unspecified chronic kidney disease: Secondary | ICD-10-CM | POA: Diagnosis not present

## 2020-12-24 DIAGNOSIS — R932 Abnormal findings on diagnostic imaging of liver and biliary tract: Secondary | ICD-10-CM | POA: Diagnosis not present

## 2020-12-24 DIAGNOSIS — K8689 Other specified diseases of pancreas: Secondary | ICD-10-CM | POA: Diagnosis not present

## 2020-12-24 DIAGNOSIS — K869 Disease of pancreas, unspecified: Secondary | ICD-10-CM | POA: Diagnosis not present

## 2020-12-24 DIAGNOSIS — R4182 Altered mental status, unspecified: Secondary | ICD-10-CM | POA: Diagnosis not present

## 2020-12-24 DIAGNOSIS — I442 Atrioventricular block, complete: Secondary | ICD-10-CM | POA: Diagnosis not present

## 2020-12-24 DIAGNOSIS — N186 End stage renal disease: Secondary | ICD-10-CM | POA: Diagnosis not present

## 2020-12-24 DIAGNOSIS — K861 Other chronic pancreatitis: Secondary | ICD-10-CM | POA: Diagnosis not present

## 2020-12-24 DIAGNOSIS — R262 Difficulty in walking, not elsewhere classified: Secondary | ICD-10-CM | POA: Diagnosis not present

## 2020-12-24 DIAGNOSIS — C7A8 Other malignant neuroendocrine tumors: Secondary | ICD-10-CM | POA: Diagnosis not present

## 2020-12-24 DIAGNOSIS — U071 COVID-19: Secondary | ICD-10-CM | POA: Diagnosis not present

## 2020-12-24 DIAGNOSIS — N1831 Chronic kidney disease, stage 3a: Secondary | ICD-10-CM | POA: Diagnosis not present

## 2020-12-24 DIAGNOSIS — T83518A Infection and inflammatory reaction due to other urinary catheter, initial encounter: Secondary | ICD-10-CM | POA: Diagnosis not present

## 2020-12-24 DIAGNOSIS — R9341 Abnormal radiologic findings on diagnostic imaging of renal pelvis, ureter, or bladder: Secondary | ICD-10-CM | POA: Diagnosis not present

## 2020-12-24 DIAGNOSIS — R947 Abnormal results of other endocrine function studies: Secondary | ICD-10-CM | POA: Diagnosis not present

## 2020-12-24 DIAGNOSIS — M6281 Muscle weakness (generalized): Secondary | ICD-10-CM | POA: Diagnosis not present

## 2020-12-24 DIAGNOSIS — Z880 Allergy status to penicillin: Secondary | ICD-10-CM | POA: Diagnosis not present

## 2020-12-24 DIAGNOSIS — I519 Heart disease, unspecified: Secondary | ICD-10-CM | POA: Diagnosis not present

## 2020-12-24 DIAGNOSIS — I10 Essential (primary) hypertension: Secondary | ICD-10-CM | POA: Diagnosis not present

## 2020-12-25 DIAGNOSIS — F0390 Unspecified dementia without behavioral disturbance: Secondary | ICD-10-CM | POA: Insufficient documentation

## 2021-01-23 DIAGNOSIS — Z466 Encounter for fitting and adjustment of urinary device: Secondary | ICD-10-CM | POA: Diagnosis not present

## 2021-01-23 DIAGNOSIS — D378 Neoplasm of uncertain behavior of other specified digestive organs: Secondary | ICD-10-CM | POA: Diagnosis not present

## 2021-01-23 DIAGNOSIS — E785 Hyperlipidemia, unspecified: Secondary | ICD-10-CM | POA: Diagnosis not present

## 2021-01-23 DIAGNOSIS — R69 Illness, unspecified: Secondary | ICD-10-CM | POA: Diagnosis not present

## 2021-01-23 DIAGNOSIS — D137 Benign neoplasm of endocrine pancreas: Secondary | ICD-10-CM | POA: Diagnosis not present

## 2021-01-23 DIAGNOSIS — M519 Unspecified thoracic, thoracolumbar and lumbosacral intervertebral disc disorder: Secondary | ICD-10-CM | POA: Diagnosis not present

## 2021-01-23 DIAGNOSIS — E162 Hypoglycemia, unspecified: Secondary | ICD-10-CM | POA: Diagnosis not present

## 2021-01-23 DIAGNOSIS — R5381 Other malaise: Secondary | ICD-10-CM | POA: Diagnosis not present

## 2021-01-23 DIAGNOSIS — R262 Difficulty in walking, not elsewhere classified: Secondary | ICD-10-CM | POA: Diagnosis not present

## 2021-01-23 DIAGNOSIS — N390 Urinary tract infection, site not specified: Secondary | ICD-10-CM | POA: Diagnosis not present

## 2021-01-23 DIAGNOSIS — I4510 Unspecified right bundle-branch block: Secondary | ICD-10-CM | POA: Diagnosis not present

## 2021-01-23 DIAGNOSIS — R279 Unspecified lack of coordination: Secondary | ICD-10-CM | POA: Diagnosis not present

## 2021-01-23 DIAGNOSIS — R627 Adult failure to thrive: Secondary | ICD-10-CM | POA: Diagnosis not present

## 2021-01-23 DIAGNOSIS — G309 Alzheimer's disease, unspecified: Secondary | ICD-10-CM | POA: Diagnosis not present

## 2021-01-23 DIAGNOSIS — M6281 Muscle weakness (generalized): Secondary | ICD-10-CM | POA: Diagnosis not present

## 2021-01-23 DIAGNOSIS — I10 Essential (primary) hypertension: Secondary | ICD-10-CM | POA: Diagnosis not present

## 2021-01-23 DIAGNOSIS — B952 Enterococcus as the cause of diseases classified elsewhere: Secondary | ICD-10-CM | POA: Diagnosis not present

## 2021-01-23 DIAGNOSIS — G4700 Insomnia, unspecified: Secondary | ICD-10-CM | POA: Diagnosis not present

## 2021-01-23 DIAGNOSIS — R339 Retention of urine, unspecified: Secondary | ICD-10-CM | POA: Diagnosis not present

## 2021-01-23 DIAGNOSIS — N186 End stage renal disease: Secondary | ICD-10-CM | POA: Diagnosis not present

## 2021-01-23 DIAGNOSIS — D509 Iron deficiency anemia, unspecified: Secondary | ICD-10-CM | POA: Diagnosis not present

## 2021-01-26 DIAGNOSIS — D137 Benign neoplasm of endocrine pancreas: Secondary | ICD-10-CM | POA: Diagnosis not present

## 2021-01-26 DIAGNOSIS — R339 Retention of urine, unspecified: Secondary | ICD-10-CM | POA: Diagnosis not present

## 2021-01-26 DIAGNOSIS — G309 Alzheimer's disease, unspecified: Secondary | ICD-10-CM | POA: Diagnosis not present

## 2021-01-26 DIAGNOSIS — R69 Illness, unspecified: Secondary | ICD-10-CM | POA: Diagnosis not present

## 2021-02-03 DIAGNOSIS — M6281 Muscle weakness (generalized): Secondary | ICD-10-CM | POA: Diagnosis not present

## 2021-02-03 DIAGNOSIS — N186 End stage renal disease: Secondary | ICD-10-CM | POA: Diagnosis not present

## 2021-02-03 DIAGNOSIS — R262 Difficulty in walking, not elsewhere classified: Secondary | ICD-10-CM | POA: Diagnosis not present

## 2021-02-03 DIAGNOSIS — E162 Hypoglycemia, unspecified: Secondary | ICD-10-CM | POA: Diagnosis not present

## 2021-02-06 DIAGNOSIS — D509 Iron deficiency anemia, unspecified: Secondary | ICD-10-CM | POA: Diagnosis not present

## 2021-02-06 DIAGNOSIS — I442 Atrioventricular block, complete: Secondary | ICD-10-CM | POA: Diagnosis not present

## 2021-02-06 DIAGNOSIS — I129 Hypertensive chronic kidney disease with stage 1 through stage 4 chronic kidney disease, or unspecified chronic kidney disease: Secondary | ICD-10-CM | POA: Diagnosis not present

## 2021-02-06 DIAGNOSIS — Z7982 Long term (current) use of aspirin: Secondary | ICD-10-CM | POA: Diagnosis not present

## 2021-02-06 DIAGNOSIS — R69 Illness, unspecified: Secondary | ICD-10-CM | POA: Diagnosis not present

## 2021-02-06 DIAGNOSIS — E785 Hyperlipidemia, unspecified: Secondary | ICD-10-CM | POA: Diagnosis not present

## 2021-02-06 DIAGNOSIS — R339 Retention of urine, unspecified: Secondary | ICD-10-CM | POA: Diagnosis not present

## 2021-02-06 DIAGNOSIS — E162 Hypoglycemia, unspecified: Secondary | ICD-10-CM | POA: Diagnosis not present

## 2021-02-06 DIAGNOSIS — N1831 Chronic kidney disease, stage 3a: Secondary | ICD-10-CM | POA: Diagnosis not present

## 2021-02-09 DIAGNOSIS — N1831 Chronic kidney disease, stage 3a: Secondary | ICD-10-CM | POA: Diagnosis not present

## 2021-02-09 DIAGNOSIS — D509 Iron deficiency anemia, unspecified: Secondary | ICD-10-CM | POA: Diagnosis not present

## 2021-02-09 DIAGNOSIS — E785 Hyperlipidemia, unspecified: Secondary | ICD-10-CM | POA: Diagnosis not present

## 2021-02-09 DIAGNOSIS — E162 Hypoglycemia, unspecified: Secondary | ICD-10-CM | POA: Diagnosis not present

## 2021-02-09 DIAGNOSIS — R339 Retention of urine, unspecified: Secondary | ICD-10-CM | POA: Diagnosis not present

## 2021-02-09 DIAGNOSIS — I129 Hypertensive chronic kidney disease with stage 1 through stage 4 chronic kidney disease, or unspecified chronic kidney disease: Secondary | ICD-10-CM | POA: Diagnosis not present

## 2021-02-09 DIAGNOSIS — Z7982 Long term (current) use of aspirin: Secondary | ICD-10-CM | POA: Diagnosis not present

## 2021-02-09 DIAGNOSIS — I442 Atrioventricular block, complete: Secondary | ICD-10-CM | POA: Diagnosis not present

## 2021-02-09 DIAGNOSIS — R69 Illness, unspecified: Secondary | ICD-10-CM | POA: Diagnosis not present

## 2021-02-13 DIAGNOSIS — Z7982 Long term (current) use of aspirin: Secondary | ICD-10-CM | POA: Diagnosis not present

## 2021-02-13 DIAGNOSIS — D509 Iron deficiency anemia, unspecified: Secondary | ICD-10-CM | POA: Diagnosis not present

## 2021-02-13 DIAGNOSIS — E162 Hypoglycemia, unspecified: Secondary | ICD-10-CM | POA: Diagnosis not present

## 2021-02-13 DIAGNOSIS — I129 Hypertensive chronic kidney disease with stage 1 through stage 4 chronic kidney disease, or unspecified chronic kidney disease: Secondary | ICD-10-CM | POA: Diagnosis not present

## 2021-02-13 DIAGNOSIS — R339 Retention of urine, unspecified: Secondary | ICD-10-CM | POA: Diagnosis not present

## 2021-02-13 DIAGNOSIS — E785 Hyperlipidemia, unspecified: Secondary | ICD-10-CM | POA: Diagnosis not present

## 2021-02-13 DIAGNOSIS — I442 Atrioventricular block, complete: Secondary | ICD-10-CM | POA: Diagnosis not present

## 2021-02-13 DIAGNOSIS — R69 Illness, unspecified: Secondary | ICD-10-CM | POA: Diagnosis not present

## 2021-02-13 DIAGNOSIS — N1831 Chronic kidney disease, stage 3a: Secondary | ICD-10-CM | POA: Diagnosis not present

## 2021-02-14 ENCOUNTER — Inpatient Hospital Stay
Admission: EM | Admit: 2021-02-14 | Discharge: 2021-02-19 | DRG: 689 | Disposition: A | Discharge status: Home Health | Payer: Medicare HMO | Attending: Internal Medicine | Admitting: Internal Medicine

## 2021-02-14 ENCOUNTER — Other Ambulatory Visit: Payer: Self-pay

## 2021-02-14 ENCOUNTER — Observation Stay: Payer: Medicare HMO

## 2021-02-14 ENCOUNTER — Encounter: Payer: Self-pay | Admitting: Emergency Medicine

## 2021-02-14 ENCOUNTER — Emergency Department: Payer: Medicare HMO

## 2021-02-14 DIAGNOSIS — R9431 Abnormal electrocardiogram [ECG] [EKG]: Secondary | ICD-10-CM | POA: Diagnosis present

## 2021-02-14 DIAGNOSIS — Z79899 Other long term (current) drug therapy: Secondary | ICD-10-CM

## 2021-02-14 DIAGNOSIS — Z7982 Long term (current) use of aspirin: Secondary | ICD-10-CM | POA: Diagnosis not present

## 2021-02-14 DIAGNOSIS — J189 Pneumonia, unspecified organism: Secondary | ICD-10-CM

## 2021-02-14 DIAGNOSIS — N3000 Acute cystitis without hematuria: Secondary | ICD-10-CM | POA: Diagnosis not present

## 2021-02-14 DIAGNOSIS — R069 Unspecified abnormalities of breathing: Secondary | ICD-10-CM | POA: Diagnosis not present

## 2021-02-14 DIAGNOSIS — D137 Benign neoplasm of endocrine pancreas: Secondary | ICD-10-CM | POA: Diagnosis present

## 2021-02-14 DIAGNOSIS — R339 Retention of urine, unspecified: Secondary | ICD-10-CM | POA: Diagnosis not present

## 2021-02-14 DIAGNOSIS — I110 Hypertensive heart disease with heart failure: Secondary | ICD-10-CM | POA: Diagnosis present

## 2021-02-14 DIAGNOSIS — E162 Hypoglycemia, unspecified: Secondary | ICD-10-CM | POA: Diagnosis not present

## 2021-02-14 DIAGNOSIS — B961 Klebsiella pneumoniae [K. pneumoniae] as the cause of diseases classified elsewhere: Secondary | ICD-10-CM | POA: Diagnosis present

## 2021-02-14 DIAGNOSIS — J90 Pleural effusion, not elsewhere classified: Secondary | ICD-10-CM | POA: Diagnosis not present

## 2021-02-14 DIAGNOSIS — Z87440 Personal history of urinary (tract) infections: Secondary | ICD-10-CM

## 2021-02-14 DIAGNOSIS — Z880 Allergy status to penicillin: Secondary | ICD-10-CM

## 2021-02-14 DIAGNOSIS — G9340 Encephalopathy, unspecified: Secondary | ICD-10-CM | POA: Diagnosis not present

## 2021-02-14 DIAGNOSIS — Z9049 Acquired absence of other specified parts of digestive tract: Secondary | ICD-10-CM

## 2021-02-14 DIAGNOSIS — Z743 Need for continuous supervision: Secondary | ICD-10-CM | POA: Diagnosis not present

## 2021-02-14 DIAGNOSIS — R4182 Altered mental status, unspecified: Secondary | ICD-10-CM | POA: Diagnosis not present

## 2021-02-14 DIAGNOSIS — Z91013 Allergy to seafood: Secondary | ICD-10-CM

## 2021-02-14 DIAGNOSIS — E785 Hyperlipidemia, unspecified: Secondary | ICD-10-CM | POA: Diagnosis present

## 2021-02-14 DIAGNOSIS — E860 Dehydration: Secondary | ICD-10-CM | POA: Diagnosis present

## 2021-02-14 DIAGNOSIS — I442 Atrioventricular block, complete: Secondary | ICD-10-CM | POA: Diagnosis not present

## 2021-02-14 DIAGNOSIS — F0390 Unspecified dementia without behavioral disturbance: Secondary | ICD-10-CM | POA: Diagnosis present

## 2021-02-14 DIAGNOSIS — Z28310 Unvaccinated for covid-19: Secondary | ICD-10-CM

## 2021-02-14 DIAGNOSIS — I5032 Chronic diastolic (congestive) heart failure: Secondary | ICD-10-CM

## 2021-02-14 DIAGNOSIS — D509 Iron deficiency anemia, unspecified: Secondary | ICD-10-CM | POA: Diagnosis not present

## 2021-02-14 DIAGNOSIS — L89302 Pressure ulcer of unspecified buttock, stage 2: Secondary | ICD-10-CM | POA: Diagnosis present

## 2021-02-14 DIAGNOSIS — G9341 Metabolic encephalopathy: Secondary | ICD-10-CM | POA: Diagnosis present

## 2021-02-14 DIAGNOSIS — N179 Acute kidney failure, unspecified: Secondary | ICD-10-CM | POA: Diagnosis present

## 2021-02-14 DIAGNOSIS — J849 Interstitial pulmonary disease, unspecified: Secondary | ICD-10-CM | POA: Diagnosis not present

## 2021-02-14 DIAGNOSIS — D649 Anemia, unspecified: Secondary | ICD-10-CM | POA: Diagnosis present

## 2021-02-14 DIAGNOSIS — R69 Illness, unspecified: Secondary | ICD-10-CM | POA: Diagnosis not present

## 2021-02-14 DIAGNOSIS — F0392 Unspecified dementia, unspecified severity, with psychotic disturbance: Secondary | ICD-10-CM | POA: Diagnosis present

## 2021-02-14 DIAGNOSIS — R0602 Shortness of breath: Secondary | ICD-10-CM | POA: Diagnosis not present

## 2021-02-14 DIAGNOSIS — I129 Hypertensive chronic kidney disease with stage 1 through stage 4 chronic kidney disease, or unspecified chronic kidney disease: Secondary | ICD-10-CM | POA: Diagnosis not present

## 2021-02-14 DIAGNOSIS — N1831 Chronic kidney disease, stage 3a: Secondary | ICD-10-CM | POA: Diagnosis not present

## 2021-02-14 DIAGNOSIS — R338 Other retention of urine: Secondary | ICD-10-CM

## 2021-02-14 DIAGNOSIS — K449 Diaphragmatic hernia without obstruction or gangrene: Secondary | ICD-10-CM | POA: Diagnosis not present

## 2021-02-14 DIAGNOSIS — N390 Urinary tract infection, site not specified: Secondary | ICD-10-CM

## 2021-02-14 DIAGNOSIS — I10 Essential (primary) hypertension: Secondary | ICD-10-CM | POA: Diagnosis not present

## 2021-02-14 DIAGNOSIS — Z888 Allergy status to other drugs, medicaments and biological substances status: Secondary | ICD-10-CM

## 2021-02-14 DIAGNOSIS — Z20822 Contact with and (suspected) exposure to covid-19: Secondary | ICD-10-CM | POA: Diagnosis present

## 2021-02-14 DIAGNOSIS — L8990 Pressure ulcer of unspecified site, unspecified stage: Secondary | ICD-10-CM | POA: Diagnosis present

## 2021-02-14 DIAGNOSIS — R531 Weakness: Secondary | ICD-10-CM | POA: Diagnosis not present

## 2021-02-14 DIAGNOSIS — L89153 Pressure ulcer of sacral region, stage 3: Secondary | ICD-10-CM

## 2021-02-14 LAB — BASIC METABOLIC PANEL
Anion gap: 10 (ref 5–15)
BUN: 12 mg/dL (ref 8–23)
CO2: 26 mmol/L (ref 22–32)
Calcium: 8.1 mg/dL — ABNORMAL LOW (ref 8.9–10.3)
Chloride: 104 mmol/L (ref 98–111)
Creatinine, Ser: 1.26 mg/dL — ABNORMAL HIGH (ref 0.44–1.00)
GFR, Estimated: 43 mL/min — ABNORMAL LOW (ref >60)
Glucose, Bld: 146 mg/dL — ABNORMAL HIGH (ref 70–99)
Potassium: 3.7 mmol/L (ref 3.5–5.1)
Sodium: 140 mmol/L (ref 135–145)

## 2021-02-14 LAB — URINALYSIS, COMPLETE (UACMP) WITH MICROSCOPIC
Bilirubin Urine: NEGATIVE
Glucose, UA: NEGATIVE mg/dL
Hgb urine dipstick: NEGATIVE
Ketones, ur: NEGATIVE mg/dL
Nitrite: NEGATIVE
Protein, ur: NEGATIVE mg/dL
Specific Gravity, Urine: 1.014 (ref 1.005–1.030)
pH: 6 (ref 5.0–8.0)

## 2021-02-14 LAB — CBC WITH DIFFERENTIAL/PLATELET
Abs Immature Granulocytes: 0.04 10*3/uL (ref 0.00–0.07)
Basophils Absolute: 0.1 10*3/uL (ref 0.0–0.1)
Basophils Relative: 1 %
Eosinophils Absolute: 0.4 10*3/uL (ref 0.0–0.5)
Eosinophils Relative: 5 %
HCT: 35.9 % — ABNORMAL LOW (ref 36.0–46.0)
Hemoglobin: 11.5 g/dL — ABNORMAL LOW (ref 12.0–15.0)
Immature Granulocytes: 1 %
Lymphocytes Relative: 24 %
Lymphs Abs: 1.9 10*3/uL (ref 0.7–4.0)
MCH: 29.3 pg (ref 26.0–34.0)
MCHC: 32 g/dL (ref 30.0–36.0)
MCV: 91.6 fL (ref 80.0–100.0)
Monocytes Absolute: 0.6 10*3/uL (ref 0.1–1.0)
Monocytes Relative: 7 %
Neutro Abs: 5.2 10*3/uL (ref 1.7–7.7)
Neutrophils Relative %: 62 %
Platelets: 312 10*3/uL (ref 150–400)
RBC: 3.92 MIL/uL (ref 3.87–5.11)
RDW: 14.6 % (ref 11.5–15.5)
WBC: 8.2 10*3/uL (ref 4.0–10.5)
nRBC: 0 % (ref 0.0–0.2)

## 2021-02-14 LAB — CBC
HCT: 34.7 % — ABNORMAL LOW (ref 36.0–46.0)
Hemoglobin: 11.1 g/dL — ABNORMAL LOW (ref 12.0–15.0)
MCH: 29.3 pg (ref 26.0–34.0)
MCHC: 32 g/dL (ref 30.0–36.0)
MCV: 91.6 fL (ref 80.0–100.0)
Platelets: 295 10*3/uL (ref 150–400)
RBC: 3.79 MIL/uL — ABNORMAL LOW (ref 3.87–5.11)
RDW: 14.6 % (ref 11.5–15.5)
WBC: 7.7 10*3/uL (ref 4.0–10.5)
nRBC: 0 % (ref 0.0–0.2)

## 2021-02-14 LAB — COMPREHENSIVE METABOLIC PANEL
ALT: 13 U/L (ref 0–44)
AST: 36 U/L (ref 15–41)
Albumin: 3.2 g/dL — ABNORMAL LOW (ref 3.5–5.0)
Alkaline Phosphatase: 46 U/L (ref 38–126)
Anion gap: 12 (ref 5–15)
BUN: 12 mg/dL (ref 8–23)
CO2: 26 mmol/L (ref 22–32)
Calcium: 8.4 mg/dL — ABNORMAL LOW (ref 8.9–10.3)
Chloride: 101 mmol/L (ref 98–111)
Creatinine, Ser: 1.3 mg/dL — ABNORMAL HIGH (ref 0.44–1.00)
GFR, Estimated: 42 mL/min — ABNORMAL LOW (ref >60)
Glucose, Bld: 158 mg/dL — ABNORMAL HIGH (ref 70–99)
Potassium: 3.7 mmol/L (ref 3.5–5.1)
Sodium: 139 mmol/L (ref 135–145)
Total Bilirubin: 0.7 mg/dL (ref 0.3–1.2)
Total Protein: 6.6 g/dL (ref 6.5–8.1)

## 2021-02-14 LAB — TSH: TSH: 3.166 u[IU]/mL (ref 0.350–4.500)

## 2021-02-14 LAB — CBG MONITORING, ED: Glucose-Capillary: 140 mg/dL — ABNORMAL HIGH (ref 70–99)

## 2021-02-14 LAB — CREATININE, SERUM
Creatinine, Ser: 1.3 mg/dL — ABNORMAL HIGH (ref 0.44–1.00)
GFR, Estimated: 42 mL/min — ABNORMAL LOW (ref >60)

## 2021-02-14 LAB — MAGNESIUM: Magnesium: 1.9 mg/dL (ref 1.7–2.4)

## 2021-02-14 LAB — LACTIC ACID, PLASMA
Lactic Acid, Venous: 2 mmol/L (ref 0.5–1.9)
Lactic Acid, Venous: 1.4 mmol/L (ref 0.5–1.9)

## 2021-02-14 LAB — TROPONIN I (HIGH SENSITIVITY)
Troponin I (High Sensitivity): 16 ng/L (ref <18)
Troponin I (High Sensitivity): 16 ng/L (ref <18)

## 2021-02-14 LAB — SEDIMENTATION RATE: Sed Rate: 17 mm/hr (ref 0–30)

## 2021-02-14 LAB — RESP PANEL BY RT-PCR (FLU A&B, COVID) ARPGX2
Influenza A by PCR: NEGATIVE
Influenza B by PCR: NEGATIVE
SARS Coronavirus 2 by RT PCR: NEGATIVE

## 2021-02-14 LAB — BRAIN NATRIURETIC PEPTIDE: B Natriuretic Peptide: 136.4 pg/mL — ABNORMAL HIGH (ref 0.0–100.0)

## 2021-02-14 LAB — AMMONIA: Ammonia: 22 umol/L (ref 9–35)

## 2021-02-14 MED ORDER — ASPIRIN EC 81 MG PO TBEC
81.0000 mg | DELAYED_RELEASE_TABLET | Freq: Every day | ORAL | Status: DC
  Administered 2021-02-15 – 2021-02-19 (×5): 81 mg via ORAL
  Filled 2021-02-14 (×5): qty 1

## 2021-02-14 MED ORDER — OLANZAPINE 5 MG PO TABS
5.0000 mg | ORAL_TABLET | Freq: Every day | ORAL | Status: DC
  Administered 2021-02-15 – 2021-02-18 (×5): 5 mg via ORAL
  Filled 2021-02-14 (×6): qty 1

## 2021-02-14 MED ORDER — SODIUM CHLORIDE 0.9 % IV BOLUS
1000.0000 mL | Freq: Once | INTRAVENOUS | Status: AC
  Administered 2021-02-14: 1000 mL via INTRAVENOUS

## 2021-02-14 MED ORDER — ASCORBIC ACID 500 MG PO TABS: 500.0000 mg | ORAL_TABLET | Freq: Every day | ORAL | Status: DC

## 2021-02-14 MED ORDER — DIAZOXIDE 50 MG/ML PO SUSP
100.0000 mg | Freq: Three times a day (TID) | ORAL | Status: DC
  Administered 2021-02-15 – 2021-02-19 (×14): 100 mg via ORAL
  Filled 2021-02-14 (×15): qty 2

## 2021-02-14 MED ORDER — ONDANSETRON HCL 4 MG PO TABS: 4.0000 mg | ORAL_TABLET | Freq: Four times a day (QID) | ORAL | Status: DC | PRN

## 2021-02-14 MED ORDER — FERROUS SULFATE 325 (65 FE) MG PO TABS
325.0000 mg | ORAL_TABLET | Freq: Every day | ORAL | Status: DC
  Administered 2021-02-15 – 2021-02-19 (×5): 325 mg via ORAL
  Filled 2021-02-14 (×5): qty 1

## 2021-02-14 MED ORDER — TAMSULOSIN HCL 0.4 MG PO CAPS
0.4000 mg | ORAL_CAPSULE | Freq: Every day | ORAL | Status: DC
  Administered 2021-02-15 – 2021-02-19 (×5): 0.4 mg via ORAL
  Filled 2021-02-14 (×5): qty 1

## 2021-02-14 MED ORDER — SODIUM CHLORIDE 0.9 % IV SOLN
500.0000 mg | INTRAVENOUS | Status: DC
  Administered 2021-02-14 – 2021-02-15 (×2): 500 mg via INTRAVENOUS
  Filled 2021-02-14 (×3): qty 500

## 2021-02-14 MED ORDER — HEPARIN SODIUM (PORCINE) 5000 UNIT/ML IJ SOLN
5000.0000 [IU] | Freq: Three times a day (TID) | INTRAMUSCULAR | Status: DC
  Administered 2021-02-15: 02:00:00 5000 [IU] via SUBCUTANEOUS
  Filled 2021-02-14: qty 1

## 2021-02-14 MED ORDER — ONDANSETRON HCL 4 MG/2ML IJ SOLN: 4.0000 mg | Freq: Four times a day (QID) | INTRAMUSCULAR | Status: DC | PRN

## 2021-02-14 MED ORDER — ROSUVASTATIN CALCIUM 10 MG PO TABS
10.0000 mg | ORAL_TABLET | Freq: Every day | ORAL | Status: DC
  Administered 2021-02-15 – 2021-02-19 (×6): 10 mg via ORAL
  Filled 2021-02-14 (×6): qty 1

## 2021-02-14 MED ORDER — GLUCOSE-VITAMIN C 4-6 GM-MG PO CHEW
1.0000 | CHEWABLE_TABLET | Freq: Two times a day (BID) | ORAL | Status: DC
  Administered 2021-02-15 – 2021-02-19 (×9): 1 via ORAL
  Filled 2021-02-14 (×4): qty 1

## 2021-02-14 MED ORDER — SODIUM CHLORIDE 0.9 % IV SOLN
1.0000 g | Freq: Once | INTRAVENOUS | Status: AC
  Administered 2021-02-14: 1 g via INTRAVENOUS
  Filled 2021-02-14: qty 10

## 2021-02-14 MED ORDER — ACETAMINOPHEN 325 MG PO TABS: 650.0000 mg | ORAL_TABLET | ORAL | Status: DC | PRN

## 2021-02-14 MED ORDER — DEXTROSE IN LACTATED RINGERS 5 % IV SOLN: INTRAVENOUS | Status: DC

## 2021-02-14 MED ORDER — MEMANTINE HCL ER 28 MG PO CP24
28.0000 mg | ORAL_CAPSULE | Freq: Every day | ORAL | Status: DC
  Administered 2021-02-15 – 2021-02-18 (×4): 28 mg via ORAL
  Filled 2021-02-14 (×6): qty 1

## 2021-02-14 NOTE — ED Notes (Signed)
MD at bedside. 

## 2021-02-14 NOTE — H&P (Signed)
History and Physical    Alexis Rodriguez BHA:[id number] DOB: [birth year] DOA: 02/14/2021  PCP: Perrin Maltese, MD  [facility]  I have personally briefly reviewed patient's old medical records in [place]  Chief Complaint:   HPI: Alexis Rodriguez is a 80 y.o. 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 At that time she was deemed a poor operative candidate and medical management with diaxozide solution was opted for. Of note patients glucose has remained stable on this treatment. Patient now presents to ed with increase lethargy and confusion. Per daughter who gives history, patient had cough and congestion that started 4 days ago. She states since the although congestion has improved, patient since then has been progressively more lethargic and confused. Per family patient usually presents with acute confusion /lethargy from baseline in setting of infection. Patient currently notes no chest pain, sob, dysuria, but notes lower abdominal tenderness without n/v/dysuria or constipation. She does note feeling of fatigue and increase lethargy.No fever's or chills.  Per daughter everyone in household had similar symptoms.   ED Course: 98.8, bp 141/61,hr 87, rr18 sat 94% Wbc:8.2 , hb 11.5 stable, [id number] NA 139, K 3.7, glu 158, cr 1.3 prior 0.8 Lactic 2.0 UA: +bacteria, + wbc, XBD:[id number] thickening and interstitial prominence, likely bronchitic changes or atypical infection.  tx ctx,ivfs  Review of Systems: As per HPI otherwise 10 point review of systems negative.   Past Medical History:  Diagnosis Date  . Dementia ([place])   . Hypertension   . Pancreatitis     Past Surgical History:  Procedure Laterality Date  . CHOLECYSTECTOMY       reports that she has never smoked. She has never used smokeless tobacco. She reports  that she does not drink alcohol and does not use drugs.  Allergies  Allergen Reactions  . Donepezil Hcl Other (See Comments)    Bradycardia and heart block!!!  . Shellfish Allergy Anaphylaxis  . Penicillins     Other reaction(s): Unknown    Family History  Problem Relation Age of Onset  . Diabetes Mellitus II Mother   . Atrial fibrillation Sister     Prior to Admission medications   Medication Sig Start Date End Date Taking? Authorizing Provider  acetaminophen (TYLENOL) 325 MG tablet Take 2 tablets (650 mg total) by mouth every 4 (four) hours as needed for mild pain (or temp > 37.5 C (99.5 F)). 11/22/20   Allie Bossier, MD  ascorbic acid (VITAMIN C) 500 MG tablet Take 1 tablet (500 mg total) by mouth daily. 11/23/20   Allie Bossier, MD  aspirin EC 81 MG EC tablet Take 1 tablet (81 mg total) by mouth daily. Swallow whole. 11/23/20   Allie Bossier, MD  ferrous sulfate 325 (65 FE) MG tablet Take 325 mg by mouth daily. 10/21/20   [provider]  glucose 4 GM chewable tablet Chew 1 tablet (4 g total) by mouth 2 (two) times daily. 11/22/20   Allie Bossier, MD  Magnesium 250 MG TABS Take 250 mg by mouth daily.    [provider]  melatonin 5 MG TABS Take 5 mg by mouth at bedtime.    [provider]  memantine (NAMENDA XR) 28 MG CP24 24 hr capsule Take 1 capsule (28 mg total) by mouth at bedtime. 11/22/20   Allie Bossier, MD  OLANZapine (ZYPREXA) 5 MG tablet Take 5 mg by mouth at bedtime. 10/21/20   [provider]  rosuvastatin (CRESTOR) 10 MG tablet Take 1 tablet (10 mg total) by mouth daily. 11/23/20   Allie Bossier, MD  tamsulosin (FLOMAX) 0.4 MG CAPS capsule Take 1 capsule (0.4 mg total) by mouth daily. 11/23/20   Allie Bossier, MD    Physical Exam: Vitals:   02/14/21 1803 02/14/21 1805 02/14/21 1900 02/14/21 2000  BP: (!) 141/61  (!) 140/59 (!) 115/59  Pulse: 87  83 77  Resp: 18  18 18   Temp: 98.8 F (37.1 C)     TempSrc: Oral     SpO2: 94%   98% 99%  Weight:  78 kg    Height:  5\' 3"  (1.6 m)       Vitals:   02/14/21 1803 02/14/21 1805 02/14/21 1900 02/14/21 2000  BP: (!) 141/61  (!) 140/59 (!) 115/59  Pulse: 87  83 77  Resp: 18  18 18   Temp: 98.8 F (37.1 C)     TempSrc: Oral     SpO2: 94%  98% 99%  Weight:  78 kg    Height:  5\' 3"  (1.6 m)    Constitutional: NAD, calm, comfortable Eyes: PERRL, lids and conjunctivae normal ENMT: Mucous membranes are moist. Posterior pharynx clear of any exudate or lesions.Normal dentition.  Neck: normal, supple, no masses, no thyromegaly Respiratory: clear to auscultation bilaterally, no wheezing, no crackles. Normal respiratory effort. No accessory muscle use.  Cardiovascular: Regular rate and rhythm, no murmurs / rubs / gallops. + extremity edema. 2+ pedal pulses. No carotid bruits.  Abdomen: lower abd tenderness, no masses palpated. No hepatosplenomegaly. Bowel sounds positive.  Musculoskeletal: no clubbing / cyanosis. No joint deformity upper and lower extremities. Good ROM, no contractures. Normal muscle tone.  Skin: no rashes, lesions, ulcers. No induration Neurologic: CN 2-12 grossly intact. Sensation intact, DTR normal. Strength 5/5 in all 4.  Psychiatric: Normal judgment and insight. Alert and oriented x 3. Normal mood.    Labs on Admission: I have personally reviewed following labs and imaging studies  CBC: Recent Labs  Lab 02/14/21 1811  WBC 8.2  NEUTROABS 5.2  HGB 11.5*  HCT 35.9*  MCV 91.6  PLT 619   Basic Metabolic Panel: Recent Labs  Lab 02/14/21 1811  NA 139  K 3.7  CL 101  CO2 26  GLUCOSE 158*  BUN 12  CREATININE 1.30*  CALCIUM 8.4*   GFR: Estimated Creatinine Clearance: 34.1 mL/min (A) (by C-G formula based on SCr of 1.3 mg/dL (H)). Liver Function Tests: Recent Labs  Lab 02/14/21 1811  AST 36  ALT 13  ALKPHOS 46  BILITOT 0.7  PROT 6.6  ALBUMIN 3.2*   No results for input(s): LIPASE, AMYLASE in the last 168 hours. No results for  input(s): AMMONIA in the last 168 hours. Coagulation Profile: No results for input(s): INR, PROTIME in the last 168 hours. Cardiac Enzymes: No results for input(s): CKTOTAL, CKMB, CKMBINDEX, TROPONINI in the last 168 hours. BNP (last 3 results) No results for input(s): PROBNP in the last 8760 hours. HbA1C: No results for input(s): HGBA1C in the last 72 hours. CBG: No results for input(s): GLUCAP in the last 168 hours. Lipid Profile: No results for input(s): CHOL, HDL, LDLCALC, TRIG, CHOLHDL, LDLDIRECT in the last 72 hours. Thyroid Function Tests: No results for input(s): TSH, T4TOTAL, FREET4, T3FREE, THYROIDAB in the last 72 hours. Anemia Panel: No results for input(s): VITAMINB12,  FOLATE, FERRITIN, TIBC, IRON, RETICCTPCT in the last 72 hours. Urine analysis:    Component Value Date/Time   COLORURINE YELLOW (A) 02/14/2021 1812   APPEARANCEUR HAZY (A) 02/14/2021 1812   APPEARANCEUR TURBID 03/03/2015 1638   LABSPEC 1.014 02/14/2021 1812   LABSPEC 1.023 03/03/2015 1638   PHURINE 6.0 02/14/2021 1812   GLUCOSEU NEGATIVE 02/14/2021 1812   GLUCOSEU see comment 03/03/2015 1638   HGBUR NEGATIVE 02/14/2021 1812   BILIRUBINUR NEGATIVE 02/14/2021 1812   BILIRUBINUR see comment 03/03/2015 [place] 02/14/2021 1812   PROTEINUR NEGATIVE 02/14/2021 1812   NITRITE NEGATIVE 02/14/2021 1812   LEUKOCYTESUR SMALL (A) 02/14/2021 1812   LEUKOCYTESUR see comment 03/03/2015 1638    Radiological Exams on Admission: DG Chest Portable 1 View  Result Date: 02/14/2021 CLINICAL DATA:  Weakness EXAM: PORTABLE CHEST 1 VIEW COMPARISON:  11/10/2020 FINDINGS: Peribronchial thickening and interstitial prominence. Heart is normal size. No effusions. No acute bony abnormality. IMPRESSION: Peribronchial thickening and interstitial prominence, likely bronchitic changes or atypical infection. Electronically Signed   By: Rolm Baptise M.D.   On: 02/14/2021 19:02    EKG: Independently reviewed.    Assessment/Plan Acute Metabolic Encephalopathy  -CT head to be complete ,as well as ammonia pending -thought primarily due to infection /dehydration  -gently ivfs ,abx   Possible CAP --respiratory panel pending  -further imaging CT Thorax  - urinary ag, blood cultures  -ctx/azithromycin for now de-escalate anti-biotics as able  -check inflammatory markers  -patient current afebrile no wbc count , but does have ? Change on cxr and increase lactate  AKI -poor intake /diueretic  -hold lasix 20 mg po daily  -resume if patient can tolerate per d/c summary and pcp note  Patient has poor po intake and so daily lasix was not thought to be needed,  -per daughter patient has been on lasix for the last 10 days - prn based a sliding scale may be of benefit  - gently ivfs  -hold nephrotoxic medications -strict I/o   UTI  -ctx  -f/u on culture data   Insulinoma  -continue on  diaxozide solution  -monitor finger sticks  -strict I/o , daily weights  Due to risk factor for fluid overload with diaxozide -prn hypoglycemic protocol  -no insulin for sugars less than 400   Urinary retention with lower abd pain distend bladder on exam -bladder scan now and q8h with cic prn  -cont flomax  -patient not able to tolerate indwelling foley due to dementia and fulling at wires.   Lower extremity edema  -most likely related to diaxozide  - check bnp , echo to be complete  -of note cxr w/o fluid overload or lung exam  Dementia: -resume home regimen   Anemia -stable  -monitor labs   HLD -continue statin    DVT prophylaxis:  Heparin  Code Status: FULL Family Communication: daughter at beside Disposition Plan:patient  expected to be admitted greater than 2 midnights Consults called:n/a Admission status: inpatient    Clance Boll MD [facility]   If 7PM-7AM, please contact night-coverage [email or web address] Password [facility]  02/14/2021, 8:06 PM

## 2021-02-14 NOTE — Progress Notes (Signed)
Per daughter, patient takes Lasix 20mg  and 46meq of potassium once a day. Orders are not in chart, requesting medications to be ordered.

## 2021-02-14 NOTE — ED Triage Notes (Signed)
Patient arrives via EMS from home for weakness since Tuesday. Patient was being seen by home health nurse and seemed to be getting increasingly weaker. Patient has known ulcer on sacrum and a history of dementia.

## 2021-02-14 NOTE — Plan of Care (Signed)
  Problem: Health Behavior/Discharge Planning: Goal: Ability to manage health-related needs will improve Outcome: Progressing   Problem: Safety: Goal: Ability to remain free from injury will improve Outcome: Progressing   Problem: Skin Integrity: Goal: Risk for impaired skin integrity will decrease Outcome: Progressing   

## 2021-02-14 NOTE — ED Provider Notes (Signed)
[facility] Emergency Department Provider Note  ____________________________________________   Event Date/Time   First MD Initiated Contact with Patient 02/14/21 1803     (approximate)  I have reviewed the triage vital signs and the nursing notes.   HISTORY  Chief Complaint Weakness    HPI Alexis Rodriguez is a 80 y.o. female with history of hypertension, dementia, here with generalized weakness.  History is somewhat limited due to dementia.  Per report, ever, patient has been increasingly weak and wanted to eat or drink for the last several days.  She has had slightly increased cough.  On my assessment, she denies any complaints although history limited due to dementia.  She does state that she has a sore on her bottom which has been painful.  She has a known sacral ulcer that developed on her gluteal cleft due to recent SNF stay.  Remainder of history limited due to dementia.  Level 5 caveat invoked as remainder of history, ROS, and physical exam limited due to patient's dementia.         Past Medical History:  Diagnosis Date  . Dementia ([place])   . Hypertension   . Pancreatitis     Patient Active Problem List   Diagnosis Date Noted  . Encephalopathy acute 02/14/2021  . Heart block   . Third degree AV block ([place])   . AKI (acute kidney injury) ([place])   . Pressure injury of skin 11/06/2020  . Stroke-like symptoms 11/04/2020  . Depression 11/04/2020  . HTN (hypertension)   . Hypoglycemia   . Dementia ([place])   . HLD (hyperlipidemia)   . Iron deficiency anemia   . CKD (chronic kidney disease), stage IIIa     Past Surgical History:  Procedure Laterality Date  . CHOLECYSTECTOMY      Prior to Admission medications   Medication Sig Start Date End Date Taking? Authorizing Provider  acetaminophen (TYLENOL) 325 MG tablet Take 2 tablets (650 mg total) by mouth every 4 (four) hours as needed for mild pain (or temp > 37.5 C (99.5 F)). 11/22/20   Allie Bossier, MD  ascorbic acid (VITAMIN C) 500 MG tablet Take 1 tablet (500 mg total) by mouth daily. 11/23/20   Allie Bossier, MD  aspirin EC 81 MG EC tablet Take 1 tablet (81 mg total) by mouth daily. Swallow whole. 11/23/20   Allie Bossier, MD  ferrous sulfate 325 (65 FE) MG tablet Take 325 mg by mouth daily. 10/21/20   [provider]  glucose 4 GM chewable tablet Chew 1 tablet (4 g total) by mouth 2 (two) times daily. 11/22/20   Allie Bossier, MD  Magnesium 250 MG TABS Take 250 mg by mouth daily.    [provider]  melatonin 5 MG TABS Take 5 mg by mouth at bedtime.    [provider]  memantine (NAMENDA XR) 28 MG CP24 24 hr capsule Take 1 capsule (28 mg total) by mouth at bedtime. 11/22/20   Allie Bossier, MD  OLANZapine (ZYPREXA) 5 MG tablet Take 5 mg by mouth at bedtime. 10/21/20   [provider]  rosuvastatin (CRESTOR) 10 MG tablet Take 1 tablet (10 mg total) by mouth daily. 11/23/20   Allie Bossier, MD  tamsulosin (FLOMAX) 0.4 MG CAPS capsule Take 1 capsule (0.4 mg total) by mouth daily. 11/23/20   Allie Bossier, MD    Allergies Donepezil hcl, Shellfish allergy, and Penicillins  Family History  Problem Relation  Age of Onset  . Diabetes Mellitus II Mother   . Atrial fibrillation Sister     Social History Social History   Tobacco Use  . Smoking status: Never Smoker  . Smokeless tobacco: Never Used  Substance Use Topics  . Alcohol use: Never  . Drug use: Never    Review of Systems  Review of Systems  Unable to perform ROS: Dementia  Skin: Positive for wound.  Neurological: Positive for weakness.     ____________________________________________  PHYSICAL EXAM:      VITAL SIGNS: ED Triage Vitals  Enc Vitals Group     BP 02/14/21 1803 (!) 141/61     Pulse Rate 02/14/21 1803 87     Resp 02/14/21 1803 18     Temp 02/14/21 1803 98.8 F (37.1 C)     Temp Source 02/14/21 1803 Oral     SpO2 02/14/21 1800 97 %     Weight 02/14/21 1805  171 lb 15.3 oz (78 kg)     Height 02/14/21 1805 5\' 3"  (1.6 m)     Head Circumference --      [facility] --      Pain Score --      Pain Loc --      Pain Edu? --      Excl. in [place]? --      Physical Exam Vitals and nursing note reviewed.  Constitutional:      General: She is not in acute distress.    Appearance: She is well-developed.  HENT:     Head: Normocephalic and atraumatic.  Eyes:     Conjunctiva/sclera: Conjunctivae normal.  Cardiovascular:     Rate and Rhythm: Normal rate and regular rhythm.     Heart sounds: Normal heart sounds.  Pulmonary:     Effort: Pulmonary effort is normal. No respiratory distress.     Breath sounds: No wheezing.  Abdominal:     General: There is no distension.  Musculoskeletal:     Cervical back: Neck supple.  Skin:    General: Skin is warm.     Capillary Refill: Capillary refill takes less than 2 seconds.     Findings: No rash.     Comments: Stage III decub ulcer noted, no warmth, no erythema  Neurological:     Mental Status: She is alert. She is disoriented.     Motor: No abnormal muscle tone.       ____________________________________________   LABS (all labs ordered are listed, but only abnormal results are displayed)  Labs Reviewed  CBC WITH DIFFERENTIAL/PLATELET - Abnormal; Notable for the following components:      Result Value   Hemoglobin 11.5 (*)    HCT 35.9 (*)    All other components within normal limits  COMPREHENSIVE METABOLIC PANEL - Abnormal; Notable for the following components:   Glucose, Bld 158 (*)    Creatinine, Ser 1.30 (*)    Calcium 8.4 (*)    Albumin 3.2 (*)    GFR, Estimated 42 (*)    All other components within normal limits  URINALYSIS, COMPLETE (UACMP) WITH MICROSCOPIC - Abnormal; Notable for the following components:   Color, Urine YELLOW (*)    APPearance HAZY (*)    Leukocytes,Ua SMALL (*)    Bacteria, UA MANY (*)    All other components within normal limits  LACTIC ACID, PLASMA - Abnormal;  Notable for the following components:   Lactic Acid, Venous 2.0 (*)    All other components within  normal limits  URINE CULTURE  RESP PANEL BY RT-PCR (FLU A&B, COVID) ARPGX2  URINE CULTURE  EXPECTORATED SPUTUM ASSESSMENT W GRAM STAIN, RFLX TO RESP C  LACTIC ACID, PLASMA  MAGNESIUM  CBC  CREATININE, SERUM  TSH  COMPREHENSIVE METABOLIC PANEL  CBC  AMMONIA  SEDIMENTATION RATE  C-REACTIVE PROTEIN  TROPONIN I (HIGH SENSITIVITY)  TROPONIN I (HIGH SENSITIVITY)    ____________________________________________  EKG: Normal sinus rhythm, jugular rate 87.  PR 166, QRS 119, QTc 495.  No acute ST elevations or depressions. ________________________________________  RADIOLOGY All imaging, including plain films, CT scans, and ultrasounds, independently reviewed by me, and interpretations confirmed via formal radiology reads.  ED MD interpretation:   Chest x-ray: Peribronchial thickening and interstitial prominence  Official radiology report(s): DG Chest Portable 1 View  Result Date: 02/14/2021 CLINICAL DATA:  Weakness EXAM: PORTABLE CHEST 1 VIEW COMPARISON:  11/10/2020 FINDINGS: Peribronchial thickening and interstitial prominence. Heart is normal size. No effusions. No acute bony abnormality. IMPRESSION: Peribronchial thickening and interstitial prominence, likely bronchitic changes or atypical infection. Electronically Signed   By: Rolm Baptise M.D.   On: 02/14/2021 19:02    ____________________________________________  PROCEDURES   Procedure(s) performed (including Critical Care):  Procedures  ____________________________________________  INITIAL IMPRESSION / MDM / [place] / ED COURSE  As part of my medical decision making, I reviewed the following data within the [place] notes reviewed and incorporated, Old chart reviewed, Notes from prior ED visits, and [place] Controlled Substance Database       *Maekayla Giorgio was evaluated in Emergency  Department on 02/14/2021 for the symptoms described in the history of present illness. She was evaluated in the context of the global COVID-19 pandemic, which necessitated consideration that the patient might be at risk for infection with the SARS-CoV-2 virus that causes COVID-19. Institutional protocols and algorithms that pertain to the evaluation of patients at risk for COVID-19 are in a state of rapid change based on information released by regulatory bodies including the CDC and federal and state organizations. These policies and algorithms were followed during the patient's care in the ED.  Some ED evaluations and interventions may be delayed as a result of limited staffing during the pandemic.*     Medical Decision Making:  80 yo F here with generalized weakness. H/o recent admission at [facility] for insulinoma, s/p stay at SNF now returned home x 1.5 weeks. Suspect recurrent encephalopathy 2/2 UTI. No signs of sepsis. Pt afebrile, non toxic. LA 2 but ilkely from poor PO intake. Will give Rocephin, fluids, admit. No focal deficits. No falls. Pt does have stage III decub ulcer but no signs of infection - recommend wound consult inpatient.   ____________________________________________  FINAL CLINICAL IMPRESSION(S) / ED DIAGNOSES  Final diagnoses:  Encephalopathy  Acute cystitis without hematuria  Pressure injury of sacral region, stage 3 (HCC)     MEDICATIONS GIVEN DURING THIS VISIT:  Medications  diazoxide (PROGLYCEM) 50 MG/ML suspension 100 mg (has no administration in time range)  aspirin EC tablet 81 mg (has no administration in time range)  acetaminophen (TYLENOL) tablet 650 mg (has no administration in time range)  rosuvastatin (CRESTOR) tablet 10 mg (has no administration in time range)  memantine (NAMENDA XR) 24 hr capsule 28 mg (has no administration in time range)  OLANZapine (ZYPREXA) tablet 5 mg (has no administration in time range)  glucose chewable tablet 4 g (has no  administration in time range)  tamsulosin (FLOMAX) capsule 0.4 mg (  has no administration in time range)  ferrous sulfate tablet 325 mg (has no administration in time range)  ascorbic acid (VITAMIN C) tablet 500 mg (has no administration in time range)  heparin injection 5,000 Units (has no administration in time range)  dextrose 5 % in lactated ringers infusion (has no administration in time range)  ondansetron (ZOFRAN) tablet 4 mg (has no administration in time range)    Or  ondansetron (ZOFRAN) injection 4 mg (has no administration in time range)  sodium chloride 0.9 % bolus 1,000 mL (0 mLs Intravenous Stopped 02/14/21 2039)  cefTRIAXone (ROCEPHIN) 1 g in sodium chloride 0.9 % 100 mL IVPB (0 g Intravenous Stopped 02/14/21 2026)     ED Discharge Orders    None       Note:  This document was prepared using Dragon voice recognition software and may include unintentional dictation errors.   Duffy Bruce, MD 02/14/21 2040

## 2021-02-15 ENCOUNTER — Observation Stay (HOSPITAL_COMMUNITY)
Admit: 2021-02-15 | Discharge: 2021-02-15 | Disposition: A | Discharge status: Home / Self Care | Payer: Medicare HMO | Attending: Internal Medicine | Admitting: Internal Medicine

## 2021-02-15 DIAGNOSIS — Z515 Encounter for palliative care: Secondary | ICD-10-CM | POA: Diagnosis not present

## 2021-02-15 DIAGNOSIS — G9340 Encephalopathy, unspecified: Secondary | ICD-10-CM | POA: Diagnosis not present

## 2021-02-15 DIAGNOSIS — D649 Anemia, unspecified: Secondary | ICD-10-CM | POA: Diagnosis present

## 2021-02-15 DIAGNOSIS — I428 Other cardiomyopathies: Secondary | ICD-10-CM

## 2021-02-15 DIAGNOSIS — N3000 Acute cystitis without hematuria: Secondary | ICD-10-CM | POA: Diagnosis not present

## 2021-02-15 DIAGNOSIS — R69 Illness, unspecified: Secondary | ICD-10-CM | POA: Diagnosis not present

## 2021-02-15 DIAGNOSIS — B961 Klebsiella pneumoniae [K. pneumoniae] as the cause of diseases classified elsewhere: Secondary | ICD-10-CM | POA: Diagnosis not present

## 2021-02-15 DIAGNOSIS — F0390 Unspecified dementia without behavioral disturbance: Secondary | ICD-10-CM | POA: Diagnosis present

## 2021-02-15 DIAGNOSIS — G9341 Metabolic encephalopathy: Secondary | ICD-10-CM | POA: Diagnosis not present

## 2021-02-15 DIAGNOSIS — E785 Hyperlipidemia, unspecified: Secondary | ICD-10-CM | POA: Diagnosis not present

## 2021-02-15 DIAGNOSIS — Z28310 Unvaccinated for covid-19: Secondary | ICD-10-CM | POA: Diagnosis not present

## 2021-02-15 DIAGNOSIS — M6281 Muscle weakness (generalized): Secondary | ICD-10-CM | POA: Diagnosis not present

## 2021-02-15 DIAGNOSIS — R5381 Other malaise: Secondary | ICD-10-CM | POA: Diagnosis not present

## 2021-02-15 DIAGNOSIS — E860 Dehydration: Secondary | ICD-10-CM | POA: Diagnosis present

## 2021-02-15 DIAGNOSIS — L89302 Pressure ulcer of unspecified buttock, stage 2: Secondary | ICD-10-CM | POA: Diagnosis present

## 2021-02-15 DIAGNOSIS — I5032 Chronic diastolic (congestive) heart failure: Secondary | ICD-10-CM | POA: Diagnosis not present

## 2021-02-15 DIAGNOSIS — R9431 Abnormal electrocardiogram [ECG] [EKG]: Secondary | ICD-10-CM | POA: Diagnosis present

## 2021-02-15 DIAGNOSIS — Z9049 Acquired absence of other specified parts of digestive tract: Secondary | ICD-10-CM | POA: Diagnosis not present

## 2021-02-15 DIAGNOSIS — R531 Weakness: Secondary | ICD-10-CM | POA: Diagnosis not present

## 2021-02-15 DIAGNOSIS — I110 Hypertensive heart disease with heart failure: Secondary | ICD-10-CM | POA: Diagnosis present

## 2021-02-15 DIAGNOSIS — Z7189 Other specified counseling: Secondary | ICD-10-CM | POA: Diagnosis not present

## 2021-02-15 DIAGNOSIS — R279 Unspecified lack of coordination: Secondary | ICD-10-CM | POA: Diagnosis not present

## 2021-02-15 DIAGNOSIS — I10 Essential (primary) hypertension: Secondary | ICD-10-CM | POA: Diagnosis not present

## 2021-02-15 DIAGNOSIS — D137 Benign neoplasm of endocrine pancreas: Secondary | ICD-10-CM | POA: Diagnosis present

## 2021-02-15 DIAGNOSIS — Z87440 Personal history of urinary (tract) infections: Secondary | ICD-10-CM | POA: Diagnosis not present

## 2021-02-15 DIAGNOSIS — N179 Acute kidney failure, unspecified: Secondary | ICD-10-CM | POA: Diagnosis not present

## 2021-02-15 DIAGNOSIS — Z20822 Contact with and (suspected) exposure to covid-19: Secondary | ICD-10-CM | POA: Diagnosis not present

## 2021-02-15 DIAGNOSIS — J189 Pneumonia, unspecified organism: Secondary | ICD-10-CM | POA: Diagnosis not present

## 2021-02-15 LAB — COMPREHENSIVE METABOLIC PANEL
ALT: 12 U/L (ref 0–44)
AST: 28 U/L (ref 15–41)
Albumin: 3 g/dL — ABNORMAL LOW (ref 3.5–5.0)
Alkaline Phosphatase: 37 U/L — ABNORMAL LOW (ref 38–126)
Anion gap: 9 (ref 5–15)
BUN: 11 mg/dL (ref 8–23)
CO2: 24 mmol/L (ref 22–32)
Calcium: 8.2 mg/dL — ABNORMAL LOW (ref 8.9–10.3)
Chloride: 105 mmol/L (ref 98–111)
Creatinine, Ser: 1.11 mg/dL — ABNORMAL HIGH (ref 0.44–1.00)
GFR, Estimated: 50 mL/min — ABNORMAL LOW (ref >60)
Glucose, Bld: 138 mg/dL — ABNORMAL HIGH (ref 70–99)
Potassium: 3.7 mmol/L (ref 3.5–5.1)
Sodium: 138 mmol/L (ref 135–145)
Total Bilirubin: 0.6 mg/dL (ref 0.3–1.2)
Total Protein: 6.2 g/dL — ABNORMAL LOW (ref 6.5–8.1)

## 2021-02-15 LAB — CBC
HCT: 35.1 % — ABNORMAL LOW (ref 36.0–46.0)
Hemoglobin: 11.3 g/dL — ABNORMAL LOW (ref 12.0–15.0)
MCH: 29.3 pg (ref 26.0–34.0)
MCHC: 32.2 g/dL (ref 30.0–36.0)
MCV: 90.9 fL (ref 80.0–100.0)
Platelets: 297 10*3/uL (ref 150–400)
RBC: 3.86 MIL/uL — ABNORMAL LOW (ref 3.87–5.11)
RDW: 14.6 % (ref 11.5–15.5)
WBC: 6.8 10*3/uL (ref 4.0–10.5)
nRBC: 0 % (ref 0.0–0.2)

## 2021-02-15 LAB — GLUCOSE, CAPILLARY
Glucose-Capillary: 130 mg/dL — ABNORMAL HIGH (ref 70–99)
Glucose-Capillary: 158 mg/dL — ABNORMAL HIGH (ref 70–99)
Glucose-Capillary: 153 mg/dL — ABNORMAL HIGH (ref 70–99)
Glucose-Capillary: 122 mg/dL — ABNORMAL HIGH (ref 70–99)

## 2021-02-15 LAB — C-REACTIVE PROTEIN: CRP: 0.5 mg/dL (ref <1.0)

## 2021-02-15 LAB — ECHOCARDIOGRAM COMPLETE
AR max vel: 1.48 cm2
AV Area VTI: 1.28 cm2
AV Area mean vel: 1.24 cm2
AV Mean grad: 9.5 mmHg
AV Peak grad: 17 mmHg
Ao pk vel: 2.06 m/s
Area-P 1/2: 6.12 cm2
Height: 63 in
S' Lateral: 2.79 cm
Weight: 2973.56 oz

## 2021-02-15 MED ORDER — CHLORHEXIDINE GLUCONATE CLOTH 2 % EX PADS
6.0000 | MEDICATED_PAD | Freq: Every day | CUTANEOUS | Status: DC
  Administered 2021-02-15 – 2021-02-19 (×5): 6 via TOPICAL

## 2021-02-15 MED ORDER — FUROSEMIDE 20 MG PO TABS
20.0000 mg | ORAL_TABLET | Freq: Every day | ORAL | Status: DC
  Administered 2021-02-15 – 2021-02-18 (×4): 20 mg via ORAL
  Filled 2021-02-15 (×4): qty 1

## 2021-02-15 MED ORDER — SODIUM CHLORIDE 0.9 % IV SOLN
1.0000 g | INTRAVENOUS | Status: AC
  Administered 2021-02-15 – 2021-02-18 (×4): 1 g via INTRAVENOUS
  Filled 2021-02-15: qty 1
  Filled 2021-02-15 (×2): qty 10
  Filled 2021-02-15: qty 1

## 2021-02-15 MED ORDER — HEPARIN SODIUM (PORCINE) 5000 UNIT/ML IJ SOLN
5000.0000 [IU] | Freq: Three times a day (TID) | INTRAMUSCULAR | Status: DC
  Administered 2021-02-15 – 2021-02-19 (×12): 5000 [IU] via SUBCUTANEOUS
  Filled 2021-02-15 (×10): qty 1

## 2021-02-15 MED ORDER — LORAZEPAM 2 MG/ML IJ SOLN
1.0000 mg | Freq: Once | INTRAMUSCULAR | Status: AC
  Administered 2021-02-15: 1 mg via INTRAVENOUS
  Filled 2021-02-15: qty 1

## 2021-02-15 NOTE — Progress Notes (Addendum)
[facility]  PROGRESS NOTE  Alexis Rodriguez ALP:[id number] DOB: 25-Aug-1941 DOA: 02/14/2021 PCP: Perrin Maltese, MD   Brief HPI:   80 year old female with history of dementia, hypertension, hyperlipidemia, recurrent UTIs who was recently diagnosed with insulinoma status post refractory hypoglycemia on recent admission 12/2220 at [facility].  At that time she was deemed a poor candidate for surgery and medical management with dioxozide solution was opted for.  Patient's blood glucose has remained stable on this treatment.  She presented to the ED with increased lethargy and confusion.  Patient had 4 days of cough and congestion consistent she has become progressively lethargic and confused.    Subjective   Patient seen and examined, continues to be confused.   Assessment/Plan:     1. Metabolic encephalopathy-CT head is unremarkable, ammonia level 22.  Patient's encephalopathy is likely from pneumonia in the setting of advanced dementia.  We will continue to treat the pneumonia with ceftriaxone and Zithromax.  This morning patient pulled out her Foley catheter requiring IV Ativan.  We will continue to treat her encephalopathy with Ativan, will avoid Haldol due to prolonged QTC control. 2. Interstitial pneumonia-seen on CT chest, started on antibiotics as above.  Will obtain blood cultures x2. 3. Acute kidney injury-renal function is improved, Lasix was held on admission.  Avoid nephrotoxic medications. 4. Urinary retention-Foley catheter in place.  Started on Flomax.  Patient has not been able to tolerate indwelling Foley catheter due to dementia and constant pulling on lines and Foley. 5. Recent diagnosis of insulinoma-not surgical candidate, continue diazoxide solution.  Continue to monitor patient blood glucose in the hospital.  Strict I's and O's, daily weights due to risk of fluid overload with diazoxide. 6. Lower extremity edema-likely from diazoxide.  Echo is pending.  BNP 136.4.  Will  restart Lasix. 7. ?  UTI-UA is abnormal, urine culture is pending.  Continue IV ceftriaxone. 8. Dementia-continue home regimen   Scheduled medications:   . aspirin EC  81 mg Oral Daily  . Chlorhexidine Gluconate Cloth  6 each Topical Daily  . diazoxide  100 mg Oral Q8H  . ferrous sulfate  325 mg Oral Daily  . glucose-Vitamin C  1 tablet Oral BID  . heparin injection (subcutaneous)  5,000 Units Subcutaneous Q8H  . LORazepam  1 mg Intravenous Once  . memantine  28 mg Oral QHS  . OLANZapine  5 mg Oral QHS  . rosuvastatin  10 mg Oral Daily  . tamsulosin  0.4 mg Oral Daily         Data Reviewed:   CBG:  Recent Labs  Lab 02/14/21 2146 02/15/21 0754 02/15/21 1142  GLUCAP 140* 130* 158*    SpO2: 94 %    Vitals:   02/15/21 0535 02/15/21 0649 02/15/21 0753 02/15/21 1140  BP: (!) 129/59  (!) 129/50 (!) 125/50  Pulse: 79  88 90  Resp: 17  15 16   Temp: 97.7 F (36.5 C)  98.1 F (36.7 C) 97.9 F (36.6 C)  TempSrc: Oral     SpO2: 97%  97% 94%  Weight:  84.3 kg    Height:         Intake/Output Summary (Last 24 hours) at 02/15/2021 1328 Last data filed at 02/15/2021 1035 Gross per 24 hour  Intake 343.22 ml  Output 200 ml  Net 143.22 ml    04/01 1901 - 04/03 0700 In: 93.2  Out: -   Filed Weights   02/14/21 1805 02/15/21 0649  Weight:  78 kg 84.3 kg    CBC:  Recent Labs  Lab 02/14/21 1811 02/14/21 2140 02/15/21 0448  WBC 8.2 7.7 6.8  HGB 11.5* 11.1* 11.3*  HCT 35.9* 34.7* 35.1*  PLT 312 295 297  MCV 91.6 91.6 90.9  MCH 29.3 29.3 29.3  MCHC 32.0 32.0 32.2  RDW 14.6 14.6 14.6  LYMPHSABS 1.9  --   --   MONOABS 0.6  --   --   EOSABS 0.4  --   --   BASOSABS 0.1  --   --     Complete metabolic panel:  Recent Labs  Lab 02/14/21 1811 02/14/21 2012 02/14/21 2140 02/15/21 0448  NA 139  --  140 138  K 3.7  --  3.7 3.7  CL 101  --  104 105  CO2 26  --  26 24  GLUCOSE 158*  --  146* 138*  BUN 12  --  12 11  CREATININE 1.30*  --  1.30*  1.26*  1.11*  CALCIUM 8.4*  --  8.1* 8.2*  AST 36  --   --  28  ALT 13  --   --  12  ALKPHOS 46  --   --  37*  BILITOT 0.7  --   --  0.6  ALBUMIN 3.2*  --   --  3.0*  MG  --   --  1.9  --   CRP  --   --  0.5  --   LATICACIDVEN 2.0* 1.4  --   --   TSH  --   --  3.166  --   AMMONIA  --   --  22  --   BNP  --   --  136.4*  --     No results for input(s): LIPASE, AMYLASE in the last 168 hours.  Recent Labs  Lab 02/14/21 1953 02/14/21 2140  CRP  --  0.5  BNP  --  136.4*  SARSCOV2NAA NEGATIVE  --     ------------------------------------------------------------------------------------------------------------------ No results for input(s): CHOL, HDL, LDLCALC, TRIG, CHOLHDL, LDLDIRECT in the last 72 hours.  Lab Results  Component Value Date   HGBA1C 4.7 (L) 11/05/2020   ------------------------------------------------------------------------------------------------------------------ Recent Labs    02/14/21 2140  TSH 3.166   ------------------------------------------------------------------------------------------------------------------ No results for input(s): VITAMINB12, FOLATE, FERRITIN, TIBC, IRON, RETICCTPCT in the last 72 hours.  Coagulation profile  No results for input(s): INR, PROTIME in the last 168 hours.  No results for input(s): DDIMER in the last 72 hours.  Cardiac Enzymes  No results for input(s): CKMB, TROPONINI, MYOGLOBIN in the last 168 hours.  Invalid input(s): CK ------------------------------------------------------------------------------------------------------------------    Component Value Date/Time   BNP 136.4 (H) 02/14/2021 2140     Antibiotics: Anti-infectives (From admission, onward)   Start     Dose/Rate Route Frequency Ordered Stop   02/14/21 2200  azithromycin (ZITHROMAX) 500 mg in sodium chloride 0.9 % 250 mL IVPB        500 mg 250 mL/hr over 60 Minutes Intravenous Every 24 hours 02/14/21 2110     02/14/21 2000  cefTRIAXone (ROCEPHIN)  1 g in sodium chloride 0.9 % 100 mL IVPB        1 g 200 mL/hr over 30 Minutes Intravenous  Once 02/14/21 1948 02/14/21 2026       Radiology Reports  CT HEAD WO CONTRAST  Result Date: 02/14/2021 CLINICAL DATA:  Weakness.  Mental status changes. EXAM: CT HEAD WITHOUT CONTRAST TECHNIQUE: Contiguous axial images were obtained  from the base of the skull through the vertex without intravenous contrast. COMPARISON:  CT head 11/04/2020.  MRI brain 11/04/2020 FINDINGS: Brain: Diffuse cerebral atrophy. Mild ventricular dilatation consistent with central atrophy. Low-attenuation changes in the deep white matter consistent with small vessel ischemia. No mass-effect or midline shift. No abnormal extra-axial fluid collections. Gray-white matter junctions are distinct. Basal cisterns are not effaced. No acute intracranial hemorrhage. Vascular: Prominent intracranial arterial vascular calcifications. Skull: Normal. Negative for fracture or focal lesion. Sinuses/Orbits: No acute finding. Other: No significant changes since previous studies. IMPRESSION: 1. No acute intracranial abnormalities. 2. Chronic atrophy and small vessel ischemia. Electronically Signed   By: Lucienne Capers M.D.   On: 02/14/2021 21:35   CT CHEST WO CONTRAST  Result Date: 02/14/2021 CLINICAL DATA:  Abnormal x-ray. Interstitial infiltrates. Weakness since Tuesday. EXAM: CT CHEST WITHOUT CONTRAST TECHNIQUE: Multidetector CT imaging of the chest was performed following the standard protocol without IV contrast. COMPARISON:  Chest radiograph 02/14/2021 FINDINGS: Cardiovascular: Normal heart size. No pericardial effusions. Coronary artery and aortic calcification. No aortic aneurysm. Mediastinum/Nodes: Prominent lymph nodes in the mediastinum without pathologic enlargement, likely reactive. Esophagus is decompressed. Small esophageal hiatal hernia. Lungs/Pleura: Small bilateral pleural effusions. Interstitial changes in the lungs may represent edema or  interstitial pneumonitis. Bronchial wall thickening consistent with chronic bronchitis. No pneumothorax. Upper Abdomen: Cyst in the left lobe of the liver. Surgical absence of the gallbladder. No acute abnormalities are indicated. Musculoskeletal: Degenerative changes in the spine. No destructive bone lesions. IMPRESSION: 1. Small bilateral pleural effusions. Interstitial changes in the lungs may represent edema or interstitial pneumonitis. Bronchial wall thickening consistent with chronic bronchitis. 2. Small esophageal hiatal hernia. 3. Aortic atherosclerosis. Aortic Atherosclerosis (ICD10-I70.0). Electronically Signed   By: Lucienne Capers M.D.   On: 02/14/2021 21:33   DG Chest Portable 1 View  Result Date: 02/14/2021 CLINICAL DATA:  Weakness EXAM: PORTABLE CHEST 1 VIEW COMPARISON:  11/10/2020 FINDINGS: Peribronchial thickening and interstitial prominence. Heart is normal size. No effusions. No acute bony abnormality. IMPRESSION: Peribronchial thickening and interstitial prominence, likely bronchitic changes or atypical infection. Electronically Signed   By: Rolm Baptise M.D.   On: 02/14/2021 19:02      DVT prophylaxis: Heparin  Code Status: Full code  Family Communication: Discussed with daughter at bedside, she would like to get palliative care consultation.  Also wants Lasix to be resumed as patient has had worsening leg edema since she was started on diazoxide.   Consultants:    Procedures:      Objective    Physical Examination:    General-appears in no acute distress  Heart-S1-S2, regular, no murmur auscultated  Lungs-clear to auscultation bilaterally, no wheezing or crackles auscultated  Abdomen-soft, nontender, no organomegaly  Extremities-bilateral 2+ pitting edema in the lower extremities  Neuro-alert, oriented to self only   Status is: Inpatient  Dispo: The patient is from: Home              Anticipated d/c is to: To be decided              Anticipated  d/c date is: 02/17/2021              Patient currently not medically stable for discharge  Barrier to discharge-encephalopathy  [place]    02/14/21 2140  CRP 0.5    Lab Results  Component Value Date   SARSCOV2NAA NEGATIVE 02/14/2021   SARSCOV2NAA POSITIVE (A) 11/11/2020   SARSCOV2NAA POSITIVE (A) 11/11/2020  [place] NEGATIVE 11/04/2020    Microbiology  Recent Results (from the past 240 hour(s))  Resp Panel by RT-PCR (Flu A&B, Covid) Nasopharyngeal Swab     Status: None   Collection Time: 02/14/21  7:53 PM   Specimen: Nasopharyngeal Swab; Nasopharyngeal(NP) swabs in vial transport medium  Result Value Ref Range Status   SARS Coronavirus 2 by RT PCR NEGATIVE NEGATIVE Final    Comment: (NOTE) SARS-CoV-2 target nucleic acids are NOT DETECTED.  The SARS-CoV-2 RNA is generally detectable in upper respiratory specimens during the acute phase of infection. The lowest concentration of SARS-CoV-2 viral copies this assay can detect is 138 copies/mL. A negative result does not preclude SARS-Cov-2 infection and should not be used as the sole basis for treatment or other patient management decisions. A negative result may occur with  improper specimen collection/handling, submission of specimen other than nasopharyngeal swab, presence of viral mutation(s) within the areas targeted by this assay, and inadequate number of viral copies(<138 copies/mL). A negative result must be combined with clinical observations, patient history, and epidemiological information. The expected result is Negative.  Fact Sheet for Patients:  EntrepreneurPulse.com.au  Fact Sheet for Healthcare Providers:  IncredibleEmployment.be  This test is no t yet approved or cleared by the [place] FDA and  has been authorized for detection and/or diagnosis of SARS-CoV-2 by FDA under an Emergency Use Authorization (EUA). This EUA will remain  in  effect (meaning this test can be used) for the duration of the COVID-19 declaration under Section 564(b)(1) of the Act, 21 U.S.C.section 360bbb-3(b)(1), unless the authorization is terminated  or revoked sooner.       Influenza A by PCR NEGATIVE NEGATIVE Final   Influenza B by PCR NEGATIVE NEGATIVE Final    Comment: (NOTE) The Xpert Xpress SARS-CoV-2/FLU/RSV plus assay is intended as an aid in the diagnosis of influenza from Nasopharyngeal swab specimens and should not be used as a sole basis for treatment. Nasal washings and aspirates are unacceptable for Xpert Xpress SARS-CoV-2/FLU/RSV testing.  Fact Sheet for Patients: EntrepreneurPulse.com.au  Fact Sheet for Healthcare Providers: IncredibleEmployment.be  This test is not yet approved or cleared by the [place] FDA and has been authorized for detection and/or diagnosis of SARS-CoV-2 by FDA under an Emergency Use Authorization (EUA). This EUA will remain in effect (meaning this test can be used) for the duration of the COVID-19 declaration under Section 564(b)(1) of the Act, 21 U.S.C. section 360bbb-3(b)(1), unless the authorization is terminated or revoked.  Performed at [facility], [address] [place], [place] [address]     Pressure Injury 11/05/20 Buttocks Left Stage 2 -  Partial thickness loss of dermis presenting as a shallow open injury with a red, pink wound bed without slough. (Active)  11/05/20 1200  Location: Buttocks  Location Orientation: Left  Staging: Stage 2 -  Partial thickness loss of dermis presenting as a shallow open injury with a red, pink wound bed without slough.  Wound Description (Comments):   Present on Admission: -- (Unknown)     Pressure Injury 11/05/20 Buttocks Right Stage 1 -  Intact skin with non-blanchable redness of a localized area usually over a bony prominence. (Active)  11/05/20 1217  Location: Buttocks  Location Orientation:  Right  Staging: Stage 1 -  Intact skin with non-blanchable redness of a localized area usually over a bony prominence.  Wound Description (Comments):   Present on Admission:      Pressure Injury 11/11/20 Thigh Right;Posterior deep tissue injury (Active)  11/11/20 2330  Location: Thigh  Location Orientation: Right;Posterior  Staging:   Wound Description (Comments): deep tissue injury  Present on Admission:      Pressure Injury 02/14/21 Buttocks Stage 2 -  Partial thickness loss of dermis presenting as a shallow open injury with a red, pink wound bed without slough. (Active)  02/14/21 2315  Location: Buttocks  Location Orientation:   Staging: Stage 2 -  Partial thickness loss of dermis presenting as a shallow open injury with a red, pink wound bed without slough.  Wound Description (Comments):   Present on Admission: Yes          [place]   [facility] If 7PM-7AM, please contact night-coverage at [email or web address], Office  [phone]   02/15/2021, 1:28 PM  LOS: 0 days

## 2021-02-15 NOTE — Progress Notes (Signed)
Patient confused, alert to self. Patient trying to pull out IV and patient did succeed in pulling out her Foley. MD made aware. New order to replace Foley and to give Ativan once 1 mg.

## 2021-02-15 NOTE — Progress Notes (Signed)
*  PRELIMINARY RESULTS* Echocardiogram 2D Echocardiogram has been performed.  Jonette Mate Maison Kestenbaum 02/15/2021, 3:12 PM

## 2021-02-15 NOTE — Progress Notes (Signed)
   02/15/21 0950  Clinical Encounter Type  Visited With Patient  Visit Type Initial;Spiritual support;Social support  Referral From Nurse  Consult/Referral To Chaplain  Responded to Pt room per request for OR for prayer. I prayed for the Pt and will follow up later.

## 2021-02-16 ENCOUNTER — Encounter: Payer: Self-pay | Admitting: Family Medicine

## 2021-02-16 DIAGNOSIS — Z7189 Other specified counseling: Secondary | ICD-10-CM

## 2021-02-16 DIAGNOSIS — Z515 Encounter for palliative care: Secondary | ICD-10-CM

## 2021-02-16 LAB — GLUCOSE, CAPILLARY
Glucose-Capillary: 113 mg/dL — ABNORMAL HIGH (ref 70–99)
Glucose-Capillary: 150 mg/dL — ABNORMAL HIGH (ref 70–99)
Glucose-Capillary: 184 mg/dL — ABNORMAL HIGH (ref 70–99)
Glucose-Capillary: 146 mg/dL — ABNORMAL HIGH (ref 70–99)

## 2021-02-16 MED ORDER — AZITHROMYCIN 500 MG PO TABS
500.0000 mg | ORAL_TABLET | Freq: Every day | ORAL | Status: AC
  Administered 2021-02-16 – 2021-02-18 (×3): 500 mg via ORAL
  Filled 2021-02-16 (×3): qty 1

## 2021-02-16 NOTE — Consult Note (Signed)
Consultation Note Date: 02/16/2021   Patient Name: Alexis Rodriguez  DOB: 06/04/1941  MRN: [id number]  Age / Sex: 80 y.o., female  PCP: Perrin Maltese, MD Referring Physician: Oswald Hillock, MD  Reason for Consultation: Establishing goals of care and Psychosocial/spiritual support  HPI/Patient Profile: 80 y.o. female  with past medical history of dementia, HTN/HLD, anemia, recurrent UTI, recently diagnosed with insulinoma admitted on 04/20/5992 with metabolic encephalopathy improving interstitial pneumonia, acute kidney injury.   Clinical Assessment and Goals of Care: I have reviewed medical records including EPIC notes, labs and imaging, examined the patient.  Alexis Rodriguez is sitting up in the [place] chair in her room.  She greets me making and somewhat keeping eye contact.  She appears elderly and frail.  She has known dementia, and is able to tell me her name, but not where we are.  She is calm and cooperative, not fearful.  I reassure her.  Call to daughter/HCPOA, Hurshel Keys,  to discuss diagnosis prognosis, GOC, EOL wishes, disposition and options.  I introduced Palliative Medicine as specialized medical care for people living with serious illness. It focuses on providing relief from the symptoms and stress of a serious illness.   We discussed a brief life review of the patient. Alexis Rodriguez husband died 4 years ago in March 01, 2023.   Alexis Rodriguez lives in her daughters home since Jan 2020.  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 Had Alexis Rodriguez at age 71.   As far as functional and nutritional status, declines started January 2022 when diagnosed with Insulinoma. [name] able to bath and dress with no prompting prior to this.  Now that she is stabilized with insulinoma she has improved with some functional status.  Walking with walker, was showering alone.   IADLs by daughter Annastasia Haskins.    We discussed her  current illness and what it means in the larger context of her on-going co-morbidities.  Natural disease trajectory and expectations at EOL were discussed.    Advanced directives, concepts specific to code status, artifical feeding and hydration, and rehospitalization were considered and discussed.  Cay Kath states that her mother has always said "reviive her", but shares that she realizes that things are starting to look different.  I share concerns about the realities of CPR for Alexis Rodriguez with her age and chronic illness burden.  Hospice and Palliative Care services outpatient were explained and offered.  We talk in detail about at home "treat the treatable" hospice care.  Carolann shares that she is very interested in having more support at home.  We talked about home health services and outpatient palliative versus hospice care.  Local providers discussed.  Questions and concerns were addressed.  The family was encouraged to call with questions or concerns.   Conference with attending, bedside nursing staff, transition of care team related to patient condition, needs, goals of care.   HCPOA   HCPOA - Daughter Alexis Rodriguez. Patient has children in [place]  [place]. Husband died 4 years in Feb 07, 2023.     SUMMARY OF RECOMMENDATIONS   At this point continue full scope/full code PT eval to see if qualifies for more short-term rehab Daughter is interested in outpatient palliative/"treat the treatable" hospice care  Code Status/Advance Care Planning:  Full code -would benefit from further CODE STATUS discussions  Symptom Management:   Per hospitalist, no additional needs at this time.  Palliative Prophylaxis:   Frequent Pain Assessment and Oral Care  Additional Recommendations (Limitations, Scope, Preferences):  Full Scope Treatment  Psycho-social/Spiritual:   Desire for further Chaplaincy support:no  Additional Recommendations: Caregiving  Support/Resources and Education on  Hospice  Prognosis:   Unable to determine, based on outcomes.  6 months or less would not be surprising based on functional status, chronic illness burden.  Discharge Planning: To be determined, agreeable to short-term rehab if qualified.  Outpatient palliative to follow      Primary Diagnoses: Present on Admission: . Encephalopathy acute . Encephalopathy   I have reviewed the medical record, interviewed the patient and family, and examined the patient. The following aspects are pertinent.  Past Medical History:  Diagnosis Date  . Dementia ([place])   . Hypertension   . Pancreatitis    Social History   Socioeconomic History  . Marital status: Widowed    Spouse name: Not on file  . Number of children: Not on file  . Years of education: Not on file  . Highest education level: Not on file  Occupational History  . Not on file  Tobacco Use  . Smoking status: Never Smoker  . Smokeless tobacco: Never Used  Substance and Sexual Activity  . Alcohol use: Never  . Drug use: Never  . Sexual activity: Not on file  Other Topics Concern  . Not on file  Social History Narrative  . Not on file   Social Determinants of Health   Financial Resource Strain: Not on file  Food Insecurity: Not on file  Transportation Needs: Not on file  Physical Activity: Not on file  Stress: Not on file  Social Connections: Not on file   Family History  Problem Relation Age of Onset  . Diabetes Mellitus II Mother   . Atrial fibrillation Sister    Scheduled Meds: . aspirin EC  81 mg Oral Daily  . Chlorhexidine Gluconate Cloth  6 each Topical Daily  . diazoxide  100 mg Oral Q8H  . ferrous sulfate  325 mg Oral Daily  . furosemide  20 mg Oral Daily  . glucose-Vitamin C  1 tablet Oral BID  . heparin injection (subcutaneous)  5,000 Units Subcutaneous Q8H  . memantine  28 mg Oral QHS  . OLANZapine  5 mg Oral QHS  . rosuvastatin  10 mg Oral Daily  . tamsulosin  0.4 mg Oral Daily   Continuous  Infusions: . azithromycin 500 mg (02/15/21 02/07/51)  . cefTRIAXone (ROCEPHIN)  IV 1 g (02/15/21 2111-02-07)   PRN Meds:.acetaminophen, ondansetron **OR** ondansetron (ZOFRAN) IV Medications Prior to Admission:  Prior to Admission medications   Medication Sig Start Date End Date Taking? Authorizing Provider  aspirin EC 81 MG EC tablet Take 1 tablet (81 mg total) by mouth daily. Swallow whole. 11/23/20  Yes Allie Bossier, MD  ferrous sulfate 325 (65 FE) MG tablet Take 325 mg by mouth daily. 10/21/20  Yes [provider]  Magnesium 250 MG TABS Take 250 mg by mouth daily.   Yes [provider]  melatonin 5 MG  TABS Take 5 mg by mouth at bedtime.   Yes [provider]  OLANZapine (ZYPREXA) 5 MG tablet Take 5 mg by mouth at bedtime. 10/21/20  Yes [provider]  tamsulosin (FLOMAX) 0.4 MG CAPS capsule Take 1 capsule (0.4 mg total) by mouth daily. 11/23/20  Yes Allie Bossier, MD  acetaminophen (TYLENOL) 325 MG tablet Take 2 tablets (650 mg total) by mouth every 4 (four) hours as needed for mild pain (or temp > 37.5 C (99.5 F)). Patient not taking: Reported on 02/14/2021 11/22/20   Allie Bossier, MD  ascorbic acid (VITAMIN C) 500 MG tablet Take 1 tablet (500 mg total) by mouth daily. Patient not taking: Reported on 02/14/2021 11/23/20   Allie Bossier, MD  glucose 4 GM chewable tablet Chew 1 tablet (4 g total) by mouth 2 (two) times daily. 11/22/20   Allie Bossier, MD  memantine (NAMENDA XR) 28 MG CP24 24 hr capsule Take 1 capsule (28 mg total) by mouth at bedtime. 11/22/20   Allie Bossier, MD  rosuvastatin (CRESTOR) 10 MG tablet Take 1 tablet (10 mg total) by mouth daily. 11/23/20   Allie Bossier, MD   Allergies  Allergen Reactions  . Donepezil Hcl Other (See Comments)    Bradycardia and heart block!!!  . Shellfish Allergy Anaphylaxis  . Penicillins     Other reaction(s): Unknown   Review of Systems  Unable to perform ROS: Dementia    Physical Exam Vitals and nursing  note reviewed.  Constitutional:      General: She is not in acute distress.    Appearance: She is obese. She is ill-appearing.  HENT:     Mouth/Throat:     Mouth: Mucous membranes are moist.  Cardiovascular:     Rate and Rhythm: Normal rate.  Pulmonary:     Effort: Pulmonary effort is normal. No respiratory distress.  Skin:    General: Skin is warm and dry.  Neurological:     Mental Status: She is alert.     Comments: Known dementia  Psychiatric:     Comments: Calm and cooperative, not fearful     Vital Signs: BP (!) 114/54 (BP Location: Left Arm)   Pulse 83   Temp 97.8 F (36.6 C) (Oral)   Resp 16   Ht 5\' 3"  (1.6 m)   Wt 84.5 kg   SpO2 95%   BMI 33.00 kg/m  Pain Scale: 0-10   Pain Score: 0-No pain   SpO2: SpO2: 95 % O2 Device:SpO2: 95 % O2 Flow Rate: .   IO: Intake/output summary:   Intake/Output Summary (Last 24 hours) at 02/16/2021 1442 Last data filed at 02/16/2021 1013 Gross per 24 hour  Intake 120 ml  Output 625 ml  Net -505 ml    LBM: Last BM Date:  (unknown) Baseline Weight: Weight: 78 kg Most recent weight: Weight: 84.5 kg     Palliative Assessment/Data:   Flowsheet Rows   Flowsheet Row Most Recent Value  Intake Tab   Referral Department Hospitalist  Unit at Time of Referral Med/Surg Unit  Palliative Care Primary Diagnosis Other (Comment)  Date Notified 02/16/21  Palliative Care Type New Palliative care  Reason for referral Clarify Goals of Care  Date of Admission 02/14/21  Date first seen by Palliative Care 02/16/21  # of days Palliative referral response time 0 Day(s)  # of days IP prior to Palliative referral 2  Clinical Assessment   Palliative Performance Scale Score 30%  Pain Max last 24 hours Not able to report  Pain Min Last 24 hours Not able to report  Dyspnea Max Last 24 Hours Not able to report  Dyspnea Min Last 24 hours Not able to report  Psychosocial & Spiritual Assessment   Palliative Care Outcomes       Time In:  1450 Time Out: 1600 Time Total: 70 minutes  Greater than 50%  of this time was spent counseling and coordinating care related to the above assessment and plan.  Signed by: Drue Novel, NP   Please contact Palliative Medicine Team phone at [phone] for questions and concerns.  For individual provider: See Shea Evans

## 2021-02-16 NOTE — Evaluation (Addendum)
Physical Therapy Evaluation Patient Details Name: Alexis Rodriguez MRN: [id number] DOB: 26-Feb-1941 Today's Date: 02/16/2021   History of Present Illness  Pt is an 80 y/o F who presented to the ED with increased lethargy & confusion. Pt is currently being treated for metabolic encephalopathy likely from pneumonia in setting of advanced dementia. PMH: dementia, HTN, HLD, anemia, recurrent UTI, recently dx with insulinoma s/p work up for refractory hypoglycemia, pancreatitis    Clinical Impression  Pt seen for skilled PT Evaluation as co-tx with OT for pt & therapists' safety. Pt demonstrates decreased cognition, only oriented to self, but has baseline dementia. Pt is easily redirected to tasks throughout session & performs LE strengthening exercises with PT providing demo & cuing with pt unable to complete all exercises 2/2 impaired cognition. Pt completes multiple STS from EOB with max assist +2 HHA with pt demonstrating inability to shift pelvis anteriorly despite multimodal cuing. Pt is able to complete stand pivot bed>recliner with max assist +2 HHA. At this time, recommending STR upon d/c to maximize independence with functional mobility, decrease caregiver burden, & reduce fall risk prior to return home.  Addendum: PLOF & home set up information taken from chart as pt is a poor historian.     Follow Up Recommendations SNF    Equipment Recommendations  None recommended by PT (TBD in next venue)    Recommendations for Other Services       Precautions / Restrictions Precautions Precautions: Fall Restrictions Weight Bearing Restrictions: No      Mobility  Bed Mobility Overal bed mobility: Needs Assistance Bed Mobility: Supine to Sit     Supine to sit: Mod assist;HOB elevated     General bed mobility comments: extra time to initiate supine>sit & tactile cuing to continue moving towards EOB, assistance to upright trunk to sitting    Transfers Overall transfer level: Needs  assistance Equipment used: 2 person hand held assist Transfers: Sit to/from Stand;Stand Pivot Transfers Sit to Stand: Max assist;+2 physical assistance Stand pivot transfers: +2 physical assistance;Max assist       General transfer comment: STS from EOB with max assist +2 with pt continuing to demonstrate posterior pelvis with inability to shift anteriorly despite multimodal cuing. Pt requires max assist +2 to complete stand pivot & manual facilitation to initiate steps.  Ambulation/Gait                Stairs            Wheelchair Mobility    Modified Rankin (Stroke Patients Only)       Balance Overall balance assessment: Needs assistance Sitting-balance support: Single extremity supported;Feet supported Sitting balance-Leahy Scale: Fair Sitting balance - Comments: close supervision static sitting EOB   Standing balance support: Bilateral upper extremity supported Standing balance-Leahy Scale: Poor Standing balance comment: posterior lean, decreased ability to shift pelvis anteriorly                             Pertinent Vitals/Pain Pain Assessment: Faces Faces Pain Scale: No hurt    Home Living Family/patient expects to be discharged to:: Private residence Living Arrangements: Children Available Help at Discharge: Family Type of Home: House Home Access: Level entry     Home Layout: One level Home Equipment: Environmental consultant - 2 wheels Additional Comments: Per chart, pt lives with daughter, unclear how frequently dtr is available.    Prior Function Level of Independence: Needs assistance   Gait / Transfers Assistance  Needed: Pt receiving HHPT     Comments: unable to obtain 2/2 baseline dementia     Hand Dominance        Extremity/Trunk Assessment   Upper Extremity Assessment Upper Extremity Assessment: Generalized weakness    Lower Extremity Assessment Lower Extremity Assessment: Generalized weakness       Communication    Communication: No difficulties  Cognition Arousal/Alertness: Awake/alert Behavior During Therapy: WFL for tasks assessed/performed Overall Cognitive Status: History of cognitive impairments - at baseline                                 General Comments: Pt oriented to self only. Therapists attempt to re orient pt throughout session.      General Comments      Exercises General Exercises - Lower Extremity Ankle Circles/Pumps:  (PT provides demo & instructional cuing but pt unable to accurately complete exercises) Long Arc Quad: AROM;Strengthening;Both;10 reps;Seated Hip ABduction/ADduction: AROM;Strengthening;Both;10 reps;Seated (hip adduction pillow squeezes) Hip Flexion/Marching:  (PT provides demo & instructional cuing but pt unable to accurately complete exercises)    Assessment/Plan    PT Assessment Patient needs continued PT services  PT Problem List Decreased strength;Decreased mobility;Decreased safety awareness;Decreased knowledge of precautions;Decreased activity tolerance;Decreased cognition;Decreased balance;Decreased knowledge of use of DME       PT Treatment Interventions DME instruction;Therapeutic activities;Cognitive remediation;Modalities;Gait training;Therapeutic exercise;Patient/family education;Stair training;Balance training;Wheelchair mobility training;Functional mobility training;Neuromuscular re-education;Manual techniques    PT Goals (Current goals can be found in the Care Plan section)  Acute Rehab PT Goals Patient Stated Goal: To get stronger PT Goal Formulation: With patient Time For Goal Achievement: 03/02/21 Potential to Achieve Goals: Good    Frequency Min 2X/week   Barriers to discharge Decreased caregiver support;Inaccessible home environment unsure if family can provide physical assistance at d/c    Co-evaluation PT/OT/SLP Co-Evaluation/Treatment: Yes Reason for Co-Treatment: Necessary to address cognition/behavior during  functional activity;For patient/therapist safety;To address functional/ADL transfers PT goals addressed during session: Mobility/safety with mobility;Balance OT goals addressed during session: ADL's and self-care       AM-PAC PT "6 Clicks" Mobility  Outcome Measure Help needed turning from your back to your side while in a flat bed without using bedrails?: A Little Help needed moving from lying on your back to sitting on the side of a flat bed without using bedrails?: Total Help needed moving to and from a bed to a chair (including a wheelchair)?: Total Help needed standing up from a chair using your arms (e.g., wheelchair or bedside chair)?: Total Help needed to walk in hospital room?: Total Help needed climbing 3-5 steps with a railing? : Total 6 Click Score: 8    End of Session Equipment Utilized During Treatment: Gait belt Activity Tolerance: Patient tolerated treatment well Patient left: in chair;with call bell/phone within reach;with chair alarm set (BUE mittens donned) Nurse Communication: Mobility status PT Visit Diagnosis: Unsteadiness on feet (R26.81);Difficulty in walking, not elsewhere classified (R26.2);Muscle weakness (generalized) (M62.81)    Time: 1430-1500 PT Time Calculation (min) (ACUTE ONLY): 30 min   Charges:   PT Evaluation $PT Eval Low Complexity: 1 Low PT Treatments $Therapeutic Activity: 8-22 mins        Lavone Nian, PT, DPT 02/16/21, 4:03 PM   Waunita Schooner 02/16/2021, 3:58 PM

## 2021-02-16 NOTE — Progress Notes (Addendum)
[facility]  PROGRESS NOTE  Alexis Rodriguez XIP:[id number] DOB: Nov 12, 1941 DOA: 02/14/2021 PCP: Perrin Maltese, MD   Brief HPI:   80 year old female with history of dementia, hypertension, hyperlipidemia, recurrent UTIs who was recently diagnosed with insulinoma status post refractory hypoglycemia on recent admission 12/2020 at [facility].  At that time she was deemed a poor candidate for surgery and medical management with dioxozide solution was opted for.  Patient's blood glucose has remained stable on this treatment.  She presented to the ED with increased lethargy and confusion.  Patient had 4 days of cough and congestion consistent she has become progressively lethargic and confused.    Subjective   Patient seen and examined, continues to be confused.   Assessment/Plan:     1. Metabolic encephalopathy-CT head is unremarkable, ammonia level 22.  Patient's encephalopathy is likely from pneumonia in the setting of advanced dementia.  Patient started on ceftriaxone and Zithromax for pneumonia.  She had pulled out her Foley yesterday, requiring Ativan for worsening mental status.  Could not give Haldol due to prolonged QTC interval. 2. Interstitial pneumonia-seen on CT chest, started on antibiotics as above.  Blood cultures are negative to date. 3. Acute kidney injury-renal function is improved, Lasix was held on admission.  Avoid nephrotoxic medications. 4. Urinary retention-Foley catheter in place.  Started on Flomax.  Patient has not been able to tolerate indwelling Foley catheter due to dementia and constant pulling on lines and Foley. 5. Recent diagnosis of insulinoma-not surgical candidate, continue diazoxide solution.  Continue to monitor patient blood glucose in the hospital.  Strict I's and O's, daily weights due to risk of fluid overload with diazoxide. 6. Lower extremity edema-likely from diazoxide.  Echo showed grade 1 diastolic dysfunction, EF of 60 to 65%.Marland Kitchen  BNP 136.4.   Continue Lasix 20 mg p.o. daily. 7. ?  UTI-UA is abnormal, urine culture is pending.  Continue IV ceftriaxone. 8. Dementia-continue home regimen   Scheduled medications:   . aspirin EC  81 mg Oral Daily  . Chlorhexidine Gluconate Cloth  6 each Topical Daily  . diazoxide  100 mg Oral Q8H  . ferrous sulfate  325 mg Oral Daily  . furosemide  20 mg Oral Daily  . glucose-Vitamin C  1 tablet Oral BID  . heparin injection (subcutaneous)  5,000 Units Subcutaneous Q8H  . memantine  28 mg Oral QHS  . OLANZapine  5 mg Oral QHS  . rosuvastatin  10 mg Oral Daily  . tamsulosin  0.4 mg Oral Daily         Data Reviewed:   CBG:  Recent Labs  Lab 02/15/21 1142 02/15/21 1708 02/15/21 2112 02/16/21 0747 02/16/21 1115  GLUCAP 158* 153* 122* 113* 150*    SpO2: 95 %    Vitals:   02/16/21 0046 02/16/21 0509 02/16/21 0814 02/16/21 1114  BP: (!) 113/51 125/66 (!) 120/53 (!) 114/54  Pulse: 71 75 85 83  Resp: 16 17 17 16   Temp: 99.1 F (37.3 C) 98.3 F (36.8 C) 97.8 F (36.6 C) 97.8 F (36.6 C)  TempSrc: Oral Oral Oral Oral  SpO2: 97% 100% 97% 95%  Weight:  84.5 kg    Height:         Intake/Output Summary (Last 24 hours) at 02/16/2021 1357 Last data filed at 02/16/2021 1013 Gross per 24 hour  Intake 120 ml  Output 625 ml  Net -505 ml    04/02 1901 - 04/04 0700 In: 343.2  Out: 825 [  Urine:825]  Filed Weights   02/14/21 1805 02/15/21 0649 02/16/21 0509  Weight: 78 kg 84.3 kg 84.5 kg    CBC:  Recent Labs  Lab 02/14/21 1811 02/14/21 2140 02/15/21 0448  WBC 8.2 7.7 6.8  HGB 11.5* 11.1* 11.3*  HCT 35.9* 34.7* 35.1*  PLT 312 295 297  MCV 91.6 91.6 90.9  MCH 29.3 29.3 29.3  MCHC 32.0 32.0 32.2  RDW 14.6 14.6 14.6  LYMPHSABS 1.9  --   --   MONOABS 0.6  --   --   EOSABS 0.4  --   --   BASOSABS 0.1  --   --     Complete metabolic panel:  Recent Labs  Lab 02/14/21 1811 02/14/21 2012 02/14/21 2140 02/15/21 0448  NA 139  --  140 138  K 3.7  --  3.7 3.7  CL  101  --  104 105  CO2 26  --  26 24  GLUCOSE 158*  --  146* 138*  BUN 12  --  12 11  CREATININE 1.30*  --  1.30*  1.26* 1.11*  CALCIUM 8.4*  --  8.1* 8.2*  AST 36  --   --  28  ALT 13  --   --  12  ALKPHOS 46  --   --  37*  BILITOT 0.7  --   --  0.6  ALBUMIN 3.2*  --   --  3.0*  MG  --   --  1.9  --   CRP  --   --  0.5  --   LATICACIDVEN 2.0* 1.4  --   --   TSH  --   --  3.166  --   AMMONIA  --   --  22  --   BNP  --   --  136.4*  --     No results for input(s): LIPASE, AMYLASE in the last 168 hours.  Recent Labs  Lab 02/14/21 1953 02/14/21 2140  CRP  --  0.5  BNP  --  136.4*  SARSCOV2NAA NEGATIVE  --     ------------------------------------------------------------------------------------------------------------------ No results for input(s): CHOL, HDL, LDLCALC, TRIG, CHOLHDL, LDLDIRECT in the last 72 hours.  Lab Results  Component Value Date   HGBA1C 4.7 (L) 11/05/2020   ------------------------------------------------------------------------------------------------------------------ Recent Labs    02/14/21 2140  TSH 3.166   ------------------------------------------------------------------------------------------------------------------ No results for input(s): VITAMINB12, FOLATE, FERRITIN, TIBC, IRON, RETICCTPCT in the last 72 hours.  Coagulation profile  No results for input(s): INR, PROTIME in the last 168 hours.  No results for input(s): DDIMER in the last 72 hours.  Cardiac Enzymes  No results for input(s): CKMB, TROPONINI, MYOGLOBIN in the last 168 hours.  Invalid input(s): CK ------------------------------------------------------------------------------------------------------------------    Component Value Date/Time   BNP 136.4 (H) 02/14/2021 2140     Antibiotics: Anti-infectives (From admission, onward)   Start     Dose/Rate Route Frequency Ordered Stop   02/15/21 2000  cefTRIAXone (ROCEPHIN) 1 g in sodium chloride 0.9 % 100 mL IVPB         1 g 200 mL/hr over 30 Minutes Intravenous Every 24 hours 02/15/21 1335 02/19/21 1959   02/14/21 2200  azithromycin (ZITHROMAX) 500 mg in sodium chloride 0.9 % 250 mL IVPB        500 mg 250 mL/hr over 60 Minutes Intravenous Every 24 hours 02/14/21 2110     02/14/21 2000  cefTRIAXone (ROCEPHIN) 1 g in sodium chloride 0.9 % 100 mL IVPB  1 g 200 mL/hr over 30 Minutes Intravenous  Once 02/14/21 1948 02/14/21 2026       Radiology Reports  CT HEAD WO CONTRAST  Result Date: 02/14/2021 CLINICAL DATA:  Weakness.  Mental status changes. EXAM: CT HEAD WITHOUT CONTRAST TECHNIQUE: Contiguous axial images were obtained from the base of the skull through the vertex without intravenous contrast. COMPARISON:  CT head 11/04/2020.  MRI brain 11/04/2020 FINDINGS: Brain: Diffuse cerebral atrophy. Mild ventricular dilatation consistent with central atrophy. Low-attenuation changes in the deep white matter consistent with small vessel ischemia. No mass-effect or midline shift. No abnormal extra-axial fluid collections. Gray-white matter junctions are distinct. Basal cisterns are not effaced. No acute intracranial hemorrhage. Vascular: Prominent intracranial arterial vascular calcifications. Skull: Normal. Negative for fracture or focal lesion. Sinuses/Orbits: No acute finding. Other: No significant changes since previous studies. IMPRESSION: 1. No acute intracranial abnormalities. 2. Chronic atrophy and small vessel ischemia. Electronically Signed   By: Lucienne Capers M.D.   On: 02/14/2021 21:35   CT CHEST WO CONTRAST  Result Date: 02/14/2021 CLINICAL DATA:  Abnormal x-ray. Interstitial infiltrates. Weakness since Tuesday. EXAM: CT CHEST WITHOUT CONTRAST TECHNIQUE: Multidetector CT imaging of the chest was performed following the standard protocol without IV contrast. COMPARISON:  Chest radiograph 02/14/2021 FINDINGS: Cardiovascular: Normal heart size. No pericardial effusions. Coronary artery and aortic  calcification. No aortic aneurysm. Mediastinum/Nodes: Prominent lymph nodes in the mediastinum without pathologic enlargement, likely reactive. Esophagus is decompressed. Small esophageal hiatal hernia. Lungs/Pleura: Small bilateral pleural effusions. Interstitial changes in the lungs may represent edema or interstitial pneumonitis. Bronchial wall thickening consistent with chronic bronchitis. No pneumothorax. Upper Abdomen: Cyst in the left lobe of the liver. Surgical absence of the gallbladder. No acute abnormalities are indicated. Musculoskeletal: Degenerative changes in the spine. No destructive bone lesions. IMPRESSION: 1. Small bilateral pleural effusions. Interstitial changes in the lungs may represent edema or interstitial pneumonitis. Bronchial wall thickening consistent with chronic bronchitis. 2. Small esophageal hiatal hernia. 3. Aortic atherosclerosis. Aortic Atherosclerosis (ICD10-I70.0). Electronically Signed   By: Lucienne Capers M.D.   On: 02/14/2021 21:33   DG Chest Portable 1 View  Result Date: 02/14/2021 CLINICAL DATA:  Weakness EXAM: PORTABLE CHEST 1 VIEW COMPARISON:  11/10/2020 FINDINGS: Peribronchial thickening and interstitial prominence. Heart is normal size. No effusions. No acute bony abnormality. IMPRESSION: Peribronchial thickening and interstitial prominence, likely bronchitic changes or atypical infection. Electronically Signed   By: Rolm Baptise M.D.   On: 02/14/2021 19:02   ECHOCARDIOGRAM COMPLETE  Result Date: 02/15/2021    ECHOCARDIOGRAM REPORT   Patient Name:   Alexis Rodriguez Date of Exam: 02/15/2021 Medical Rec #:  [id number]  Height:       63.0 in Accession #:    [id number] Weight:       185.8 lb Date of Birth:  22-Oct-1941  BSA:          1.874 m Patient Age:    13 years   BP:           125/50 mmHg Patient Gender: F          HR:           90 bpm. Exam Location:  [facility] Procedure: 2D Echo, Cardiac Doppler and Color Doppler Indications:     Cardiomyopathy-Unspecified I42.9  History:          Patient has prior history of Echocardiogram examinations. Risk                  Factors:Hypertension.  Sonographer:  Alyse Low Roar Referring Phys:  [id number] [place] Diagnosing Phys: Ida Rogue MD IMPRESSIONS  1. Left ventricular ejection fraction, by estimation, is 60 to 65%. The left ventricle has normal function. The left ventricle has no regional wall motion abnormalities. Left ventricular diastolic parameters are consistent with Grade I diastolic dysfunction (impaired relaxation).  2. Right ventricular systolic function is normal. The right ventricular size is normal. There is normal pulmonary artery systolic pressure. The estimated right ventricular systolic pressure is 53.6 mmHg. FINDINGS  Left Ventricle: Left ventricular ejection fraction, by estimation, is 60 to 65%. The left ventricle has normal function. The left ventricle has no regional wall motion abnormalities. The left ventricular internal cavity size was normal in size. There is  no left ventricular hypertrophy. Left ventricular diastolic parameters are consistent with Grade I diastolic dysfunction (impaired relaxation). Right Ventricle: The right ventricular size is normal. No increase in right ventricular wall thickness. Right ventricular systolic function is normal. There is normal pulmonary artery systolic pressure. The tricuspid regurgitant velocity is 2.44 m/s, and  with an assumed right atrial pressure of 5 mmHg, the estimated right ventricular systolic pressure is 64.4 mmHg. Left Atrium: Left atrial size was normal in size. Right Atrium: Right atrial size was normal in size. Pericardium: There is no evidence of pericardial effusion. Mitral Valve: The mitral valve is normal in structure. No evidence of mitral valve regurgitation. No evidence of mitral valve stenosis. Tricuspid Valve: The tricuspid valve is normal in structure. Tricuspid valve regurgitation is mild . No evidence of tricuspid stenosis. Aortic Valve: The  aortic valve is normal in structure. Aortic valve regurgitation is not visualized. Mild to moderate aortic valve sclerosis/calcification is present, without any evidence of aortic stenosis. Aortic valve mean gradient measures 9.5 mmHg. Aortic valve peak gradient measures 17.0 mmHg. Aortic valve area, by VTI measures 1.28 cm. Pulmonic Valve: The pulmonic valve was normal in structure. Pulmonic valve regurgitation is not visualized. No evidence of pulmonic stenosis. Aorta: The aortic root is normal in size and structure. Venous: The inferior vena cava is normal in size with greater than 50% respiratory variability, suggesting right atrial pressure of 3 mmHg. IAS/Shunts: No atrial level shunt detected by color flow Doppler.  LEFT VENTRICLE PLAX 2D LVIDd:         4.00 cm  Diastology LVIDs:         2.79 cm  LV e' medial:    8.05 cm/s LV PW:         0.84 cm  LV E/e' medial:  16.4 LV IVS:        0.92 cm  LV e' lateral:   8.70 cm/s LVOT diam:     1.70 cm  LV E/e' lateral: 15.2 LV SV:         51 LV SV Index:   27 LVOT Area:     2.27 cm  RIGHT VENTRICLE RV Mid diam:    2.87 cm RV S prime:     15.30 cm/s TAPSE (M-mode): 2.3 cm LEFT ATRIUM             Index       RIGHT ATRIUM           Index LA diam:        3.60 cm 1.92 cm/m  RA Area:     10.90 cm LA Vol (A2C):   46.6 ml 24.86 ml/m RA Volume:   22.60 ml  12.06 ml/m LA Vol (A4C):   32.8 ml 17.50 ml/m  LA Biplane Vol: 39.7 ml 21.18 ml/m  AORTIC VALVE                    PULMONIC VALVE AV Area (Vmax):    1.48 cm     PV Vmax:        1.36 m/s AV Area (Vmean):   1.24 cm     PV Peak grad:   7.4 mmHg AV Area (VTI):     1.28 cm     RVOT Peak grad: 3 mmHg AV Vmax:           206.00 cm/s AV Vmean:          143.000 cm/s AV VTI:            0.397 m AV Peak Grad:      17.0 mmHg AV Mean Grad:      9.5 mmHg LVOT Vmax:         134.00 cm/s LVOT Vmean:        78.000 cm/s LVOT VTI:          0.224 m LVOT/AV VTI ratio: 0.56  AORTA Ao Root diam: 2.40 cm MITRAL VALVE                TRICUSPID  VALVE MV Area (PHT): 6.12 cm     TR Peak grad:   23.8 mmHg MV Decel Time: 124 msec     TR Vmax:        244.00 cm/s MV E velocity: 132.00 cm/s MV A velocity: 178.00 cm/s  SHUNTS MV E/A ratio:  0.74         Systemic VTI:  0.22 m MV A Prime:    17.3 cm/s    Systemic Diam: 1.70 cm Ida Rogue MD Electronically signed by Ida Rogue MD Signature Date/Time: 02/15/2021/6:13:50 PM    Final       DVT prophylaxis: Heparin  Code Status: Full code  Family Communication: Discussed with daughter at bedside, will obtain palliative care consultation.   Consultants:    Procedures:      Objective    Physical Examination:    General-appears in no acute distress  Heart-S1-S2, regular, no murmur auscultated  Lungs-clear to auscultation bilaterally, no wheezing or crackles auscultated  Abdomen-soft, nontender, no organomegaly  Extremities-bilateral 1+ pitting edema in the lower extremities  Neuro-alert, oriented to self only   Status is: Inpatient  Dispo: The patient is from: Home              Anticipated d/c is to: To be decided              Anticipated d/c date is: 02/17/2021              Patient currently not medically stable for discharge  Barrier to discharge-encephalopathy  [place]    02/14/21 2140  CRP 0.5    Lab Results  Component Value Date   SARSCOV2NAA NEGATIVE 02/14/2021   SARSCOV2NAA POSITIVE (A) 11/11/2020   SARSCOV2NAA POSITIVE (A) 11/11/2020   [place] NEGATIVE 11/04/2020    Microbiology  Recent Results (from the past 240 hour(s))  Urine culture     Status: Abnormal (Preliminary result)   Collection Time: 02/14/21  7:49 PM   Specimen: Urine, Random  Result Value Ref Range Status   Specimen Description   Final    URINE, RANDOM Performed at [facility], [address] [place], [place] [address]    Special Requests   Final  NONE Performed at [facility], [address] [place], [place]  [address]    Culture (A)  Final    >=100,000 COLONIES/mL KLEBSIELLA PNEUMONIAE SUSCEPTIBILITIES TO FOLLOW Performed at [facility], [place] [address] [place], [place] [address]    Report Status PENDING  Incomplete  Resp Panel by RT-PCR (Flu A&B, Covid) Nasopharyngeal Swab     Status: None   Collection Time: 02/14/21  7:53 PM   Specimen: Nasopharyngeal Swab; Nasopharyngeal(NP) swabs in vial transport medium  Result Value Ref Range Status   SARS Coronavirus 2 by RT PCR NEGATIVE NEGATIVE Final    Comment: (NOTE) SARS-CoV-2 target nucleic acids are NOT DETECTED.  The SARS-CoV-2 RNA is generally detectable in upper respiratory specimens during the acute phase of infection. The lowest concentration of SARS-CoV-2 viral copies this assay can detect is 138 copies/mL. A negative result does not preclude SARS-Cov-2 infection and should not be used as the sole basis for treatment or other patient management decisions. A negative result may occur with  improper specimen collection/handling, submission of specimen other than nasopharyngeal swab, presence of viral mutation(s) within the areas targeted by this assay, and inadequate number of viral copies(<138 copies/mL). A negative result must be combined with clinical observations, patient history, and epidemiological information. The expected result is Negative.  Fact Sheet for Patients:  EntrepreneurPulse.com.au  Fact Sheet for Healthcare Providers:  IncredibleEmployment.be  This test is no t yet approved or cleared by the [place] FDA and  has been authorized for detection and/or diagnosis of SARS-CoV-2 by FDA under an Emergency Use Authorization (EUA). This EUA will remain  in effect (meaning this test can be used) for the duration of the COVID-19 declaration under Section 564(b)(1) of the Act, 21 U.S.C.section 360bbb-3(b)(1), unless the authorization is terminated  or revoked sooner.        Influenza A by PCR NEGATIVE NEGATIVE Final   Influenza B by PCR NEGATIVE NEGATIVE Final    Comment: (NOTE) The Xpert Xpress SARS-CoV-2/FLU/RSV plus assay is intended as an aid in the diagnosis of influenza from Nasopharyngeal swab specimens and should not be used as a sole basis for treatment. Nasal washings and aspirates are unacceptable for Xpert Xpress SARS-CoV-2/FLU/RSV testing.  Fact Sheet for Patients: EntrepreneurPulse.com.au  Fact Sheet for Healthcare Providers: IncredibleEmployment.be  This test is not yet approved or cleared by the [place] FDA and has been authorized for detection and/or diagnosis of SARS-CoV-2 by FDA under an Emergency Use Authorization (EUA). This EUA will remain in effect (meaning this test can be used) for the duration of the COVID-19 declaration under Section 564(b)(1) of the Act, 21 U.S.C. section 360bbb-3(b)(1), unless the authorization is terminated or revoked.  Performed at [facility], [address] [place], [place] [address]   Culture, blood (Routine X 2) w Reflex to ID Panel     Status: None (Preliminary result)   Collection Time: 02/15/21  2:16 PM   Specimen: BLOOD  Result Value Ref Range Status   Specimen Description BLOOD LEFT ANTECUBITAL  Final   Special Requests   Final    BOTTLES DRAWN AEROBIC AND ANAEROBIC Blood Culture adequate volume   Culture   Final    NO GROWTH < 24 HOURS Performed at [facility], [address] [place],  [address]    Report Status PENDING  Incomplete  Culture, blood (Routine X 2) w Reflex to ID Panel     Status: None (Preliminary result)   Collection Time: 02/15/21  2:16 PM   Specimen: BLOOD  Result Value Ref Range Status   Specimen Description BLOOD BLOOD LEFT HAND  Final   Special Requests   Final    BOTTLES DRAWN AEROBIC AND ANAEROBIC Blood Culture adequate volume   Culture   Final    NO GROWTH < 24 HOURS Performed at [facility], [address] [place], [place] [address]    Report Status PENDING  Incomplete    Pressure Injury 11/05/20 Buttocks Left Stage 2 -  Partial thickness loss of dermis presenting as a shallow open injury with a red, pink wound bed without slough. (Active)  11/05/20 1200  Location: Buttocks  Location Orientation: Left  Staging: Stage 2 -  Partial thickness loss of dermis presenting as a shallow open injury with a red, pink wound bed without slough.  Wound Description (Comments):   Present on Admission: -- (Unknown)     Pressure Injury 11/05/20 Buttocks Right Stage 1 -  Intact skin with non-blanchable redness of a localized area usually over a bony prominence. (Active)  11/05/20 1217  Location: Buttocks  Location Orientation: Right  Staging: Stage 1 -  Intact skin with non-blanchable redness of a localized area usually over a bony prominence.  Wound Description (Comments):   Present on Admission:      Pressure Injury 11/11/20 Thigh Right;Posterior deep tissue injury (Active)  11/11/20 2330  Location: Thigh  Location Orientation: Right;Posterior  Staging:   Wound Description (Comments): deep tissue injury  Present on Admission:      Pressure Injury 02/14/21 Buttocks Stage 2 -  Partial thickness loss of dermis presenting as a shallow open injury with a red, pink wound bed without slough. (Active)  02/14/21 2315  Location: Buttocks  Location Orientation:   Staging: Stage 2 -  Partial thickness loss of dermis presenting as a shallow open injury with a red, pink wound bed without slough.  Wound Description (Comments):   Present on Admission: Yes          [place]   [facility] If 7PM-7AM, please contact night-coverage at [email or web address], Office  [phone]   02/16/2021, 1:57 PM  LOS: 1 day

## 2021-02-16 NOTE — TOC Initial Note (Signed)
Transition of Care [facility]) - Initial/Assessment Note    Patient Details  Name: Alexis Rodriguez MRN: [id number] Date of Birth: 01-12-1941  Transition of Care [facility]) CM/SW Contact:    Pete Pelt, RN Phone Number: 02/16/2021, 1:52 PM  Clinical Narrative:    Damaris Schooner to daughter [name] on the phone.  Patient arrived here from home-lives with daughter.  She was discharged from [facility] last Thursday.  Daughter expected to take patient to her house with home health, but inquired as to whether patient can return to rehab for PT/OT.  Request made to MD, PT/OT ordered.  Daughter states no issues with getting patient to appointments and getting meds.  TOC explained that we will regroup after PT/OT recommendations SNF vs Home.  Daughter amenable.  TOC contact information given, TOC will follow through discharge.               Expected Discharge Plan: [place] Barriers to Discharge: Continued Medical Work up   Patient Goals and CMS Choice     Choice offered to / list presented to : NA  Expected Discharge Plan and Services Expected Discharge Plan: [place] Acute Care Choice:  (TBD HOme with home health vs SNF) Living arrangements for the past 2 months: Single Family Home                 DME Arranged:  (TBD)         HH Arranged:  (TBD)          Prior Living Arrangements/Services Living arrangements for the past 2 months: Single Family Home Lives with:: Adult Children Patient language and need for interpreter reviewed:: Yes        Need for Family Participation in Patient Care: Yes (Comment) Care giver support system in place?: Yes (comment)   Criminal Activity/Legal Involvement Pertinent to Current Situation/Hospitalization: No - Comment as needed  Activities of Daily Living Home Assistive Devices/Equipment: Walker (specify type),Bedside commode/3-in-1,Wheelchair,Raised toilet seat with rails,Shower chair with back,Grab bars in  shower,Grab bars around toilet ADL Screening (condition at time of admission) Patient's cognitive ability adequate to safely complete daily activities?: No Is the patient deaf or have difficulty hearing?: No Does the patient have difficulty seeing, even when wearing glasses/contacts?: Yes (blind in R eye) Does the patient have difficulty concentrating, remembering, or making decisions?: Yes Patient able to express need for assistance with ADLs?: Yes Does the patient have difficulty dressing or bathing?: Yes Independently performs ADLs?: No Communication: Needs assistance Is this a change from baseline?: Pre-admission baseline Dressing (OT): Needs assistance Is this a change from baseline?: Pre-admission baseline Grooming: Needs assistance Is this a change from baseline?: Pre-admission baseline Feeding: Independent Bathing: Needs assistance Is this a change from baseline?: Pre-admission baseline Toileting: Needs assistance Is this a change from baseline?: Change from baseline, expected to last >3days In/Out Bed: Needs assistance Is this a change from baseline?: Change from baseline, expected to last >3 days Walks in Home: Needs assistance Is this a change from baseline?: Change from baseline, expected to last >3 days Does the patient have difficulty walking or climbing stairs?: Yes Weakness of Legs: Both Weakness of Arms/Hands: Both  Permission Sought/Granted   Permission granted to share information with : Yes, Verbal Permission Granted     Permission granted to share info w AGENCY: SNF and/or [facility] for selection  Permission granted to share info w Relationship: Daughter [name]  Emotional Assessment Appearance:: Appears stated age Attitude/Demeanor/Rapport: Gracious Affect (typically observed): Pleasant Orientation: : Oriented to Self Alcohol / Substance Use: Not Applicable Psych Involvement: No (comment)  Admission diagnosis:  Encephalopathy acute  [G93.40] Encephalopathy [G93.40] Acute cystitis without hematuria [N30.00] Pressure injury of sacral region, stage 3 () [L89.153] Patient Active Problem List   Diagnosis Date Noted  . Encephalopathy 02/15/2021  . Encephalopathy acute 02/14/2021  . Heart block   . Third degree AV block ([place])   . AKI (acute kidney injury) ([place])   . Pressure injury of skin 11/06/2020  . Stroke-like symptoms 11/04/2020  . Depression 11/04/2020  . HTN (hypertension)   . Hypoglycemia   . Dementia ([place])   . HLD (hyperlipidemia)   . Iron deficiency anemia   . CKD (chronic kidney disease), stage IIIa    PCP:  Perrin Maltese, MD Pharmacy:  No Pharmacies Listed    Social Determinants of Health (SDOH) Interventions    Readmission Risk Interventions Readmission Risk Prevention Plan 02/16/2021  Transportation Screening Complete  PCP or Specialist Appt within 5-7 Days Complete  Home Care Screening Complete  Medication Review (RN CM) Complete  Some recent data might be hidden

## 2021-02-16 NOTE — Progress Notes (Signed)
PHARMACIST - PHYSICIAN COMMUNICATION DR: Darrick Meigs CONCERNING: Antibiotic IV to Oral Route Change Policy  RECOMMENDATION: This patient is receiving azithromycin by the intravenous route.  Based on criteria approved by the Pharmacy and Therapeutics Committee, the antibiotic(s) is/are being converted to the equivalent oral dose form(s).   DESCRIPTION: These criteria include:  Patient being treated for a respiratory tract infection, urinary tract infection, cellulitis or clostridium difficile associated diarrhea if on metronidazole  The patient is not neutropenic and does not exhibit a GI malabsorption state  The patient is eating (either orally or via tube) and/or has been taking other orally administered medications for a least 24 hours  The patient is improving clinically and has a Tmax < 100.5  If you have questions about this conversion, please contact the [place], PharmD, BCPS Clinical Pharmacist 02/16/2021 2:49 PM

## 2021-02-16 NOTE — Evaluation (Signed)
Occupational Therapy Evaluation Patient Details Name: Alexis Rodriguez MRN: [id number] DOB: 01/08/1941 Today's Date: 02/16/2021    History of Present Illness [name] is a 80 y.o. female with medical history significant for dementia, HTN, HLD, Anemia, recurrent UTI, recently diagnosed isulinoma s/p work up for refractroy hypoglycemia on recent admission 2/22 at [facility].   Clinical Impression   [name] was seen for OT/PT co-evaluation this date. Per chart, PTA pt was living with daughter, recently d/c from [place]. Pt presents to acute OT demonstrating impaired ADL performance and functional mobility 2/2 decreased activity tolerance, functional strength/ROM/balance deficits, and poor insight into deficits. Pt currently requires MOD A sup>sit. SETUP + SBA grooming seated EOB. Initial MAX A x2 sit<>stand, dififculty sequencing for upright posture. Decreasing to MOD A x2 for SPT bed>chair. Attempted head start chair sensor however pt unable to demonstrate proper removal, therefore not appropriate at this time. RN notified pt left in chair, chair alarm on, mitts donned. Pt would benefit from skilled OT to address noted impairments and functional limitations (see below for any additional details) in order to maximize safety and independence while minimizing falls risk and caregiver burden. Upon hospital discharge, recommend STR to maximize pt safety and return to PLOF.     Follow Up Recommendations  SNF    Equipment Recommendations  Other (comment) (TBD at next venue of care)    Recommendations for Other Services       Precautions / Restrictions Precautions Precautions: Fall Restrictions Weight Bearing Restrictions: No      Mobility Bed Mobility Overal bed mobility: Needs Assistance Bed Mobility: Supine to Sit     Supine to sit: Mod assist          Transfers Overall transfer level: Needs assistance Equipment used: 2 person hand held assist Transfers: Sit to/from Stand;Stand Pivot  Transfers Sit to Stand: Mod assist;+2 physical assistance Stand pivot transfers: Mod assist;+2 physical assistance       General transfer comment: Initial MAX A x2 sit<>stand, dififculty sequencing for upright posture. Decreasing to MOD A x2    Balance Overall balance assessment: Needs assistance Sitting-balance support: Single extremity supported;Feet supported Sitting balance-Leahy Scale: Fair     Standing balance support: Bilateral upper extremity supported Standing balance-Leahy Scale: Poor                             ADL either performed or assessed with clinical judgement   ADL Overall ADL's : Needs assistance/impaired                                       General ADL Comments: SETUP + SBA grooming seated EOB. MOD A x2 for ADL t/f.                  Pertinent Vitals/Pain Pain Assessment: Faces Faces Pain Scale: No hurt     Hand Dominance     Extremity/Trunk Assessment Upper Extremity Assessment Upper Extremity Assessment: Generalized weakness   Lower Extremity Assessment Lower Extremity Assessment: Generalized weakness       Communication Communication Communication: No difficulties   Cognition Arousal/Alertness: Awake/alert Behavior During Therapy: WFL for tasks assessed/performed Overall Cognitive Status: History of cognitive impairments - at baseline  General Comments       Exercises Exercises: Other exercises Other Exercises Other Exercises: Pt educated re: OT role, DME recs, d/c recs, falls prevention Other Exercises: Grooming, sup<>sit, sit<>stand, sitting/standing balance/tolerance, SPT   Shoulder Instructions      Home Living Family/patient expects to be discharged to:: Private residence Living Arrangements: Children Available Help at Discharge: Family Type of Home: House Home Access: Level entry     Home Layout: One level               Home  Equipment: Environmental consultant - 2 wheels   Additional Comments: Per chart, pt lives with daughter, unclear how frequently dtr is available.      Prior Functioning/Environment Level of Independence: Needs assistance  Gait / Transfers Assistance Needed: Pt receiving HHPT     Comments: unable to obtain 2/2 baseline dementia        OT Problem List: Decreased strength;Decreased activity tolerance;Impaired balance (sitting and/or standing);Decreased safety awareness;Decreased cognition      OT Treatment/Interventions: Self-care/ADL training;Therapeutic exercise;DME and/or AE instruction;Energy conservation;Therapeutic activities;Patient/family education;Balance training    OT Goals(Current goals can be found in the care plan section) Acute Rehab OT Goals Patient Stated Goal: To get stronger OT Goal Formulation: With patient Time For Goal Achievement: 03/02/21 Potential to Achieve Goals: Fair ADL Goals Pt Will Perform Grooming: with modified independence;sitting Pt Will Perform Lower Body Dressing: with min assist;sitting/lateral leans Pt Will Transfer to Toilet: with min assist;with +2 assist;bedside commode (c LRAD PRN)  OT Frequency: Min 1X/week   Barriers to D/C: Decreased caregiver support;Inaccessible home environment          Co-evaluation PT/OT/SLP Co-Evaluation/Treatment: Yes Reason for Co-Treatment: Necessary to address cognition/behavior during functional activity;For patient/therapist safety;To address functional/ADL transfers PT goals addressed during session: Mobility/safety with mobility;Balance OT goals addressed during session: ADL's and self-care      AM-PAC OT "6 Clicks" Daily Activity     Outcome Measure Help from another person eating meals?: A Little Help from another person taking care of personal grooming?: A Little Help from another person toileting, which includes using toliet, bedpan, or urinal?: A Lot Help from another person bathing (including washing,  rinsing, drying)?: A Lot Help from another person to put on and taking off regular upper body clothing?: A Little Help from another person to put on and taking off regular lower body clothing?: A Lot 6 Click Score: 15   End of Session Equipment Utilized During Treatment: Gait belt Nurse Communication: Mobility status (RN notified in chair, +2 for return to bed)  Activity Tolerance: Patient tolerated treatment well Patient left: in chair;with call bell/phone within reach;with chair alarm set  OT Visit Diagnosis: Other abnormalities of gait and mobility (R26.89);Muscle weakness (generalized) (M62.81)                Time: 9379-0240 OT Time Calculation (min): 32 min Charges:  OT General Charges $OT Visit: 1 Visit OT Evaluation $OT Eval Low Complexity: 1 Low OT Treatments $Self Care/Home Management : 8-22 mins  Dessie Coma, M.S. OTR/L  02/16/21, 3:41 PM  ascom [phone]

## 2021-02-17 LAB — GLUCOSE, CAPILLARY
Glucose-Capillary: 123 mg/dL — ABNORMAL HIGH (ref 70–99)
Glucose-Capillary: 116 mg/dL — ABNORMAL HIGH (ref 70–99)
Glucose-Capillary: 176 mg/dL — ABNORMAL HIGH (ref 70–99)
Glucose-Capillary: 166 mg/dL — ABNORMAL HIGH (ref 70–99)

## 2021-02-17 LAB — URINE CULTURE: Culture: >=100000 — AB

## 2021-02-17 NOTE — TOC Progression Note (Signed)
Transition of Care [facility]) - Progression Note    Patient Details  Name: Alexis Rodriguez MRN: [id number] Date of Birth: 12-Oct-1941  Transition [facility] Care [facility]) CM/SW [place], RN Phone Number: 02/17/2021, 4:08 PM  Clinical Narrative:   [facility] contacted daughter, who stated that patient had [facility] home health and daughter feels comfortable if patient discharges to their care, along with consults from other disciplines.  [place] at [facility] notified and can accept patient back into their care.  TOC will follow through discharge.    Expected Discharge Plan: [place] Barriers to Discharge: Continued Medical Work up  Expected Discharge Plan and Services Expected Discharge Plan: [place] Choice:  (TBD HOme with home health vs SNF) Living arrangements for the past 2 months: Single Family Home                 DME Arranged:  (TBD)         HH Arranged:  (TBD)           Social Determinants of Health (SDOH) Interventions    Readmission Risk Interventions Readmission Risk Prevention Plan 02/16/2021  Transportation Screening Complete  PCP or Specialist Appt within 5-7 Days Complete  Home Care Screening Complete  Medication Review (RN CM) Complete  Some recent data might be hidden

## 2021-02-17 NOTE — Progress Notes (Signed)
Physical Therapy Treatment Patient Details Name: Alexis Rodriguez MRN: [id number] DOB: Jun 12, 1941 Today's Date: 02/17/2021    History of Present Illness Pt is an 80 y/o F who presented to the ED with increased lethargy & confusion. Pt is currently being treated for metabolic encephalopathy likely from pneumonia in setting of advanced dementia. PMH: dementia, HTN, HLD, anemia, recurrent UTI, recently dx with insulinoma s/p work up for refractory hypoglycemia, pancreatitis    PT Comments    Pt initially refusing PT, I was able to convince her to at least do some in-bed exercises and she was willing to do that much, though c/o "I'm too tired" to do too much.  She ultimately was able to participate with some light supine LE exercises.  She was able to tolerate some limited resistance with some exercises but ultimately was weak, sensitive to even modest palpation/resistance and needed consistent reinforcement to see the sets/reps through.  Pleasant but limited t/o the session.   Follow Up Recommendations  SNF     Equipment Recommendations  None recommended by PT    Recommendations for Other Services       Precautions / Restrictions Precautions Precautions: Fall Restrictions Weight Bearing Restrictions: No    Mobility  Bed Mobility               General bed mobility comments: Pt refusing mobility this date, "I'm too tired"    Transfers                    Ambulation/Gait                 Stairs             Wheelchair Mobility    Modified Rankin (Stroke Patients Only)       Balance                                            Cognition Arousal/Alertness: Awake/alert Behavior During Therapy: WFL for tasks assessed/performed Overall Cognitive Status: History of cognitive impairments - at baseline                                        Exercises General Exercises - Lower Extremity Ankle Circles/Pumps:  Strengthening;10 reps (c/o pain with palpation for resisted DF) Quad Sets: AROM;5 reps Short Arc Quad: AROM;5 reps Heel Slides: AROM;5 reps (with lightly resisted leg ext) Hip ABduction/ADduction: AROM;Strengthening;10 reps Straight Leg Raises: AROM;5 reps (unable to maintain TKE)    General Comments        Pertinent Vitals/Pain Pain Assessment: Faces Faces Pain Scale: Hurts little more Pain Location: general c/o soreness with moderate LE palpation/pressure    Home Living                      Prior Function            PT Goals (current goals can now be found in the care plan section) Progress towards PT goals: Progressing toward goals    Frequency    Min 2X/week      PT Plan Current plan remains appropriate    Co-evaluation              AM-PAC PT "6 Clicks" Mobility   Outcome Measure  Help needed  turning from your back to your side while in a flat bed without using bedrails?: A Little Help needed moving from lying on your back to sitting on the side of a flat bed without using bedrails?: Total Help needed moving to and from a bed to a chair (including a wheelchair)?: Total Help needed standing up from a chair using your arms (e.g., wheelchair or bedside chair)?: Total Help needed to walk in hospital room?: Total Help needed climbing 3-5 steps with a railing? : Total 6 Click Score: 8    End of Session Equipment Utilized During Treatment: Gait belt Activity Tolerance: Patient tolerated treatment well Patient left: with bed alarm set;with call bell/phone within reach Nurse Communication: Mobility status PT Visit Diagnosis: Unsteadiness on feet (R26.81);Difficulty in walking, not elsewhere classified (R26.2);Muscle weakness (generalized) (M62.81)     Time: 2094-7096 PT Time Calculation (min) (ACUTE ONLY): 14 min  Charges:  $Therapeutic Exercise: 8-22 mins                     Kreg Shropshire, DPT 02/17/2021, 4:37 PM

## 2021-02-17 NOTE — Progress Notes (Signed)
[facility]) Hospital Liaison RN note:  Received new referral for [facility] out patient palliative program to follow post discharge from Quinn Axe, NP. Patient information given to referral. Plan is to discharge home with [facility]. Hospital care team is aware. [place] Liaison will follow for disposition.  Thank you for this referral.  Zandra Abts, RN [facility] Liaison [phone]

## 2021-02-17 NOTE — Progress Notes (Signed)
Palliative: Mrs. Yahnke is lying quietly in bed.  She greets me, making and mostly keeping eye contact.  Nursing staff is at bedside attending to needs.  Mrs. Morash has known memory loss and is oriented to person only.  She is able to make her basic needs known.  There is no family at bedside at this time.  Call to daughter, Seraphine Gudiel.   Vear Staton tells me that she does not have legal guardianship, but does have durable and healthcare power of attorney.  We talked about my visit with Mrs. Bench this morning.  We also talked about short-term rehab with outpatient palliative services versus home with home health and outpatient palliative services.  Madeeha Costantino continues to state that she would like to transition to "treat the treatable" hospice care when appropriate.  She shares that she sees a lot of back-and-forth with illness, and would appreciate assistance in managing Mrs. Lutter's acute and chronic illnesses at home.  Conference with attending, bedside nursing staff, transition of care, local palliative team representative related to patient condition, Foley catheter removal, needs, goals of care, disposition.  Plan: At this point continue to treat the treatable.  Agreeable to short-term rehab for strength and balance.  Agreeable to outpatient palliative services to follow.  At this point, would rehospitalize as needed.     32 minutes  Quinn Axe, NP Palliative medicine team Team time [phone] Greater than 50% of this time was spent counseling and coordinating care related to the above assessment and plan.

## 2021-02-17 NOTE — Progress Notes (Signed)
[facility]  PROGRESS NOTE  Alexis Rodriguez JME:[id number] DOB: Apr 06, 1941 DOA: 02/14/2021 PCP: Perrin Maltese, MD   Brief HPI:   80 year old female with history of dementia, hypertension, hyperlipidemia, recurrent UTIs who was recently diagnosed with insulinoma status post refractory hypoglycemia on recent admission 12/2020 at [facility].  At that time she was deemed a poor candidate for surgery and medical management with dioxozide solution was opted for.  Patient's blood glucose has remained stable on this treatment.  She presented to the ED with increased lethargy and confusion.  Patient had 4 days of cough and congestion consistent she has become progressively lethargic and confused.    Subjective   Patient seen and examined, continues to be pleasantly confused.  Denies any complaints this morning.   Assessment/Plan:     1. Metabolic encephalopathy-improved, likely from underlying UTI and pneumonia.  CT head is unremarkable, ammonia level 22.  Patient's encephalopathy is likely from pneumonia in the setting of advanced dementia.  Patient started on ceftriaxone and Zithromax for pneumonia.  She had pulled out her Foley yesterday, requiring Ativan for worsening mental status.  Could not give Haldol due to prolonged QTC interval. 2. Interstitial pneumonia-seen on CT chest, started on antibiotics as above.  Blood cultures are negative to date.  Will complete at least 5 days of antibiotics treatment.  Today day #3 of antibiotics. 3. UTI-patient's UA was abnormal, urine culture growing Klebsiella pneumoniae.  Sensitive to ceftriaxone.  Will switch to Cefpodoxime if needed at discharge. 4. Acute kidney injury-renal function is improved, Lasix was held on admission.  Avoid nephrotoxic medications. 5. Urinary retention-Foley catheter in place.  Started on Flomax.  Patient has not been able to tolerate indwelling Foley catheter due to dementia and constant pulling on lines and Foley. 6. Recent  diagnosis of insulinoma-not surgical candidate, continue diazoxide solution.  Continue to monitor patient blood glucose in the hospital.  Strict I's and O's, daily weights due to risk of fluid overload with diazoxide. 7. Lower extremity edema-likely from diazoxide.  Echo showed grade 1 diastolic dysfunction, EF of 60 to 65%.Marland Kitchen  BNP 136.4.  Continue Lasix 20 mg p.o. daily. 8. Dementia-continue home regimen 9. Goals of care-palliative care was consulted, patient to get palliative care as outpatient    Scheduled medications:   . aspirin EC  81 mg Oral Daily  . azithromycin  500 mg Oral Daily  . Chlorhexidine Gluconate Cloth  6 each Topical Daily  . diazoxide  100 mg Oral Q8H  . ferrous sulfate  325 mg Oral Daily  . furosemide  20 mg Oral Daily  . glucose-Vitamin C  1 tablet Oral BID  . heparin injection (subcutaneous)  5,000 Units Subcutaneous Q8H  . memantine  28 mg Oral QHS  . OLANZapine  5 mg Oral QHS  . rosuvastatin  10 mg Oral Daily  . tamsulosin  0.4 mg Oral Daily         Data Reviewed:   CBG:  Recent Labs  Lab 02/16/21 1115 02/16/21 1610 02/16/21 2053 02/17/21 0813 02/17/21 1210  GLUCAP 150* 184* 146* 123* 116*    SpO2: 100 %    Vitals:   02/17/21 0116 02/17/21 0514 02/17/21 0815 02/17/21 1147  BP: (!) 112/53 128/68 (!) 132/58 131/61  Pulse: 73 87 77 82  Resp: 16 18 18 18   Temp: 97.8 F (36.6 C) 97.7 F (36.5 C) 98.6 F (37 C) 98 F (36.7 C)  TempSrc:   Oral Oral  SpO2: 95% 100%  97% 100%  Weight:      Height:         Intake/Output Summary (Last 24 hours) at 02/17/2021 1413 Last data filed at 02/17/2021 1006 Gross per 24 hour  Intake 240 ml  Output 1250 ml  Net -1010 ml    04/03 1901 - 04/05 0700 In: 120 [P.O.:120] Out: 1875 [Urine:1875]  Filed Weights   02/14/21 1805 02/15/21 0649 02/16/21 0509  Weight: 78 kg 84.3 kg 84.5 kg    CBC:  Recent Labs  Lab 02/14/21 1811 02/14/21 2140 02/15/21 0448  WBC 8.2 7.7 6.8  HGB 11.5* 11.1* 11.3*   HCT 35.9* 34.7* 35.1*  PLT 312 295 297  MCV 91.6 91.6 90.9  MCH 29.3 29.3 29.3  MCHC 32.0 32.0 32.2  RDW 14.6 14.6 14.6  LYMPHSABS 1.9  --   --   MONOABS 0.6  --   --   EOSABS 0.4  --   --   BASOSABS 0.1  --   --     Complete metabolic panel:  Recent Labs  Lab 02/14/21 1811 02/14/21 2012 02/14/21 2140 02/15/21 0448  NA 139  --  140 138  K 3.7  --  3.7 3.7  CL 101  --  104 105  CO2 26  --  26 24  GLUCOSE 158*  --  146* 138*  BUN 12  --  12 11  CREATININE 1.30*  --  1.30*  1.26* 1.11*  CALCIUM 8.4*  --  8.1* 8.2*  AST 36  --   --  28  ALT 13  --   --  12  ALKPHOS 46  --   --  37*  BILITOT 0.7  --   --  0.6  ALBUMIN 3.2*  --   --  3.0*  MG  --   --  1.9  --   CRP  --   --  0.5  --   LATICACIDVEN 2.0* 1.4  --   --   TSH  --   --  3.166  --   AMMONIA  --   --  22  --   BNP  --   --  136.4*  --     No results for input(s): LIPASE, AMYLASE in the last 168 hours.  Recent Labs  Lab 02/14/21 1953 02/14/21 2140  CRP  --  0.5  BNP  --  136.4*  SARSCOV2NAA NEGATIVE  --     ------------------------------------------------------------------------------------------------------------------ No results for input(s): CHOL, HDL, LDLCALC, TRIG, CHOLHDL, LDLDIRECT in the last 72 hours.  Lab Results  Component Value Date   HGBA1C 4.7 (L) 11/05/2020   ------------------------------------------------------------------------------------------------------------------ Recent Labs    02/14/21 2140  TSH 3.166   ------------------------------------------------------------------------------------------------------------------ No results for input(s): VITAMINB12, FOLATE, FERRITIN, TIBC, IRON, RETICCTPCT in the last 72 hours.  Coagulation profile  No results for input(s): INR, PROTIME in the last 168 hours.  No results for input(s): DDIMER in the last 72 hours.  Cardiac Enzymes  No results for input(s): CKMB, TROPONINI, MYOGLOBIN in the last 168 hours.  Invalid input(s):  CK ------------------------------------------------------------------------------------------------------------------    Component Value Date/Time   BNP 136.4 (H) 02/14/2021 2140     Antibiotics: Anti-infectives (From admission, onward)   Start     Dose/Rate Route Frequency Ordered Stop   02/16/21 2000  azithromycin (ZITHROMAX) tablet 500 mg        500 mg Oral Daily 02/16/21 1448     02/15/21 2000  cefTRIAXone (ROCEPHIN) 1 g in sodium  chloride 0.9 % 100 mL IVPB        1 g 200 mL/hr over 30 Minutes Intravenous Every 24 hours 02/15/21 1335 02/19/21 1959   02/14/21 2200  azithromycin (ZITHROMAX) 500 mg in sodium chloride 0.9 % 250 mL IVPB  Status:  Discontinued        500 mg 250 mL/hr over 60 Minutes Intravenous Every 24 hours 02/14/21 2110 02/16/21 1448   02/14/21 2000  cefTRIAXone (ROCEPHIN) 1 g in sodium chloride 0.9 % 100 mL IVPB        1 g 200 mL/hr over 30 Minutes Intravenous  Once 02/14/21 1948 02/14/21 2026       Radiology Reports  ECHOCARDIOGRAM COMPLETE  Result Date: 02/15/2021    ECHOCARDIOGRAM REPORT   Patient Name:   SADEEN WIEGEL Date of Exam: 02/15/2021 Medical Rec #:  [id number]  Height:       63.0 in Accession #:    [id number] Weight:       185.8 lb Date of Birth:  11-Feb-1941  BSA:          1.874 m Patient Age:    29 years   BP:           125/50 mmHg Patient Gender: F          HR:           90 bpm. Exam Location:  [facility] Procedure: 2D Echo, Cardiac Doppler and Color Doppler Indications:     Cardiomyopathy-Unspecified I42.9  History:         Patient has prior history of Echocardiogram examinations. Risk                  Factors:Hypertension.  Sonographer:     Alyse Low Roar Referring Phys:  [id number] Victoriano Lain A THOMAS Diagnosing Phys: Ida Rogue MD IMPRESSIONS  1. Left ventricular ejection fraction, by estimation, is 60 to 65%. The left ventricle has normal function. The left ventricle has no regional wall motion abnormalities. Left ventricular diastolic parameters are consistent  with Grade I diastolic dysfunction (impaired relaxation).  2. Right ventricular systolic function is normal. The right ventricular size is normal. There is normal pulmonary artery systolic pressure. The estimated right ventricular systolic pressure is 44.0 mmHg. FINDINGS  Left Ventricle: Left ventricular ejection fraction, by estimation, is 60 to 65%. The left ventricle has normal function. The left ventricle has no regional wall motion abnormalities. The left ventricular internal cavity size was normal in size. There is  no left ventricular hypertrophy. Left ventricular diastolic parameters are consistent with Grade I diastolic dysfunction (impaired relaxation). Right Ventricle: The right ventricular size is normal. No increase in right ventricular wall thickness. Right ventricular systolic function is normal. There is normal pulmonary artery systolic pressure. The tricuspid regurgitant velocity is 2.44 m/s, and  with an assumed right atrial pressure of 5 mmHg, the estimated right ventricular systolic pressure is 34.7 mmHg. Left Atrium: Left atrial size was normal in size. Right Atrium: Right atrial size was normal in size. Pericardium: There is no evidence of pericardial effusion. Mitral Valve: The mitral valve is normal in structure. No evidence of mitral valve regurgitation. No evidence of mitral valve stenosis. Tricuspid Valve: The tricuspid valve is normal in structure. Tricuspid valve regurgitation is mild . No evidence of tricuspid stenosis. Aortic Valve: The aortic valve is normal in structure. Aortic valve regurgitation is not visualized. Mild to moderate aortic valve sclerosis/calcification is present, without any evidence of aortic stenosis. Aortic valve mean gradient measures 9.5 mmHg.  Aortic valve peak gradient measures 17.0 mmHg. Aortic valve area, by VTI measures 1.28 cm. Pulmonic Valve: The pulmonic valve was normal in structure. Pulmonic valve regurgitation is not visualized. No evidence of  pulmonic stenosis. Aorta: The aortic root is normal in size and structure. Venous: The inferior vena cava is normal in size with greater than 50% respiratory variability, suggesting right atrial pressure of 3 mmHg. IAS/Shunts: No atrial level shunt detected by color flow Doppler.  LEFT VENTRICLE PLAX 2D LVIDd:         4.00 cm  Diastology LVIDs:         2.79 cm  LV e' medial:    8.05 cm/s LV PW:         0.84 cm  LV E/e' medial:  16.4 LV IVS:        0.92 cm  LV e' lateral:   8.70 cm/s LVOT diam:     1.70 cm  LV E/e' lateral: 15.2 LV SV:         51 LV SV Index:   27 LVOT Area:     2.27 cm  RIGHT VENTRICLE RV Mid diam:    2.87 cm RV S prime:     15.30 cm/s TAPSE (M-mode): 2.3 cm LEFT ATRIUM             Index       RIGHT ATRIUM           Index LA diam:        3.60 cm 1.92 cm/m  RA Area:     10.90 cm LA Vol (A2C):   46.6 ml 24.86 ml/m RA Volume:   22.60 ml  12.06 ml/m LA Vol (A4C):   32.8 ml 17.50 ml/m LA Biplane Vol: 39.7 ml 21.18 ml/m  AORTIC VALVE                    PULMONIC VALVE AV Area (Vmax):    1.48 cm     PV Vmax:        1.36 m/s AV Area (Vmean):   1.24 cm     PV Peak grad:   7.4 mmHg AV Area (VTI):     1.28 cm     RVOT Peak grad: 3 mmHg AV Vmax:           206.00 cm/s AV Vmean:          143.000 cm/s AV VTI:            0.397 m AV Peak Grad:      17.0 mmHg AV Mean Grad:      9.5 mmHg LVOT Vmax:         134.00 cm/s LVOT Vmean:        78.000 cm/s LVOT VTI:          0.224 m LVOT/AV VTI ratio: 0.56  AORTA Ao Root diam: 2.40 cm MITRAL VALVE                TRICUSPID VALVE MV Area (PHT): 6.12 cm     TR Peak grad:   23.8 mmHg MV Decel Time: 124 msec     TR Vmax:        244.00 cm/s MV E velocity: 132.00 cm/s MV A velocity: 178.00 cm/s  SHUNTS MV E/A ratio:  0.74         Systemic VTI:  0.22 m MV A Prime:    17.3 cm/s    Systemic Diam: 1.70 cm Ida Rogue MD  Electronically signed by Ida Rogue MD Signature Date/Time: 02/15/2021/6:13:50 PM    Final       DVT prophylaxis: Heparin  Code Status: Full  code  Family Communication: Discussed with daughter at bedside, will obtain palliative care consultation.   Consultants:    Procedures:      Objective    Physical Examination:   General-appears in no acute distress Heart-S1-S2, regular, no murmur auscultated Lungs-clear to auscultation bilaterally, no wheezing or crackles auscultated Abdomen-soft, nontender, no organomegaly Extremities-trace edema noted bilaterally in lower extremities Neuro-alert, oriented to self only  Status is: Inpatient  Dispo: The patient is from: Home              Anticipated d/c is to: Home with home health with palliative care out as outpatient              Anticipated d/c date is: 02/19/2021              Patient currently not medically stable for discharge  Barrier to discharge-encephalopathy  COVID-19 Labs  Recent Labs    02/14/21 2140  CRP 0.5    Lab Results  Component Value Date   SARSCOV2NAA NEGATIVE 02/14/2021   SARSCOV2NAA POSITIVE (A) 11/11/2020   SARSCOV2NAA POSITIVE (A) 11/11/2020   [place] NEGATIVE 11/04/2020    Microbiology  Recent Results (from the past 240 hour(s))  Urine culture     Status: Abnormal   Collection Time: 02/14/21  7:49 PM   Specimen: Urine, Random  Result Value Ref Range Status   Specimen Description   Final    URINE, RANDOM Performed at [facility], [address], [place], [place] [address]    Special Requests   Final    NONE Performed at [facility], [address], [place], [place] [address]    Culture >=100,000 COLONIES/mL KLEBSIELLA PNEUMONIAE (A)  Final   Report Status 02/17/2021 FINAL  Final   Organism ID, Bacteria KLEBSIELLA PNEUMONIAE (A)  Final      Susceptibility   Klebsiella pneumoniae - MIC*    AMPICILLIN RESISTANT Resistant     CEFAZOLIN <=4 SENSITIVE Sensitive     CEFEPIME <=0.12 SENSITIVE Sensitive     CEFTRIAXONE <=0.25 SENSITIVE Sensitive     CIPROFLOXACIN <=0.25 SENSITIVE Sensitive      GENTAMICIN <=1 SENSITIVE Sensitive     IMIPENEM <=0.25 SENSITIVE Sensitive     NITROFURANTOIN 64 INTERMEDIATE Intermediate     TRIMETH/SULFA <=20 SENSITIVE Sensitive     AMPICILLIN/SULBACTAM <=2 SENSITIVE Sensitive     PIP/TAZO <=4 SENSITIVE Sensitive     * >=100,000 COLONIES/mL KLEBSIELLA PNEUMONIAE  Resp Panel by RT-PCR (Flu A&B, Covid) Nasopharyngeal Swab     Status: None   Collection Time: 02/14/21  7:53 PM   Specimen: Nasopharyngeal Swab; Nasopharyngeal(NP) swabs in vial transport medium  Result Value Ref Range Status   SARS Coronavirus 2 by RT PCR NEGATIVE NEGATIVE Final    Comment: (NOTE) SARS-CoV-2 target nucleic acids are NOT DETECTED.  The SARS-CoV-2 RNA is generally detectable in upper respiratory specimens during the acute phase of infection. The lowest concentration of SARS-CoV-2 viral copies this assay can detect is 138 copies/mL. A negative result does not preclude SARS-Cov-2 infection and should not be used as the sole basis for treatment or other patient management decisions. A negative result may occur with  improper specimen collection/handling, submission of specimen other than nasopharyngeal swab, presence of viral mutation(s) within the areas targeted by this assay, and inadequate number of viral copies(<138  copies/mL). A negative result must be combined with clinical observations, patient history, and epidemiological information. The expected result is Negative.  Fact Sheet for Patients:  EntrepreneurPulse.com.au  Fact Sheet for Healthcare Providers:  IncredibleEmployment.be  This test is no t yet approved or cleared by the [place] FDA and  has been authorized for detection and/or diagnosis of SARS-CoV-2 by FDA under an Emergency Use Authorization (EUA). This EUA will remain  in effect (meaning this test can be used) for the duration of the COVID-19 declaration under Section 564(b)(1) of the Act, 21 U.S.C.section  360bbb-3(b)(1), unless the authorization is terminated  or revoked sooner.       Influenza A by PCR NEGATIVE NEGATIVE Final   Influenza B by PCR NEGATIVE NEGATIVE Final    Comment: (NOTE) The Xpert Xpress SARS-CoV-2/FLU/RSV plus assay is intended as an aid in the diagnosis of influenza from Nasopharyngeal swab specimens and should not be used as a sole basis for treatment. Nasal washings and aspirates are unacceptable for Xpert Xpress SARS-CoV-2/FLU/RSV testing.  Fact Sheet for Patients: EntrepreneurPulse.com.au  Fact Sheet for Healthcare Providers: IncredibleEmployment.be  This test is not yet approved or cleared by the [place] FDA and has been authorized for detection and/or diagnosis of SARS-CoV-2 by FDA under an Emergency Use Authorization (EUA). This EUA will remain in effect (meaning this test can be used) for the duration of the COVID-19 declaration under Section 564(b)(1) of the Act, 21 U.S.C. section 360bbb-3(b)(1), unless the authorization is terminated or revoked.  Performed at [facility], [address] [place], [place] [address]   Culture, blood (Routine X 2) w Reflex to ID Panel     Status: None (Preliminary result)   Collection Time: 02/15/21  2:16 PM   Specimen: BLOOD  Result Value Ref Range Status   Specimen Description BLOOD LEFT ANTECUBITAL  Final   Special Requests   Final    BOTTLES DRAWN AEROBIC AND ANAEROBIC Blood Culture adequate volume   Culture   Final    NO GROWTH 2 DAYS Performed at [facility], [address] [place], [place] [address]    Report Status PENDING  Incomplete  Culture, blood (Routine X 2) w Reflex to ID Panel     Status: None (Preliminary result)   Collection Time: 02/15/21  2:16 PM   Specimen: BLOOD  Result Value Ref Range Status   Specimen Description BLOOD BLOOD LEFT HAND  Final   Special Requests   Final    BOTTLES DRAWN AEROBIC AND ANAEROBIC Blood Culture  adequate volume   Culture   Final    NO GROWTH 2 DAYS Performed at [facility], [address] [place], [place] [address]    Report Status PENDING  Incomplete    Pressure Injury 11/05/20 Buttocks Left Stage 2 -  Partial thickness loss of dermis presenting as a shallow open injury with a red, pink wound bed without slough. (Active)  11/05/20 1200  Location: Buttocks  Location Orientation: Left  Staging: Stage 2 -  Partial thickness loss of dermis presenting as a shallow open injury with a red, pink wound bed without slough.  Wound Description (Comments):   Present on Admission: -- (Unknown)     Pressure Injury 11/05/20 Buttocks Right Stage 1 -  Intact skin with non-blanchable redness of a localized area usually over a bony prominence. (Active)  11/05/20 1217  Location: Buttocks  Location Orientation: Right  Staging: Stage 1 -  Intact skin with non-blanchable redness of a localized  area usually over a bony prominence.  Wound Description (Comments):   Present on Admission:      Pressure Injury 11/11/20 Thigh Right;Posterior deep tissue injury (Active)  11/11/20 2330  Location: Thigh  Location Orientation: Right;Posterior  Staging:   Wound Description (Comments): deep tissue injury  Present on Admission:      Pressure Injury 02/14/21 Buttocks Stage 2 -  Partial thickness loss of dermis presenting as a shallow open injury with a red, pink wound bed without slough. (Active)  02/14/21 2315  Location: Buttocks  Location Orientation:   Staging: Stage 2 -  Partial thickness loss of dermis presenting as a shallow open injury with a red, pink wound bed without slough.  Wound Description (Comments):   Present on Admission: Yes          [place]   [facility] If 7PM-7AM, please contact night-coverage at [email or web address], Office  [phone]   02/17/2021, 2:13 PM  LOS: 2 days

## 2021-02-17 NOTE — TOC Progression Note (Signed)
Transition of Care [facility]) - Progression Note    Patient Details  Name: Alexis Rodriguez MRN: [id number] Date of Birth: 04-30-1942S  Transition of Care [facility]) CM/SW [place], RN Phone Number: 02/17/2021, 10:32 AM  Clinical Narrative:   [facility] contacted daughter via phone re: SNF.  Patient was recently discharged form [facility] last week.  Daughter requested patient return to [facility].  Per Magda Paganini in admissions, patient/family would need to present $2632.00 up front due to insurance coverage.  Spoke with daughter, who states she will call [facility] and discuss further.  Facility and daughter will contact TOC with plan after financial discussion.  TOC will follow through discharge..    Expected Discharge Plan: [place] Barriers to Discharge: Continued Medical Work up  Expected Discharge Plan and Services Expected Discharge Plan: [place] Choice:  (TBD HOme with home health vs SNF) Living arrangements for the past 2 months: Single Family Home                 DME Arranged:  (TBD)         HH Arranged:  (TBD)           Social Determinants of Health (SDOH) Interventions    Readmission Risk Interventions Readmission Risk Prevention Plan 02/16/2021  Transportation Screening Complete  PCP or Specialist Appt within 5-7 Days Complete  Home Care Screening Complete  Medication Review (RN CM) Complete  Some recent data might be hidden

## 2021-02-18 LAB — BASIC METABOLIC PANEL
Anion gap: 9 (ref 5–15)
BUN: 10 mg/dL (ref 8–23)
CO2: 25 mmol/L (ref 22–32)
Calcium: 8.6 mg/dL — ABNORMAL LOW (ref 8.9–10.3)
Chloride: 104 mmol/L (ref 98–111)
Creatinine, Ser: 1.39 mg/dL — ABNORMAL HIGH (ref 0.44–1.00)
GFR, Estimated: 38 mL/min — ABNORMAL LOW (ref >60)
Glucose, Bld: 153 mg/dL — ABNORMAL HIGH (ref 70–99)
Potassium: 3.9 mmol/L (ref 3.5–5.1)
Sodium: 138 mmol/L (ref 135–145)

## 2021-02-18 LAB — CBC
HCT: 36.9 % (ref 36.0–46.0)
Hemoglobin: 11.8 g/dL — ABNORMAL LOW (ref 12.0–15.0)
MCH: 28.9 pg (ref 26.0–34.0)
MCHC: 32 g/dL (ref 30.0–36.0)
MCV: 90.2 fL (ref 80.0–100.0)
Platelets: 297 10*3/uL (ref 150–400)
RBC: 4.09 MIL/uL (ref 3.87–5.11)
RDW: 14.5 % (ref 11.5–15.5)
WBC: 6 10*3/uL (ref 4.0–10.5)
nRBC: 0 % (ref 0.0–0.2)

## 2021-02-18 LAB — GLUCOSE, CAPILLARY
Glucose-Capillary: 138 mg/dL — ABNORMAL HIGH (ref 70–99)
Glucose-Capillary: 122 mg/dL — ABNORMAL HIGH (ref 70–99)
Glucose-Capillary: 147 mg/dL — ABNORMAL HIGH (ref 70–99)
Glucose-Capillary: 190 mg/dL — ABNORMAL HIGH (ref 70–99)

## 2021-02-18 MED ORDER — SODIUM CHLORIDE 0.9 % IV SOLN: INTRAVENOUS | Status: AC

## 2021-02-18 NOTE — TOC Progression Note (Signed)
Transition of Care [facility]) - Progression Note    Patient Details  Name: Alexis Rodriguez MRN: [id number] Date of Birth: 1941-02-08  Transition of Care [facility]) CM/SW [place], RN Phone Number: 02/18/2021, 3:48 PM  Clinical Narrative:   TOC received call from daughter, Shanasia.  She contacted [facility] again and confirmed that she cannot accommodate payment up front or with a payment plan. Daughter confirms that patient will return to daughter's home with [facility].  Daughter states patient will need EMS home as she has been a max assist while inpatient and daughter cannot transport home.  States that son-in-law will be home tomorrow and can assist with lifting patient after discharge.  Care team aware, as well as [facility], [place] aware.  TOC will follow through discharge    Expected Discharge Plan: [place] Barriers to Discharge: Continued Medical Work up  Expected Discharge Plan and Services Expected Discharge Plan: [place] Choice:  (TBD HOme with home health vs SNF) Living arrangements for the past 2 months: Single Family Home                 DME Arranged:  (TBD)         HH Arranged:  (TBD)           Social Determinants of Health (SDOH) Interventions    Readmission Risk Interventions Readmission Risk Prevention Plan 02/16/2021  Transportation Screening Complete  PCP or Specialist Appt within 5-7 Days Complete  Home Care Screening Complete  Medication Review (RN CM) Complete  Some recent data might be hidden

## 2021-02-18 NOTE — Progress Notes (Signed)
Physical Therapy Treatment Patient Details Name: Alexis Rodriguez MRN: [id number] DOB: 04-03-1941 Today's Date: 02/18/2021    History of Present Illness Pt is an 80 y/o F who presented to the ED with increased lethargy & confusion. Pt is currently being treated for metabolic encephalopathy likely from pneumonia in setting of advanced dementia. PMH: dementia, HTN, HLD, anemia, recurrent UTI, recently dx with insulinoma s/p work up for refractory hypoglycemia, pancreatitis    PT Comments    Pt was long sitting in bed upon arriving. She reports being wet from urination. Agrees to OOB activity to assist with hygiene cleaning. Required mod assist to exit bed, mod assist to stand, and min assist to ambulate very short distance. Pt has severe fear of falling and anxiety most limiting. Pt would benefit from SNF at DC however plan is to DC home with 24/7 care. Pt would benefit from continued skilled PT at DC to improve safe functional mobility while decreasing caregiver burden. She was long sitting in bed with bed alarm in place and call bell in reach. RN was aware of pt's abilities.    Follow Up Recommendations  SNF;Other (comment) (pt planning to DC home tomorrow with 24/7 care)     Equipment Recommendations  None recommended by PT       Precautions / Restrictions Precautions Precautions: Fall Restrictions Weight Bearing Restrictions: No    Mobility  Bed Mobility Overal bed mobility: Needs Assistance Bed Mobility: Supine to Sit     Supine to sit: Mod assist;HOB elevated     General bed mobility comments: Increased tie to perform. Vcs for technique and safety improvements    Transfers Overall transfer level: Needs assistance Equipment used: Rolling walker (2 wheeled) Transfers: Sit to/from Stand Sit to Stand: Mod assist;From elevated surface         General transfer comment: Pt performed STS 3 x EOB. Mod assist of one to achieve standing. Pt very fearful. anxiety limiting most of  session progression.  Ambulation/Gait Ambulation/Gait assistance: Min assist Gait Distance (Feet): 10 Feet Assistive device: Rolling walker (2 wheeled) Gait Pattern/deviations: Trunk flexed;Step-through pattern;Decreased step length - right;Decreased step length - left;Decreased stance time - right;Decreased stance time - left;Shuffle Gait velocity: decreased   General Gait Details: Pt was able to ambulate from R side of bed around bed to L side of bed. max encouragement and min assist.      Balance Overall balance assessment: Needs assistance Sitting-balance support: Bilateral upper extremity supported;Feet supported Sitting balance-Leahy Scale: Fair Sitting balance - Comments: close supervision static sitting EOB   Standing balance support: Bilateral upper extremity supported Standing balance-Leahy Scale: Poor Standing balance comment: pt is high fall risk not only due to weakness but also due to cognition/anxiety deficits      Cognition Arousal/Alertness: Awake/alert Behavior During Therapy: WFL for tasks assessed/performed Overall Cognitive Status: History of cognitive impairments - at baseline      General Comments: Pt is oriented to self only. was able to follow commands with max encouragement and increased time for processing             Pertinent Vitals/Pain Pain Assessment: 0-10 Pain Score: 5  Faces Pain Scale: Hurts little more Pain Location: BLE (calf) pain           PT Goals (current goals can now be found in the care plan section) Acute Rehab PT Goals Patient Stated Goal: none stated Progress towards PT goals: Progressing toward goals    Frequency    Min 2X/week  PT Plan Current plan remains appropriate    Co-evaluation     PT goals addressed during session: Mobility/safety with mobility;Balance;Proper use of DME;Strengthening/ROM        AM-PAC PT "6 Clicks" Mobility   Outcome Measure  Help needed turning from your back to your  side while in a flat bed without using bedrails?: A Little Help needed moving from lying on your back to sitting on the side of a flat bed without using bedrails?: A Lot Help needed moving to and from a bed to a chair (including a wheelchair)?: A Lot Help needed standing up from a chair using your arms (e.g., wheelchair or bedside chair)?: A Little Help needed to walk in hospital room?: A Little Help needed climbing 3-5 steps with a railing? : A Lot 6 Click Score: 15    End of Session Equipment Utilized During Treatment: Gait belt Activity Tolerance: Patient tolerated treatment well Patient left: with bed alarm set;with call bell/phone within reach Nurse Communication: Mobility status PT Visit Diagnosis: Unsteadiness on feet (R26.81);Difficulty in walking, not elsewhere classified (R26.2);Muscle weakness (generalized) (M62.81)     Time: 2334-3568 PT Time Calculation (min) (ACUTE ONLY): 16 min  Charges:  $Therapeutic Activity: 8-22 mins                     Julaine Fusi PTA 02/18/21, 4:43 PM

## 2021-02-18 NOTE — Progress Notes (Signed)
PROGRESS NOTE    Alexis Rodriguez  WGN:[id number] DOB: 1941-03-02 DOA: 02/14/2021 PCP: Perrin Maltese, MD     Brief Narrative:  Alexis Rodriguez is an 80 year old female with history of dementia, hypertension, hyperlipidemia, recurrent UTIs who was recently diagnosed with insulinoma status post refractory hypoglycemia on recent admission 12/2020 at [facility].  At that time she was deemed a poor candidate for surgery and medical management with dioxozide solution was opted for.  Patient's blood glucose has remained stable on this treatment.  She presented to the ED with increased lethargy and confusion.  Patient had 4 days of cough and congestion consistent she has become progressively lethargic and confused.  She was admitted for acute metabolic encephalopathy secondary to underlying UTI and pneumonia.  New events last 24 hours / Subjective: Patient in bed, finished eating breakfast.  She remains confused in regards to her hospital course.  She has no physical complaints on examination.  Assessment & Plan:   Active Problems:   Pressure injury of skin   Encephalopathy acute   Encephalopathy   Acute metabolic encephalopathy -Secondary to underlying UTI, pneumonia -Has underlying advanced dementia -Seems to be improved  Klebsiella UTI, present on admission -Rocephin  Interstitial pneumonia -Rocephin, azithromycin  AKI -Baseline creatinine 0.8 -IV fluid  Acute urinary retention -Started on Flomax.  Remove Foley for voiding trial  Recent diagnosis with insulinoma -Deemed not to be a surgical candidate -Continue diazoxide   Chronic diastolic heart failure -Lasix on hold, started on IV fluid today  Hyperlipidemia -Continue Crestor  Advanced dementia -Continue Namenda, Zyprexa   In agreement with assessment of the pressure ulcer as below:  Pressure Injury 02/14/21 Buttocks Stage 2 -  Partial thickness loss of dermis presenting as a shallow open injury with a red, pink wound bed  without slough. (Active)  02/14/21 2315  Location: Buttocks  Location Orientation:   Staging: Stage 2 -  Partial thickness loss of dermis presenting as a shallow open injury with a red, pink wound bed without slough.  Wound Description (Comments):   Present on Admission: Yes    DVT prophylaxis:  heparin injection 5,000 Units Start: 02/15/21 1000  Code Status: Full Family Communication: None at bedside, spoke with daughter over the phone  Disposition Plan:  Status is: Inpatient  Remains inpatient appropriate because:Altered mental status, Unsafe d/c plan and IV treatments appropriate due to intensity of illness or inability to take PO   Dispo: The patient is from: SNF              Anticipated d/c is to: Home, Unable to return back to SNF due to insurance coverage               Patient currently is not medically stable to d/c. Plan to discharge home 4/7    Difficult to place patient No     Antimicrobials:  Anti-infectives (From admission, onward)   Start     Dose/Rate Route Frequency Ordered Stop   02/16/21 2000  azithromycin (ZITHROMAX) tablet 500 mg        500 mg Oral Daily 02/16/21 1448 02/18/21 2359   02/15/21 2000  cefTRIAXone (ROCEPHIN) 1 g in sodium chloride 0.9 % 100 mL IVPB        1 g 200 mL/hr over 30 Minutes Intravenous Every 24 hours 02/15/21 1335 02/19/21 1959   02/14/21 2200  azithromycin (ZITHROMAX) 500 mg in sodium chloride 0.9 % 250 mL IVPB  Status:  Discontinued  500 mg 250 mL/hr over 60 Minutes Intravenous Every 24 hours 02/14/21 2110 02/16/21 1448   02/14/21 2000  cefTRIAXone (ROCEPHIN) 1 g in sodium chloride 0.9 % 100 mL IVPB        1 g 200 mL/hr over 30 Minutes Intravenous  Once 02/14/21 1948 02/14/21 2026        Objective: Vitals:   02/17/21 2132 02/18/21 0111 02/18/21 0416 02/18/21 0618  BP: 127/61 (!) 118/56 125/60   Pulse: 79 79 75   Resp: 17 17 18    Temp: 98.3 F (36.8 C) 98.1 F (36.7 C) 97.6 F (36.4 C)   TempSrc: Oral      SpO2: 99% 97% 98%   Weight:    81.8 kg  Height:        Intake/Output Summary (Last 24 hours) at 02/18/2021 1227 Last data filed at 02/18/2021 1030 Gross per 24 hour  Intake 240 ml  Output 200 ml  Net 40 ml   Filed Weights   02/15/21 0649 02/16/21 0509 02/18/21 0618  Weight: 84.3 kg 84.5 kg 81.8 kg    Examination:  General exam: Appears calm and comfortable  Respiratory system: Clear to auscultation. Respiratory effort normal. No respiratory distress. No conversational dyspnea.  Cardiovascular system: S1 & S2 heard, RRR. No murmurs. No pedal edema. Gastrointestinal system: Abdomen is nondistended, soft and nontender. Normal bowel sounds heard. Central nervous system: Alert and oriented. No focal neurological deficits. Speech clear.  Extremities: Symmetric in appearance  Skin: No rashes, lesions or ulcers on exposed skin  Psychiatry: Pleasantly confused    Data Reviewed: I have personally reviewed following labs and imaging studies  CBC: Recent Labs  Lab 02/14/21 1811 02/14/21 2140 02/15/21 0448 02/18/21 1043  WBC 8.2 7.7 6.8 6.0  NEUTROABS 5.2  --   --   --   HGB 11.5* 11.1* 11.3* 11.8*  HCT 35.9* 34.7* 35.1* 36.9  MCV 91.6 91.6 90.9 90.2  PLT 312 295 297 465   Basic Metabolic Panel: Recent Labs  Lab 02/14/21 1811 02/14/21 2140 02/15/21 0448 02/18/21 0528  NA 139 140 138 138  K 3.7 3.7 3.7 3.9  CL 101 104 105 104  CO2 26 26 24 25   GLUCOSE 158* 146* 138* 153*  BUN 12 12 11 10   CREATININE 1.30* 1.30*  1.26* 1.11* 1.39*  CALCIUM 8.4* 8.1* 8.2* 8.6*  MG  --  1.9  --   --    GFR: Estimated Creatinine Clearance: 32.7 mL/min (A) (by C-G formula based on SCr of 1.39 mg/dL (H)). Liver Function Tests: Recent Labs  Lab 02/14/21 1811 02/15/21 0448  AST 36 28  ALT 13 12  ALKPHOS 46 37*  BILITOT 0.7 0.6  PROT 6.6 6.2*  ALBUMIN 3.2* 3.0*   No results for input(s): LIPASE, AMYLASE in the last 168 hours. Recent Labs  Lab 02/14/21 2140  AMMONIA 22    Coagulation Profile: No results for input(s): INR, PROTIME in the last 168 hours. Cardiac Enzymes: No results for input(s): CKTOTAL, CKMB, CKMBINDEX, TROPONINI in the last 168 hours. BNP (last 3 results) No results for input(s): PROBNP in the last 8760 hours. HbA1C: No results for input(s): HGBA1C in the last 72 hours. CBG: Recent Labs  Lab 02/17/21 1210 02/17/21 1622 02/17/21 2129 02/18/21 0807 02/18/21 1144  GLUCAP 116* 176* 166* 138* 122*   Lipid Profile: No results for input(s): CHOL, HDL, LDLCALC, TRIG, CHOLHDL, LDLDIRECT in the last 72 hours. Thyroid Function Tests: No results for input(s): TSH, T4TOTAL, FREET4, T3FREE,  THYROIDAB in the last 72 hours. Anemia Panel: No results for input(s): VITAMINB12, FOLATE, FERRITIN, TIBC, IRON, RETICCTPCT in the last 72 hours. Sepsis Labs: Recent Labs  Lab 02/14/21 1811 02/14/21 2012  LATICACIDVEN 2.0* 1.4    Recent Results (from the past 240 hour(s))  Urine culture     Status: Abnormal   Collection Time: 02/14/21  7:49 PM   Specimen: Urine, Random  Result Value Ref Range Status   Specimen Description   Final    URINE, RANDOM Performed at [facility], [address] [place], [place] [address]    Special Requests   Final    NONE Performed at [facility], [address] [place], [place] [address]    Culture >=100,000 COLONIES/mL KLEBSIELLA PNEUMONIAE (A)  Final   Report Status 02/17/2021 FINAL  Final   Organism ID, Bacteria KLEBSIELLA PNEUMONIAE (A)  Final      Susceptibility   Klebsiella pneumoniae - MIC*    AMPICILLIN RESISTANT Resistant     CEFAZOLIN <=4 SENSITIVE Sensitive     CEFEPIME <=0.12 SENSITIVE Sensitive     CEFTRIAXONE <=0.25 SENSITIVE Sensitive     CIPROFLOXACIN <=0.25 SENSITIVE Sensitive     GENTAMICIN <=1 SENSITIVE Sensitive     IMIPENEM <=0.25 SENSITIVE Sensitive     NITROFURANTOIN 64 INTERMEDIATE Intermediate     TRIMETH/SULFA <=20 SENSITIVE Sensitive      AMPICILLIN/SULBACTAM <=2 SENSITIVE Sensitive     PIP/TAZO <=4 SENSITIVE Sensitive     * >=100,000 COLONIES/mL KLEBSIELLA PNEUMONIAE  Resp Panel by RT-PCR (Flu A&B, Covid) Nasopharyngeal Swab     Status: None   Collection Time: 02/14/21  7:53 PM   Specimen: Nasopharyngeal Swab; Nasopharyngeal(NP) swabs in vial transport medium  Result Value Ref Range Status   SARS Coronavirus 2 by RT PCR NEGATIVE NEGATIVE Final    Comment: (NOTE) SARS-CoV-2 target nucleic acids are NOT DETECTED.  The SARS-CoV-2 RNA is generally detectable in upper respiratory specimens during the acute phase of infection. The lowest concentration of SARS-CoV-2 viral copies this assay can detect is 138 copies/mL. A negative result does not preclude SARS-Cov-2 infection and should not be used as the sole basis for treatment or other patient management decisions. A negative result may occur with  improper specimen collection/handling, submission of specimen other than nasopharyngeal swab, presence of viral mutation(s) within the areas targeted by this assay, and inadequate number of viral copies(<138 copies/mL). A negative result must be combined with clinical observations, patient history, and epidemiological information. The expected result is Negative.  Fact Sheet for Patients:  EntrepreneurPulse.com.au  Fact Sheet for Healthcare Providers:  IncredibleEmployment.be  This test is no t yet approved or cleared by the [place] FDA and  has been authorized for detection and/or diagnosis of SARS-CoV-2 by FDA under an Emergency Use Authorization (EUA). This EUA will remain  in effect (meaning this test can be used) for the duration of the COVID-19 declaration under Section 564(b)(1) of the Act, 21 U.S.C.section 360bbb-3(b)(1), unless the authorization is terminated  or revoked sooner.       Influenza A by PCR NEGATIVE NEGATIVE Final   Influenza B by PCR NEGATIVE NEGATIVE Final     Comment: (NOTE) The Xpert Xpress SARS-CoV-2/FLU/RSV plus assay is intended as an aid in the diagnosis of influenza from Nasopharyngeal swab specimens and should not be used as a sole basis for treatment. Nasal washings and aspirates are unacceptable for Xpert Xpress SARS-CoV-2/FLU/RSV testing.  Fact Sheet for Patients: EntrepreneurPulse.com.au  Fact Sheet for  Healthcare Providers: IncredibleEmployment.be  This test is not yet approved or cleared by the [place] and has been authorized for detection and/or diagnosis of SARS-CoV-2 by FDA under an Emergency Use Authorization (EUA). This EUA will remain in effect (meaning this test can be used) for the duration of the COVID-19 declaration under Section 564(b)(1) of the Act, 21 U.S.C. section 360bbb-3(b)(1), unless the authorization is terminated or revoked.  Performed at [facility], [address] [place], [place] [address]   Culture, blood (Routine X 2) w Reflex to ID Panel     Status: None (Preliminary result)   Collection Time: 02/15/21  2:16 PM   Specimen: BLOOD  Result Value Ref Range Status   Specimen Description BLOOD LEFT ANTECUBITAL  Final   Special Requests   Final    BOTTLES DRAWN AEROBIC AND ANAEROBIC Blood Culture adequate volume   Culture   Final    NO GROWTH 3 DAYS Performed at [facility], [address], [place], [place] [address]    Report Status PENDING  Incomplete  Culture, blood (Routine X 2) w Reflex to ID Panel     Status: None (Preliminary result)   Collection Time: 02/15/21  2:16 PM   Specimen: BLOOD  Result Value Ref Range Status   Specimen Description BLOOD BLOOD LEFT HAND  Final   Special Requests   Final    BOTTLES DRAWN AEROBIC AND ANAEROBIC Blood Culture adequate volume   Culture   Final    NO GROWTH 3 DAYS Performed at [facility], [address], [place], [place] [address]    Report Status PENDING  Incomplete       Radiology Studies: No results found.    Scheduled Meds: . aspirin EC  81 mg Oral Daily  . azithromycin  500 mg Oral Daily  . Chlorhexidine Gluconate Cloth  6 each Topical Daily  . diazoxide  100 mg Oral Q8H  . ferrous sulfate  325 mg Oral Daily  . furosemide  20 mg Oral Daily  . glucose-Vitamin C  1 tablet Oral BID  . heparin injection (subcutaneous)  5,000 Units Subcutaneous Q8H  . memantine  28 mg Oral QHS  . OLANZapine  5 mg Oral QHS  . rosuvastatin  10 mg Oral Daily  . tamsulosin  0.4 mg Oral Daily   Continuous Infusions: . cefTRIAXone (ROCEPHIN)  IV 1 g (02/17/21 2125)     LOS: 3 days      Time spent: 35 minutes   Dessa Phi, DO [facility] 02/18/2021, 12:27 PM   Available via Epic secure chat 7am-7pm After these hours, please refer to coverage provider listed on [email or web address]

## 2021-02-19 DIAGNOSIS — R279 Unspecified lack of coordination: Secondary | ICD-10-CM | POA: Diagnosis not present

## 2021-02-19 DIAGNOSIS — I5032 Chronic diastolic (congestive) heart failure: Secondary | ICD-10-CM

## 2021-02-19 DIAGNOSIS — J189 Pneumonia, unspecified organism: Secondary | ICD-10-CM

## 2021-02-19 DIAGNOSIS — N390 Urinary tract infection, site not specified: Secondary | ICD-10-CM

## 2021-02-19 DIAGNOSIS — M6281 Muscle weakness (generalized): Secondary | ICD-10-CM | POA: Diagnosis not present

## 2021-02-19 DIAGNOSIS — R5381 Other malaise: Secondary | ICD-10-CM | POA: Diagnosis not present

## 2021-02-19 DIAGNOSIS — R338 Other retention of urine: Secondary | ICD-10-CM

## 2021-02-19 DIAGNOSIS — D137 Benign neoplasm of endocrine pancreas: Secondary | ICD-10-CM

## 2021-02-19 DIAGNOSIS — R69 Illness, unspecified: Secondary | ICD-10-CM | POA: Diagnosis not present

## 2021-02-19 LAB — BASIC METABOLIC PANEL
Anion gap: 8 (ref 5–15)
BUN: 9 mg/dL (ref 8–23)
CO2: 26 mmol/L (ref 22–32)
Calcium: 8.7 mg/dL — ABNORMAL LOW (ref 8.9–10.3)
Chloride: 105 mmol/L (ref 98–111)
Creatinine, Ser: 1.25 mg/dL — ABNORMAL HIGH (ref 0.44–1.00)
GFR, Estimated: 44 mL/min — ABNORMAL LOW (ref >60)
Glucose, Bld: 152 mg/dL — ABNORMAL HIGH (ref 70–99)
Potassium: 3.9 mmol/L (ref 3.5–5.1)
Sodium: 139 mmol/L (ref 135–145)

## 2021-02-19 LAB — CBC
HCT: 34.2 % — ABNORMAL LOW (ref 36.0–46.0)
Hemoglobin: 11 g/dL — ABNORMAL LOW (ref 12.0–15.0)
MCH: 28.9 pg (ref 26.0–34.0)
MCHC: 32.2 g/dL (ref 30.0–36.0)
MCV: 89.8 fL (ref 80.0–100.0)
Platelets: 259 10*3/uL (ref 150–400)
RBC: 3.81 MIL/uL — ABNORMAL LOW (ref 3.87–5.11)
RDW: 14.4 % (ref 11.5–15.5)
WBC: 6 10*3/uL (ref 4.0–10.5)
nRBC: 0 % (ref 0.0–0.2)

## 2021-02-19 LAB — GLUCOSE, CAPILLARY
Glucose-Capillary: 137 mg/dL — ABNORMAL HIGH (ref 70–99)
Glucose-Capillary: 166 mg/dL — ABNORMAL HIGH (ref 70–99)

## 2021-02-19 MED ORDER — DIAZOXIDE 50 MG/ML PO SUSP: 100.0000 mg | Freq: Three times a day (TID) | ORAL | 1 refills | Status: AC

## 2021-02-19 NOTE — TOC Transition Note (Signed)
Transition of Care [facility]) - CM/SW Discharge Note   Patient Details  Name: Alexis Rodriguez MRN: [id number] Date of Birth: 22-Jan-1941  Transition of Care [facility]) CM/SW Contact:  Pete Pelt, RN Phone Number: 02/19/2021, 10:07 AM   Clinical Narrative:   TOC spoke with daughter via phone, decision to transport home to daughter's house remains, states still cannot afford to pay copay for SNF.  Daughter stated she will provide 24 hour care for patient and is able to manage her physically at home.  Daughter declines need for DME.  Care team aware of discharge home vs SNF recommendation.  Patient will receive outpatient palliative care, family aware. Patient established with [facility]. [place] with [facility] notified of discharge today, staff will visit patient at home tomorrow.  No further questions or concerns from daughter.     Final next level of care: Home w Home Health Services Barriers to Discharge: Barriers Resolved   Patient Goals and CMS Choice   CMS Medicare.gov Compare Post Acute Care list provided to::  (N/A) Choice offered to / list presented to : NA  Discharge Placement PASRR number recieved:  (NA)              Patient to be transferred to facility by: Ambulance Name of family member notified: [place], [place] (Daughter)   [phone] [facility]) Patient and family notified of of transfer: 02/19/21  Discharge Plan and Services     Post Acute Care Choice:  (TBD HOme with home health vs SNF)          DME Arranged:  (N/A)         HH Arranged: RN HH Agency: [facility] Date [place]: 02/19/21 Time [place]: 0945 Representative spoke with at [place]: [place] ([place]) Interventions     Readmission Risk Interventions Readmission Risk Prevention Plan 02/16/2021  Transportation Screening Complete  PCP or Specialist Appt within 5-7 Days Complete  Home Care Screening Complete  Medication Review (RN CM)  Complete  Some recent data might be hidden

## 2021-02-19 NOTE — Plan of Care (Signed)
  Problem: Health Behavior/Discharge Planning: Goal: Ability to manage health-related needs will improve Outcome: Progressing   Problem: Safety: Goal: Ability to remain free from injury will improve Outcome: Progressing   Problem: Skin Integrity: Goal: Risk for impaired skin integrity will decrease Outcome: Progressing   

## 2021-02-19 NOTE — Plan of Care (Signed)
Patient is being discharged home by transport. I have discussed discharge info with daughter she is at home waiting for patietnt. Discharge instructions given to transporters to give to daughter. Iv's removed.

## 2021-02-19 NOTE — Progress Notes (Signed)
Spoke with daughter about discharge planning. Daughter picked up patietn personal belongings yesterday. She is aware of transport picking patient up at 1330 today.

## 2021-02-19 NOTE — Care Management Important Message (Signed)
Important Message  Patient Details  Name: Alexis Rodriguez MRN: [id number] Date of Birth: Sep 09, 1941   Medicare Important Message Given:  Yes     Juliann Pulse A Khoa Opdahl 02/19/2021, 9:49 AM

## 2021-02-19 NOTE — Discharge Summary (Signed)
Physician Discharge Summary  Alexis Rodriguez CLE:[id number] DOB: August 29, 1941 DOA: 02/14/2021  PCP: Perrin Maltese, MD  Admit date: 02/14/2021 Discharge date: 02/19/2021  Admitted From: Home Disposition:  Home with home health   Recommendations for Outpatient Follow-up:  1. Follow up with PCP in 1 week 2. Outpatient palliative care   Discharge Condition: Stable CODE STATUS: Full  Diet recommendation:  Diet Orders (From admission, onward)    Start     Ordered   02/14/21 2038  Diet regular Room service appropriate? Yes; Fluid consistency: Thin  Diet effective now       Question Answer Comment  Room service appropriate? Yes   Fluid consistency: Thin      02/14/21 2040         Brief/Interim Summary: Alexis Rodriguez is an 80 year old female with history of dementia, hypertension, hyperlipidemia, recurrent UTIs who was recently diagnosed with insulinoma status post refractory hypoglycemia on recent admission 12/2020 at [facility]. At that time she was deemed a poor candidate for surgery and medical management with dioxozide solution was opted for. Patient's blood glucose has remained stable on this treatment. She presented to the ED with increased lethargy and confusion. Patient had 4 days of cough and congestion consistent she has become progressively lethargic and confused.  She was admitted for acute metabolic encephalopathy secondary to underlying UTI and pneumonia. She was treated with antibiotics and completed treatments. Her encephalopathy improved and returned to baseline dementia confusion. On day of discharge, she had no further complaints, eating breakfast, and remained pleasantly confused.   Discharge Diagnoses:  Principal Problem:   Encephalopathy acute Active Problems:   Dementia ([place])   HLD (hyperlipidemia)   Pressure injury of skin   AKI (acute kidney injury) ([place])   Encephalopathy   Acute lower UTI   CAP (community acquired pneumonia)   Acute urinary retention    Insulinoma   Chronic diastolic CHF (congestive heart failure) (HCC)   Acute metabolic encephalopathy -Secondary to underlying UTI, pneumonia -Has underlying advanced dementia -Back to baseline now   Klebsiella UTI, present on admission -Completed antibiotics   Interstitial pneumonia -Completed antibiotics  -Remains on room air   AKI -Baseline creatinine 0.8 -Improving and able to take PO   Acute urinary retention -Started on Flomax. Foley removed   Recent diagnosis with insulinoma -Deemed not to be a surgical candidate -Continue diazoxide   Chronic diastolic heart failure -Lasix on hold due to elevated Cr   Hyperlipidemia -Continue Crestor  Advanced dementia -Continue Namenda, Zyprexa    In agreement with assessment of the pressure ulcer as below:  Pressure Injury 02/14/21 Buttocks Stage 2 -  Partial thickness loss of dermis presenting as a shallow open injury with a red, pink wound bed without slough. (Active)  02/14/21 2315  Location: Buttocks  Location Orientation:   Staging: Stage 2 -  Partial thickness loss of dermis presenting as a shallow open injury with a red, pink wound bed without slough.  Wound Description (Comments):   Present on Admission: Yes       Discharge Instructions  Discharge Instructions    Call MD for:  difficulty breathing, headache or visual disturbances   Complete by: As directed    Call MD for:  extreme fatigue   Complete by: As directed    Call MD for:  persistant dizziness or light-headedness   Complete by: As directed    Call MD for:  persistant nausea and vomiting   Complete by: As directed  Call MD for:  severe uncontrolled pain   Complete by: As directed    Call MD for:  temperature >100.4   Complete by: As directed    Discharge instructions   Complete by: As directed    You were cared for by a hospitalist during your hospital stay. If you have any questions about your discharge medications or the care you  received while you were in the hospital after you are discharged, you can call the unit and ask to speak with the hospitalist on call if the hospitalist that took care of you is not available. Once you are discharged, your primary care physician will handle any further medical issues. Please note that NO REFILLS for any discharge medications will be authorized once you are discharged, as it is imperative that you return to your primary care physician (or establish a relationship with a primary care physician if you do not have one) for your aftercare needs so that they can reassess your need for medications and monitor your lab values.   Increase activity slowly   Complete by: As directed    No wound care   Complete by: As directed      Allergies as of 02/19/2021      Reactions   Donepezil Hcl Other (See Comments)   Bradycardia and heart block!!!   Shellfish Allergy Anaphylaxis   Penicillins    Other reaction(s): Unknown      Medication List    TAKE these medications   acetaminophen 325 MG tablet Commonly known as: TYLENOL Take 2 tablets (650 mg total) by mouth every 4 (four) hours as needed for mild pain (or temp > 37.5 C (99.5 F)).   ascorbic acid 500 MG tablet Commonly known as: VITAMIN C Take 1 tablet (500 mg total) by mouth daily.   aspirin 81 MG EC tablet Take 1 tablet (81 mg total) by mouth daily. Swallow whole.   diazoxide 50 MG/ML suspension Commonly known as: PROGLYCEM Take 2 mLs (100 mg total) by mouth every 8 (eight) hours.   ferrous sulfate 325 (65 FE) MG tablet Take 325 mg by mouth daily.   glucose 4 GM chewable tablet Chew 1 tablet (4 g total) by mouth 2 (two) times daily.   Magnesium 250 MG Tabs Take 250 mg by mouth daily.   melatonin 5 MG Tabs Take 5 mg by mouth at bedtime.   memantine 28 MG Cp24 24 hr capsule Commonly known as: NAMENDA XR Take 1 capsule (28 mg total) by mouth at bedtime.   OLANZapine 5 MG tablet Commonly known as: ZYPREXA Take 5 mg  by mouth at bedtime.   rosuvastatin 10 MG tablet Commonly known as: CRESTOR Take 1 tablet (10 mg total) by mouth daily.   tamsulosin 0.4 MG Caps capsule Commonly known as: FLOMAX Take 1 capsule (0.4 mg total) by mouth daily.       Follow-up Information    Perrin Maltese, MD. Schedule an appointment as soon as possible for a visit in 1 week(s).   Specialty: Internal Medicine Contact information: [place] [address] [phone]              Allergies  Allergen Reactions  . Donepezil Hcl Other (See Comments)    Bradycardia and heart block!!!  . Shellfish Allergy Anaphylaxis  . Penicillins     Other reaction(s): Unknown    Consultations:  Palliative care    Procedures/Studies: CT HEAD WO CONTRAST  Result Date: 02/14/2021 CLINICAL DATA:  Weakness.  Mental status changes. EXAM: CT HEAD WITHOUT CONTRAST TECHNIQUE: Contiguous axial images were obtained from the base of the skull through the vertex without intravenous contrast. COMPARISON:  CT head 11/04/2020.  MRI brain 11/04/2020 FINDINGS: Brain: Diffuse cerebral atrophy. Mild ventricular dilatation consistent with central atrophy. Low-attenuation changes in the deep white matter consistent with small vessel ischemia. No mass-effect or midline shift. No abnormal extra-axial fluid collections. Gray-white matter junctions are distinct. Basal cisterns are not effaced. No acute intracranial hemorrhage. Vascular: Prominent intracranial arterial vascular calcifications. Skull: Normal. Negative for fracture or focal lesion. Sinuses/Orbits: No acute finding. Other: No significant changes since previous studies. IMPRESSION: 1. No acute intracranial abnormalities. 2. Chronic atrophy and small vessel ischemia. Electronically Signed   By: Lucienne Capers M.D.   On: 02/14/2021 21:35   CT CHEST WO CONTRAST  Result Date: 02/14/2021 CLINICAL DATA:  Abnormal x-ray. Interstitial infiltrates. Weakness since Tuesday. EXAM: CT  CHEST WITHOUT CONTRAST TECHNIQUE: Multidetector CT imaging of the chest was performed following the standard protocol without IV contrast. COMPARISON:  Chest radiograph 02/14/2021 FINDINGS: Cardiovascular: Normal heart size. No pericardial effusions. Coronary artery and aortic calcification. No aortic aneurysm. Mediastinum/Nodes: Prominent lymph nodes in the mediastinum without pathologic enlargement, likely reactive. Esophagus is decompressed. Small esophageal hiatal hernia. Lungs/Pleura: Small bilateral pleural effusions. Interstitial changes in the lungs may represent edema or interstitial pneumonitis. Bronchial wall thickening consistent with chronic bronchitis. No pneumothorax. Upper Abdomen: Cyst in the left lobe of the liver. Surgical absence of the gallbladder. No acute abnormalities are indicated. Musculoskeletal: Degenerative changes in the spine. No destructive bone lesions. IMPRESSION: 1. Small bilateral pleural effusions. Interstitial changes in the lungs may represent edema or interstitial pneumonitis. Bronchial wall thickening consistent with chronic bronchitis. 2. Small esophageal hiatal hernia. 3. Aortic atherosclerosis. Aortic Atherosclerosis (ICD10-I70.0). Electronically Signed   By: Lucienne Capers M.D.   On: 02/14/2021 21:33   DG Chest Portable 1 View  Result Date: 02/14/2021 CLINICAL DATA:  Weakness EXAM: PORTABLE CHEST 1 VIEW COMPARISON:  11/10/2020 FINDINGS: Peribronchial thickening and interstitial prominence. Heart is normal size. No effusions. No acute bony abnormality. IMPRESSION: Peribronchial thickening and interstitial prominence, likely bronchitic changes or atypical infection. Electronically Signed   By: Rolm Baptise M.D.   On: 02/14/2021 19:02   ECHOCARDIOGRAM COMPLETE  Result Date: 02/15/2021    ECHOCARDIOGRAM REPORT   Patient Name:   Alexis Rodriguez Date of Exam: 02/15/2021 Medical Rec #:  [id number]  Height:       63.0 in Accession #:    [id number] Weight:       185.8 lb Date of  Birth:  1941-07-18  BSA:          1.874 m Patient Age:    46 years   BP:           125/50 mmHg Patient Gender: F          HR:           90 bpm. Exam Location:  [facility] Procedure: 2D Echo, Cardiac Doppler and Color Doppler Indications:     Cardiomyopathy-Unspecified I42.9  History:         Patient has prior history of Echocardiogram examinations. Risk                  Factors:Hypertension.  Sonographer:     Alyse Low Roar Referring Phys:  [id number] Victoriano Lain A THOMAS Diagnosing Phys: Ida Rogue MD IMPRESSIONS  1. Left ventricular ejection fraction, by estimation, is 60 to 65%. The left ventricle  has normal function. The left ventricle has no regional wall motion abnormalities. Left ventricular diastolic parameters are consistent with Grade I diastolic dysfunction (impaired relaxation).  2. Right ventricular systolic function is normal. The right ventricular size is normal. There is normal pulmonary artery systolic pressure. The estimated right ventricular systolic pressure is 67.1 mmHg. FINDINGS  Left Ventricle: Left ventricular ejection fraction, by estimation, is 60 to 65%. The left ventricle has normal function. The left ventricle has no regional wall motion abnormalities. The left ventricular internal cavity size was normal in size. There is  no left ventricular hypertrophy. Left ventricular diastolic parameters are consistent with Grade I diastolic dysfunction (impaired relaxation). Right Ventricle: The right ventricular size is normal. No increase in right ventricular wall thickness. Right ventricular systolic function is normal. There is normal pulmonary artery systolic pressure. The tricuspid regurgitant velocity is 2.44 m/s, and  with an assumed right atrial pressure of 5 mmHg, the estimated right ventricular systolic pressure is 24.5 mmHg. Left Atrium: Left atrial size was normal in size. Right Atrium: Right atrial size was normal in size. Pericardium: There is no evidence of pericardial effusion. Mitral  Valve: The mitral valve is normal in structure. No evidence of mitral valve regurgitation. No evidence of mitral valve stenosis. Tricuspid Valve: The tricuspid valve is normal in structure. Tricuspid valve regurgitation is mild . No evidence of tricuspid stenosis. Aortic Valve: The aortic valve is normal in structure. Aortic valve regurgitation is not visualized. Mild to moderate aortic valve sclerosis/calcification is present, without any evidence of aortic stenosis. Aortic valve mean gradient measures 9.5 mmHg. Aortic valve peak gradient measures 17.0 mmHg. Aortic valve area, by VTI measures 1.28 cm. Pulmonic Valve: The pulmonic valve was normal in structure. Pulmonic valve regurgitation is not visualized. No evidence of pulmonic stenosis. Aorta: The aortic root is normal in size and structure. Venous: The inferior vena cava is normal in size with greater than 50% respiratory variability, suggesting right atrial pressure of 3 mmHg. IAS/Shunts: No atrial level shunt detected by color flow Doppler.  LEFT VENTRICLE PLAX 2D LVIDd:         4.00 cm  Diastology LVIDs:         2.79 cm  LV e' medial:    8.05 cm/s LV PW:         0.84 cm  LV E/e' medial:  16.4 LV IVS:        0.92 cm  LV e' lateral:   8.70 cm/s LVOT diam:     1.70 cm  LV E/e' lateral: 15.2 LV SV:         51 LV SV Index:   27 LVOT Area:     2.27 cm  RIGHT VENTRICLE RV Mid diam:    2.87 cm RV S prime:     15.30 cm/s TAPSE (M-mode): 2.3 cm LEFT ATRIUM             Index       RIGHT ATRIUM           Index LA diam:        3.60 cm 1.92 cm/m  RA Area:     10.90 cm LA Vol (A2C):   46.6 ml 24.86 ml/m RA Volume:   22.60 ml  12.06 ml/m LA Vol (A4C):   32.8 ml 17.50 ml/m LA Biplane Vol: 39.7 ml 21.18 ml/m  AORTIC VALVE                    PULMONIC  VALVE AV Area (Vmax):    1.48 cm     PV Vmax:        1.36 m/s AV Area (Vmean):   1.24 cm     PV Peak grad:   7.4 mmHg AV Area (VTI):     1.28 cm     RVOT Peak grad: 3 mmHg AV Vmax:           206.00 cm/s AV Vmean:           143.000 cm/s AV VTI:            0.397 m AV Peak Grad:      17.0 mmHg AV Mean Grad:      9.5 mmHg LVOT Vmax:         134.00 cm/s LVOT Vmean:        78.000 cm/s LVOT VTI:          0.224 m LVOT/AV VTI ratio: 0.56  AORTA Ao Root diam: 2.40 cm MITRAL VALVE                TRICUSPID VALVE MV Area (PHT): 6.12 cm     TR Peak grad:   23.8 mmHg MV Decel Time: 124 msec     TR Vmax:        244.00 cm/s MV E velocity: 132.00 cm/s MV A velocity: 178.00 cm/s  SHUNTS MV E/A ratio:  0.74         Systemic VTI:  0.22 m MV A Prime:    17.3 cm/s    Systemic Diam: 1.70 cm Ida Rogue MD Electronically signed by Ida Rogue MD Signature Date/Time: 02/15/2021/6:13:50 PM    Final        Discharge Exam: Vitals:   02/19/21 0406 02/19/21 0819  BP: 139/68 128/61  Pulse: 87 83  Resp: 14 20  Temp: 98.5 F (36.9 C) 99 F (37.2 C)  SpO2: 96% 94%    General: Pt is alert, awake, not in acute distress Cardiovascular: RRR, S1/S2 +, no edema Respiratory: CTA bilaterally, no wheezing, no rhonchi, no respiratory distress, no conversational dyspnea  Abdominal: Soft, NT, ND, bowel sounds + Extremities: no edema, no cyanosis Psych: Normal mood and affect, Dementia   The results of significant diagnostics from this hospitalization (including imaging, microbiology, ancillary and laboratory) are listed below for reference.     Microbiology: Recent Results (from the past 240 hour(s))  Urine culture     Status: Abnormal   Collection Time: 02/14/21  7:49 PM   Specimen: Urine, Random  Result Value Ref Range Status   Specimen Description   Final    URINE, RANDOM Performed at [facility], [address], [place], [place] [address]    Special Requests   Final    NONE Performed at [facility], [address] [place], [place] [address]    Culture >=100,000 COLONIES/mL KLEBSIELLA PNEUMONIAE (A)  Final   Report Status 02/17/2021 FINAL  Final   Organism ID, Bacteria KLEBSIELLA PNEUMONIAE (A)  Final       Susceptibility   Klebsiella pneumoniae - MIC*    AMPICILLIN RESISTANT Resistant     CEFAZOLIN <=4 SENSITIVE Sensitive     CEFEPIME <=0.12 SENSITIVE Sensitive     CEFTRIAXONE <=0.25 SENSITIVE Sensitive     CIPROFLOXACIN <=0.25 SENSITIVE Sensitive     GENTAMICIN <=1 SENSITIVE Sensitive     IMIPENEM <=0.25 SENSITIVE Sensitive     NITROFURANTOIN 64 INTERMEDIATE Intermediate     TRIMETH/SULFA <=20 SENSITIVE Sensitive  AMPICILLIN/SULBACTAM <=2 SENSITIVE Sensitive     PIP/TAZO <=4 SENSITIVE Sensitive     * >=100,000 COLONIES/mL KLEBSIELLA PNEUMONIAE  Resp Panel by RT-PCR (Flu A&B, Covid) Nasopharyngeal Swab     Status: None   Collection Time: 02/14/21  7:53 PM   Specimen: Nasopharyngeal Swab; Nasopharyngeal(NP) swabs in vial transport medium  Result Value Ref Range Status   SARS Coronavirus 2 by RT PCR NEGATIVE NEGATIVE Final    Comment: (NOTE) SARS-CoV-2 target nucleic acids are NOT DETECTED.  The SARS-CoV-2 RNA is generally detectable in upper respiratory specimens during the acute phase of infection. The lowest concentration of SARS-CoV-2 viral copies this assay can detect is 138 copies/mL. A negative result does not preclude SARS-Cov-2 infection and should not be used as the sole basis for treatment or other patient management decisions. A negative result may occur with  improper specimen collection/handling, submission of specimen other than nasopharyngeal swab, presence of viral mutation(s) within the areas targeted by this assay, and inadequate number of viral copies(<138 copies/mL). A negative result must be combined with clinical observations, patient history, and epidemiological information. The expected result is Negative.  Fact Sheet for Patients:  EntrepreneurPulse.com.au  Fact Sheet for Healthcare Providers:  IncredibleEmployment.be  This test is no t yet approved or cleared by the [place] FDA and  has been authorized for  detection and/or diagnosis of SARS-CoV-2 by FDA under an Emergency Use Authorization (EUA). This EUA will remain  in effect (meaning this test can be used) for the duration of the COVID-19 declaration under Section 564(b)(1) of the Act, 21 U.S.C.section 360bbb-3(b)(1), unless the authorization is terminated  or revoked sooner.       Influenza A by PCR NEGATIVE NEGATIVE Final   Influenza B by PCR NEGATIVE NEGATIVE Final    Comment: (NOTE) The Xpert Xpress SARS-CoV-2/FLU/RSV plus assay is intended as an aid in the diagnosis of influenza from Nasopharyngeal swab specimens and should not be used as a sole basis for treatment. Nasal washings and aspirates are unacceptable for Xpert Xpress SARS-CoV-2/FLU/RSV testing.  Fact Sheet for Patients: EntrepreneurPulse.com.au  Fact Sheet for Healthcare Providers: IncredibleEmployment.be  This test is not yet approved or cleared by the [place] FDA and has been authorized for detection and/or diagnosis of SARS-CoV-2 by FDA under an Emergency Use Authorization (EUA). This EUA will remain in effect (meaning this test can be used) for the duration of the COVID-19 declaration under Section 564(b)(1) of the Act, 21 U.S.C. section 360bbb-3(b)(1), unless the authorization is terminated or revoked.  Performed at [facility], [address] [place], [place] [address]   Culture, blood (Routine X 2) w Reflex to ID Panel     Status: None (Preliminary result)   Collection Time: 02/15/21  2:16 PM   Specimen: BLOOD  Result Value Ref Range Status   Specimen Description BLOOD LEFT ANTECUBITAL  Final   Special Requests   Final    BOTTLES DRAWN AEROBIC AND ANAEROBIC Blood Culture adequate volume   Culture   Final    NO GROWTH 4 DAYS Performed at [facility], [address], [place], [place] [address]    Report Status PENDING  Incomplete  Culture, blood (Routine X 2) w Reflex to ID Panel      Status: None (Preliminary result)   Collection Time: 02/15/21  2:16 PM   Specimen: BLOOD  Result Value Ref Range Status   Specimen Description BLOOD BLOOD LEFT HAND  Final   Special Requests   Final  BOTTLES DRAWN AEROBIC AND ANAEROBIC Blood Culture adequate volume   Culture   Final    NO GROWTH 4 DAYS Performed at [facility], [address] [place], [place] [address]    Report Status PENDING  Incomplete     Labs: BNP (last 3 results) Recent Labs    02/14/21 2140  BNP 401.0*   Basic Metabolic Panel: Recent Labs  Lab 02/14/21 1811 02/14/21 2140 02/15/21 0448 02/18/21 0528 02/19/21 0555  NA 139 140 138 138 139  K 3.7 3.7 3.7 3.9 3.9  CL 101 104 105 104 105  CO2 26 26 24 25 26   GLUCOSE 158* 146* 138* 153* 152*  BUN 12 12 11 10 9   CREATININE 1.30* 1.30*  1.26* 1.11* 1.39* 1.25*  CALCIUM 8.4* 8.1* 8.2* 8.6* 8.7*  MG  --  1.9  --   --   --    Liver Function Tests: Recent Labs  Lab 02/14/21 1811 02/15/21 0448  AST 36 28  ALT 13 12  ALKPHOS 46 37*  BILITOT 0.7 0.6  PROT 6.6 6.2*  ALBUMIN 3.2* 3.0*   No results for input(s): LIPASE, AMYLASE in the last 168 hours. Recent Labs  Lab 02/14/21 2140  AMMONIA 22   CBC: Recent Labs  Lab 02/14/21 1811 02/14/21 2140 02/15/21 0448 02/18/21 1043 02/19/21 0555  WBC 8.2 7.7 6.8 6.0 6.0  NEUTROABS 5.2  --   --   --   --   HGB 11.5* 11.1* 11.3* 11.8* 11.0*  HCT 35.9* 34.7* 35.1* 36.9 34.2*  MCV 91.6 91.6 90.9 90.2 89.8  PLT 312 295 297 297 259   Cardiac Enzymes: No results for input(s): CKTOTAL, CKMB, CKMBINDEX, TROPONINI in the last 168 hours. BNP: Invalid input(s): POCBNP CBG: Recent Labs  Lab 02/18/21 0807 02/18/21 1144 02/18/21 1601 02/18/21 2207 02/19/21 0820  GLUCAP 138* 122* 147* 190* 137*   D-Dimer No results for input(s): DDIMER in the last 72 hours. Hgb A1c No results for input(s): HGBA1C in the last 72 hours. Lipid Profile No results for input(s): CHOL, HDL, LDLCALC, TRIG,  CHOLHDL, LDLDIRECT in the last 72 hours. Thyroid function studies No results for input(s): TSH, T4TOTAL, T3FREE, THYROIDAB in the last 72 hours.  Invalid input(s): FREET3 Anemia work up No results for input(s): VITAMINB12, FOLATE, FERRITIN, TIBC, IRON, RETICCTPCT in the last 72 hours. Urinalysis    Component Value Date/Time   COLORURINE YELLOW (A) 02/14/2021 1812   APPEARANCEUR HAZY (A) 02/14/2021 1812   APPEARANCEUR TURBID 03/03/2015 1638   LABSPEC 1.014 02/14/2021 1812   LABSPEC 1.023 03/03/2015 1638   PHURINE 6.0 02/14/2021 1812   GLUCOSEU NEGATIVE 02/14/2021 1812   GLUCOSEU see comment 03/03/2015 1638   HGBUR NEGATIVE 02/14/2021 1812   BILIRUBINUR NEGATIVE 02/14/2021 1812   BILIRUBINUR see comment 03/03/2015 [place] 02/14/2021 1812   PROTEINUR NEGATIVE 02/14/2021 1812   NITRITE NEGATIVE 02/14/2021 1812   LEUKOCYTESUR SMALL (A) 02/14/2021 1812   LEUKOCYTESUR see comment 03/03/2015 1638   Sepsis Labs Invalid input(s): PROCALCITONIN,  WBC,  LACTICIDVEN Microbiology Recent Results (from the past 240 hour(s))  Urine culture     Status: Abnormal   Collection Time: 02/14/21  7:49 PM   Specimen: Urine, Random  Result Value Ref Range Status   Specimen Description   Final    URINE, RANDOM Performed at [facility], [address] [place], [place] [address]    Special Requests   Final    NONE Performed at [facility],  [place], [place] [address]    Culture >=100,000 COLONIES/mL KLEBSIELLA PNEUMONIAE (A)  Final   Report Status 02/17/2021 FINAL  Final   Organism ID, Bacteria KLEBSIELLA PNEUMONIAE (A)  Final      Susceptibility   Klebsiella pneumoniae - MIC*    AMPICILLIN RESISTANT Resistant     CEFAZOLIN <=4 SENSITIVE Sensitive     CEFEPIME <=0.12 SENSITIVE Sensitive     CEFTRIAXONE <=0.25 SENSITIVE Sensitive     CIPROFLOXACIN <=0.25 SENSITIVE Sensitive     GENTAMICIN <=1 SENSITIVE Sensitive     IMIPENEM <=0.25  SENSITIVE Sensitive     NITROFURANTOIN 64 INTERMEDIATE Intermediate     TRIMETH/SULFA <=20 SENSITIVE Sensitive     AMPICILLIN/SULBACTAM <=2 SENSITIVE Sensitive     PIP/TAZO <=4 SENSITIVE Sensitive     * >=100,000 COLONIES/mL KLEBSIELLA PNEUMONIAE  Resp Panel by RT-PCR (Flu A&B, Covid) Nasopharyngeal Swab     Status: None   Collection Time: 02/14/21  7:53 PM   Specimen: Nasopharyngeal Swab; Nasopharyngeal(NP) swabs in vial transport medium  Result Value Ref Range Status   SARS Coronavirus 2 by RT PCR NEGATIVE NEGATIVE Final    Comment: (NOTE) SARS-CoV-2 target nucleic acids are NOT DETECTED.  The SARS-CoV-2 RNA is generally detectable in upper respiratory specimens during the acute phase of infection. The lowest concentration of SARS-CoV-2 viral copies this assay can detect is 138 copies/mL. A negative result does not preclude SARS-Cov-2 infection and should not be used as the sole basis for treatment or other patient management decisions. A negative result may occur with  improper specimen collection/handling, submission of specimen other than nasopharyngeal swab, presence of viral mutation(s) within the areas targeted by this assay, and inadequate number of viral copies(<138 copies/mL). A negative result must be combined with clinical observations, patient history, and epidemiological information. The expected result is Negative.  Fact Sheet for Patients:  EntrepreneurPulse.com.au  Fact Sheet for Healthcare Providers:  IncredibleEmployment.be  This test is no t yet approved or cleared by the [place] FDA and  has been authorized for detection and/or diagnosis of SARS-CoV-2 by FDA under an Emergency Use Authorization (EUA). This EUA will remain  in effect (meaning this test can be used) for the duration of the COVID-19 declaration under Section 564(b)(1) of the Act, 21 U.S.C.section 360bbb-3(b)(1), unless the authorization is terminated   or revoked sooner.       Influenza A by PCR NEGATIVE NEGATIVE Final   Influenza B by PCR NEGATIVE NEGATIVE Final    Comment: (NOTE) The Xpert Xpress SARS-CoV-2/FLU/RSV plus assay is intended as an aid in the diagnosis of influenza from Nasopharyngeal swab specimens and should not be used as a sole basis for treatment. Nasal washings and aspirates are unacceptable for Xpert Xpress SARS-CoV-2/FLU/RSV testing.  Fact Sheet for Patients: EntrepreneurPulse.com.au  Fact Sheet for Healthcare Providers: IncredibleEmployment.be  This test is not yet approved or cleared by the [place] FDA and has been authorized for detection and/or diagnosis of SARS-CoV-2 by FDA under an Emergency Use Authorization (EUA). This EUA will remain in effect (meaning this test can be used) for the duration of the COVID-19 declaration under Section 564(b)(1) of the Act, 21 U.S.C. section 360bbb-3(b)(1), unless the authorization is terminated or revoked.  Performed at [facility], [address] [place], [place] [address]   Culture, blood (Routine X 2) w Reflex to ID Panel     Status: None (Preliminary result)   Collection Time: 02/15/21  2:16 PM   Specimen: BLOOD  Result Value Ref Range Status   Specimen Description BLOOD LEFT ANTECUBITAL  Final   Special Requests   Final    BOTTLES DRAWN AEROBIC AND ANAEROBIC Blood Culture adequate volume   Culture   Final    NO GROWTH 4 DAYS Performed at [facility], [address], [place], [place] [address]    Report Status PENDING  Incomplete  Culture, blood (Routine X 2) w Reflex to ID Panel     Status: None (Preliminary result)   Collection Time: 02/15/21  2:16 PM   Specimen: BLOOD  Result Value Ref Range Status   Specimen Description BLOOD BLOOD LEFT HAND  Final   Special Requests   Final    BOTTLES DRAWN AEROBIC AND ANAEROBIC Blood Culture adequate volume   Culture   Final    NO GROWTH 4  DAYS Performed at [facility], [address], [place], [place] [address]    Report Status PENDING  Incomplete     Patient was seen and examined on the day of discharge and was found to be in stable condition. Time coordinating discharge: 25 minutes including assessment and coordination of care, as well as examination of the patient.   SIGNED:  Dessa Phi, DO [facility] 02/19/2021, 9:17 AM

## 2021-02-20 DIAGNOSIS — N1831 Chronic kidney disease, stage 3a: Secondary | ICD-10-CM | POA: Diagnosis not present

## 2021-02-20 DIAGNOSIS — R339 Retention of urine, unspecified: Secondary | ICD-10-CM | POA: Diagnosis not present

## 2021-02-20 DIAGNOSIS — R69 Illness, unspecified: Secondary | ICD-10-CM | POA: Diagnosis not present

## 2021-02-20 DIAGNOSIS — I442 Atrioventricular block, complete: Secondary | ICD-10-CM | POA: Diagnosis not present

## 2021-02-20 DIAGNOSIS — I129 Hypertensive chronic kidney disease with stage 1 through stage 4 chronic kidney disease, or unspecified chronic kidney disease: Secondary | ICD-10-CM | POA: Diagnosis not present

## 2021-02-20 DIAGNOSIS — Z7982 Long term (current) use of aspirin: Secondary | ICD-10-CM | POA: Diagnosis not present

## 2021-02-20 DIAGNOSIS — E785 Hyperlipidemia, unspecified: Secondary | ICD-10-CM | POA: Diagnosis not present

## 2021-02-20 DIAGNOSIS — E162 Hypoglycemia, unspecified: Secondary | ICD-10-CM | POA: Diagnosis not present

## 2021-02-20 DIAGNOSIS — D509 Iron deficiency anemia, unspecified: Secondary | ICD-10-CM | POA: Diagnosis not present

## 2021-02-20 LAB — CULTURE, BLOOD (ROUTINE X 2)
Culture: NO GROWTH
Culture: NO GROWTH
Special Requests: ADEQUATE
Special Requests: ADEQUATE

## 2021-02-23 ENCOUNTER — Telehealth: Payer: Self-pay | Admitting: Primary Care

## 2021-02-23 NOTE — Telephone Encounter (Signed)
Spoke with patient's daughter, Lydiana Milley, regarding the Palliative referral/services and all questions were answered and she was in agreement with beginning services.  I have scheduled an In-home Consult forl 02/24/21 @ 11:30 AM.

## 2021-02-24 ENCOUNTER — Other Ambulatory Visit: Payer: Self-pay | Admitting: Primary Care

## 2021-02-24 ENCOUNTER — Other Ambulatory Visit: Payer: Self-pay

## 2021-02-24 DIAGNOSIS — D137 Benign neoplasm of endocrine pancreas: Secondary | ICD-10-CM

## 2021-02-24 DIAGNOSIS — G309 Alzheimer's disease, unspecified: Secondary | ICD-10-CM | POA: Diagnosis not present

## 2021-02-24 DIAGNOSIS — F0280 Dementia in other diseases classified elsewhere without behavioral disturbance: Secondary | ICD-10-CM

## 2021-02-24 DIAGNOSIS — G9340 Encephalopathy, unspecified: Secondary | ICD-10-CM | POA: Diagnosis not present

## 2021-02-24 DIAGNOSIS — Z515 Encounter for palliative care: Secondary | ICD-10-CM | POA: Diagnosis not present

## 2021-02-24 DIAGNOSIS — R69 Illness, unspecified: Secondary | ICD-10-CM | POA: Diagnosis not present

## 2021-02-24 DIAGNOSIS — F0150 Vascular dementia without behavioral disturbance: Secondary | ICD-10-CM

## 2021-02-24 DIAGNOSIS — I5032 Chronic diastolic (congestive) heart failure: Secondary | ICD-10-CM | POA: Diagnosis not present

## 2021-02-24 NOTE — Progress Notes (Signed)
Therapist, nutritional Palliative Care Consult Note Telephone: [phone]  Fax: [phone]    Date of encounter: 02/24/21 PATIENT NAME: Alexis Rodriguez [address] [place] [address]   [phone] (home)  DOB: 1941-04-02 MRN: [id number] PRIMARY CARE PROVIDER:    Margaretann Loveless, MD,  [address] [place] [address] [phone]  REFERRING PROVIDER:   Margaretann Loveless, MD [address] [place],  [place] [address] [phone]  RESPONSIBLE PARTY:    Contact Information    Name Relation Home Work Mobile   Alexis Rodriguez Daughter   [phone]   Alexis Rodriguez Relative   [phone]     I met face to face with patient and family in  home. Palliative Care was asked to follow this patient by consultation request of  Alexis Loveless, MD to address advance care planning and complex medical decision making. This is the initial visit.   ASSESSMENT AND PLAN / RECOMMENDATIONS:   Advance Care Planning/Goals of Care: Goals include to maximize quality of life and symptom management. Our advance care planning conversation included a discussion about:     The value and importance of advance care planning   Experiences with loved ones who have been seriously ill or have died   Exploration of personal, cultural or spiritual beliefs that might influence medical decisions   Exploration of goals of care in the event of a sudden injury or illness   Identification of a healthcare agent - Alexis Rodriguez, daughter  Review  of an  advance directive document .  CODE STATUS: FULL CODE  I met with patient and her daughter in their home. Daughter recounts that patient had a high degree of functionality until this past December when she began to have hypoglycemic episodes. She was found to have hypoglycemia in the face of glucose intake. 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  Since December she's had several hospitalizations and f/u with therapy.    Daughter states they had never  really done any advanced care planning. She states her mother would want to be prolonged with poor quality but that they do want to see what would be available as treatment at this time. She had some follow-ups with endocrine and neurology which had to be put off 2/2 subsequent hospitalizations. We discussed the recommendation that have been given of a chronic feeding tube; daughter states this was not really something they wanted to do and did not see it as a QoL option. She states that patient would be agitated pull out the tube, and didn't feel it was the route they wanted.   We discussed the MOST form and context of worsening disease process without ability to limit progression. Daughter had seen it before but did not want to withdraw care until more dx and tx options were discussed. She will review with her husband and we will complete next visit.  Symptom Management/Plan:  Caregiver strain: Daughter is sole caregiver not able to get assistance from her siblings for various reasons. She also has small children in the home. Her husband can help some but works full time. She is conflicted b/c she cannot be in multiple places such as hospital with her mom and her children cannot accompany her. We discussed income and resources for hiring caregivers. Income is limited but she is far over the medicaid cut off for any programs. We discussed LTC placement and I asked her to at least talk to DSS about qualifications to find out what  was available.   Dementia / delirium: Daughter reports increased cognitive changes, which could be delirium related to metabolic instability of hypoglycemia. She endorses mother is getting up in the night and looking for her, and has fallen. We discussed dementia and slow decline but sudden changes may be due to infection or in this case, the underlying hypoglycemia. Recommend endocrine f/u asap to ascertain any other available interventions.  Frequent UTI: Pt has history of UTI  and recently had a UTI with PNA for which she was hospitalized in early April. We discussed her obtaining urine dipsticks for early detection of uti. We discussed other modalities such as use of estrogen vaginal cream, prophylactic abx or proof of cure after known infection. Continue to monitor for recurrence.   Hypoglycemia: Daughter is monitoring. Has glucagon for use in emergency. Refer to endocrine for f/u. Resection not advised per hospital team.  Fall prevention in context of debility: Had been using walker and was reliable to ambulate before recent decline. Now cannot use it or forgets and has gotten up in the night  A few times. Recommend night sitter. Also recommend obtaining a fully electric hospital bed that will lower to the floor in order the keep her from falling at hs.   Follow up Palliative Care Visit: Palliative care will continue to follow for complex medical decision making, advance care planning, and clarification of goals. Return 6 weeks or prn.  I spent 75 minutes providing this consultation. More than 50% of the time in this consultation was spent in counseling and care coordination.  PPS: 40%  HOSPICE ELIGIBILITY/DIAGNOSIS: TBD  Chief Complaint: hypoglycemia and sequelae  HISTORY OF PRESENT ILLNESS:  Alexis Rodriguez is a 80 y.o. year old female  with dementia x 8 years and hypoglycemia x 3 months with uncinate insulinoma dx in 2/22. Pt determined not to be surgical candidate as this tumor situation would indicate a prolonged surgery and medical team did not feel it recommended. Other team suggested chronic feeding tube to force feed in order to rx hypoglycemia but family declined as outside their goals of care. Today she presents with issues of falls, caregiver strain and hypoglycemia in the context of insulinoma. Daughter monitors bg which have ranged around 30-50. She is able to eat food but also has glucagon for episodes. The sx she has manifest as confusion and weakness, and  daughter reports more delirium likely due to the  Insulin dysregulation. Pt will be seeing endocrine at [facility] for f/u and more recommendations.  History obtained from review of EMR, discussion with primary team, and interview with family, facility staff/caregiver and/or Ms. Verhoeven.  I reviewed available labs, medications, imaging, studies and related documents from the EMR.  Records reviewed and summarized above.   ROS  General: NAD EYES: denies vision changes ENMT: denies dysphagia Cardiovascular: denies chest pain, denies DOE Pulmonary: denies cough, denies increased SOB Abdomen: endorses good appetite, denies constipation, endorses continence of bowel GU: denies dysuria, endorses continence of urine MSK:  Endorses weakness,  + falls reported Skin: denies rashes or wounds Neurological: denies pain, endorses  insomnia Psych: Endorses positive mood Heme/lymph/immuno: denies bruises, abnormal bleeding  Physical Exam: Current and past weights: 174 lbs reported Constitutional: NAD General: frail appearing EYES: anicteric sclera, lids intact, no discharge  ENMT: intact hearing, oral mucous membranes moist, dentition intact Pulmonary: no increased work of breathing, no cough, room air Abdomen: intake 100%, no ascites MSK: mild  sarcopenia, moves all extremities, ambulatory with walker and stand by  Skin: warm and dry, no rashes or wounds on visible skin Neuro:  + generalized weakness,  Severe  cognitive impairment Psych: non-anxious affect, A and O x 1-2 Hem/lymph/immuno: no widespread bruising   CURRENT PROBLEM LIST:  Patient Active Problem List   Diagnosis Date Noted  . Acute lower UTI 02/19/2021  . CAP (community acquired pneumonia) 02/19/2021  . Acute urinary retention 02/19/2021  . Insulinoma 02/19/2021  . Chronic diastolic CHF (congestive heart failure) ([place]) 02/19/2021  . Encephalopathy 02/15/2021  . Encephalopathy acute 02/14/2021  . Heart block   . Third degree AV block  ([place])   . AKI (acute kidney injury) ([place])   . Pressure injury of skin 11/06/2020  . Stroke-like symptoms 11/04/2020  . Depression 11/04/2020  . HTN (hypertension)   . Hypoglycemia   . Dementia ([place])   . HLD (hyperlipidemia)   . Iron deficiency anemia   . CKD (chronic kidney disease), stage IIIa   . 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 Stroke-like symptoms 11/04/2020  . HTN (hypertension)   . Hypoglycemia   . Dementia ([place])   . HLD (hyperlipidemia)   . Iron deficiency anemia   . CKD (chronic kidney disease), stage IIIa   . Depression 11/04/2020  . Pressure injury of skin 11/06/2020  . Third degree AV block ([place])   . AKI (acute kidney injury) ([place])   . Heart block   . Encephalopathy acute 02/14/2021  . Encephalopathy 02/15/2021  . Acute lower UTI 02/19/2021  . CAP (community acquired pneumonia) 02/19/2021  . Acute urinary retention 02/19/2021  . Insulinoma 02/19/2021  . Chronic diastolic CHF (congestive heart failure) ([place]) 02/19/2021   Resolved Ambulatory Problems    Diagnosis Date Noted  . No Resolved Ambulatory Problems   Past Medical History:  Diagnosis Date  . Hypertension   . Pancreatitis    SOCIAL HX:  Social History   Tobacco Use  . Smoking status: Never Smoker  . Smokeless tobacco: Never Used  Substance Use Topics  . Alcohol use: Never   FAMILY HX:  Family History  Problem Relation Age of Onset  . Diabetes Mellitus II Mother   . Atrial fibrillation Sister      ALLERGIES:  Allergies  Allergen Reactions  . Donepezil Hcl Other (See Comments)    Bradycardia and heart block!!!  . Shellfish Allergy Anaphylaxis  . Penicillins     Other reaction(s): Unknown     PERTINENT MEDICATIONS:  Outpatient Encounter Medications as of 02/24/2021  Medication Sig  . acetaminophen (TYLENOL) 325 MG tablet Take 2 tablets (650 mg total) by mouth every 4 (four)  hours as needed for mild pain (or temp > 37.5 C (99.5 F)). (Patient not taking: Reported on 02/14/2021)  . ascorbic acid (VITAMIN C) 500 MG tablet Take 1 tablet (500 mg total) by mouth daily. (Patient not taking: Reported on 02/14/2021)  . aspirin EC 81 MG EC tablet Take 1 tablet (81 mg total) by mouth daily. Swallow whole.  . diazoxide (PROGLYCEM) 50 MG/ML suspension Take 2 mLs (100 mg total) by mouth every 8 (eight) hours.  . ferrous sulfate 325 (65 FE) MG tablet Take 325 mg by mouth daily.  Marland Kitchen glucose 4 GM chewable tablet Chew 1 tablet (4 g total) by mouth 2 (two) times daily.  . Magnesium 250 MG TABS Take 250 mg by mouth daily.  . melatonin 5 MG TABS Take 5  mg by mouth at bedtime.  . memantine (NAMENDA XR) 28 MG CP24 24 hr capsule Take 1 capsule (28 mg total) by mouth at bedtime.  Marland Kitchen OLANZapine (ZYPREXA) 5 MG tablet Take 5 mg by mouth at bedtime.  . rosuvastatin (CRESTOR) 10 MG tablet Take 1 tablet (10 mg total) by mouth daily.  . tamsulosin (FLOMAX) 0.4 MG CAPS capsule Take 1 capsule (0.4 mg total) by mouth daily.   No facility-administered encounter medications on file as of 02/24/2021.    Thank you for the opportunity to participate in the care of Ms. Sabine.  The palliative care team will continue to follow. Please call our office at [phone] if we can be of additional assistance.   Jason Coop, NP , DNP, MPH, AGPCNP-BC, ACHPN  COVID-19 PATIENT SCREENING TOOL Asked and negative response unless otherwise noted:   Have you had symptoms of covid, tested positive or been in contact with someone with symptoms/positive test in the past 5-10 days?

## 2021-03-02 ENCOUNTER — Other Ambulatory Visit: Payer: Self-pay | Admitting: Primary Care

## 2021-03-02 ENCOUNTER — Other Ambulatory Visit: Payer: Self-pay

## 2021-03-02 DIAGNOSIS — F0392 Unspecified dementia, unspecified severity, with psychotic disturbance: Secondary | ICD-10-CM

## 2021-03-02 DIAGNOSIS — F0150 Vascular dementia without behavioral disturbance: Secondary | ICD-10-CM

## 2021-03-02 DIAGNOSIS — R69 Illness, unspecified: Secondary | ICD-10-CM | POA: Diagnosis not present

## 2021-03-02 DIAGNOSIS — F0390 Unspecified dementia without behavioral disturbance: Secondary | ICD-10-CM | POA: Diagnosis not present

## 2021-03-02 DIAGNOSIS — G309 Alzheimer's disease, unspecified: Secondary | ICD-10-CM | POA: Diagnosis not present

## 2021-03-02 DIAGNOSIS — Z515 Encounter for palliative care: Secondary | ICD-10-CM

## 2021-03-02 DIAGNOSIS — R443 Hallucinations, unspecified: Secondary | ICD-10-CM | POA: Diagnosis not present

## 2021-03-02 DIAGNOSIS — F5105 Insomnia due to other mental disorder: Secondary | ICD-10-CM | POA: Insufficient documentation

## 2021-03-02 DIAGNOSIS — F0280 Dementia in other diseases classified elsewhere without behavioral disturbance: Secondary | ICD-10-CM | POA: Diagnosis not present

## 2021-03-02 NOTE — Progress Notes (Addendum)
Designer, jewellery Palliative Care Consult Note Telephone: [phone]  Fax: [phone]   Due to the COVID-19 crisis, this visit was done via telemedicine from my office and it was initiated and consent by this patient and or family.  I connected with  Shambria Camerer on 03/02/21 by a video enabled telemedicine application and verified that I am speaking with the correct person using two identifiers.   I discussed the limitations of evaluation and management by telemedicine. The patient expressed understanding and agreed to proceed.   Date of encounter: 03/02/21 PATIENT NAME: Alexis Rodriguez [id number]   [phone] (home)  DOB: 1941/03/06 MRN: [id number] PRIMARY CARE PROVIDER:    Danelle Berry, NP,  [place] [address] [phone]  REFERRING PROVIDER:   Danelle Berry, NP [place],  [place] [address] [phone]  RESPONSIBLE PARTY:    Contact Information    Name Relation Home Work Mobile   Nuala, Chiles Daughter   [phone]   Ferrel Logan Relative   [phone]       Palliative Care was asked to follow this patient by consultation request of  Danelle Berry, NP to address advance care planning and complex medical decision making. This is a follow up visit.                                   ASSESSMENT AND PLAN / RECOMMENDATIONS:   Advance Care Planning/Goals of Care: Goals include to maximize quality of life and symptom management. Our advance care planning conversation included a discussion about:     Experiences with loved ones who have been seriously ill or have died   Exploration of personal, cultural or spiritual beliefs that might influence medical decisions   Exploration of goals of care in the event of a sudden injury or illness   CODE STATUS: FULL, to be discussed in ACP meeting.  Symptom Management/Plan:  Continued insomnia, night time wandering; Discussed adjusting  medications, d/c of olanzipine, increase of olanzipine,  and use of trazodone. Also discussed the considerations for monitoring pt during medication changes. Discussed night time sitters, daughter sleeping downstairs for a few nights to assess med effect, changing DME to hospital bed. She also has used a recliner to sleep on occasion.  Daughter would like to increase dose of  olanzapine due to increased hallucinations which have been present for a few weeks. She would also like to try trazodone for sleep and can monitor for falls and  effectiveness. Rx olanzapine 7.5 mg po at hs, and trazodone 25 mg po q hs. I also d/c sertraline due to addition of trazodone.  Follow up Palliative Care Visit: Palliative care will continue to follow for complex medical decision making, advance care planning, and clarification of goals. Daughter to call with report in 7-14 days, Return visit  4-6 weeks or prn.  I spent 40 minutes providing this consultation. More than 50% of the time in this consultation was spent in counseling and care coordination.  PPS: 30%  HOSPICE ELIGIBILITY/DIAGNOSIS: TBD  Chief Complaint: insomnia, nocturnal wandering  HISTORY OF PRESENT ILLNESS:  Alexis Rodriguez is a 80 y.o. year old female  with advanced dementia, hypoglycemia d/t insulinoma.  History obtained from review of EMR, discussion with primary team, and interview with family, facility staff/caregiver and/or Ms. Ballengee.  I reviewed available labs, medications, imaging, studies and related  documents from the EMR.  Records reviewed and summarized above.   Outpatient Encounter Medications as of 03/02/2021  Medication Sig  . OLANZapine (ZYPREXA) 2.5 MG tablet Take 2.5 mg by mouth at bedtime. For total of 7.5 mg q hs  . OLANZapine (ZYPREXA) 5 MG tablet Take 5 mg by mouth at bedtime.  . traZODone (DESYREL) 50 MG tablet Take 25 mg by mouth at bedtime.  Marland Kitchen acetaminophen (TYLENOL) 325 MG tablet Take 2 tablets (650 mg total) by mouth every 4  (four) hours as needed for mild pain (or temp > 37.5 C (99.5 F)).  Marland Kitchen ascorbic acid (VITAMIN C) 500 MG tablet Take 1 tablet (500 mg total) by mouth daily.  Marland Kitchen aspirin EC 81 MG EC tablet Take 1 tablet (81 mg total) by mouth daily. Swallow whole.  . diazoxide (PROGLYCEM) 50 MG/ML suspension Take 2 mLs (100 mg total) by mouth every 8 (eight) hours.  . ferrous sulfate 325 (65 FE) MG tablet Take 325 mg by mouth daily.  Marland Kitchen glucose 4 GM chewable tablet Chew 1 tablet (4 g total) by mouth 2 (two) times daily.  . Magnesium 250 MG TABS Take 250 mg by mouth daily.  . melatonin 5 MG TABS Take 5 mg by mouth at bedtime.  . memantine (NAMENDA XR) 28 MG CP24 24 hr capsule Take 1 capsule (28 mg total) by mouth at bedtime.  . rosuvastatin (CRESTOR) 10 MG tablet Take 1 tablet (10 mg total) by mouth daily.  . tamsulosin (FLOMAX) 0.4 MG CAPS capsule Take 1 capsule (0.4 mg total) by mouth daily.  . [DISCONTINUED] sertraline (ZOLOFT) 25 MG tablet Take 25 mg by mouth daily.   No facility-administered encounter medications on file as of 03/02/2021.     ROS/ family  General: NAD Cardiovascular: denies chest pain, denies DOE Pulmonary: denies cough, denies increased SOB Abdomen: endorses good appetite, endorses BG WNL MSK:  + weakness,  no falls reported Skin: denies rashes or wounds Neurological: denies pain, endorses insomnia Psych: Endorses positive mood, endorses increased hallucinations  Physical Exam: Current and past weights: 180 lbs reported Constitutional: NAD General: frail appearing,obese  EYES: anicteric sclera, lids intact, no discharge  Pulmonary: no increased work of breathing, no cough, room air MSK: mod sarcopenia, moves all extremities, ambulatory with help, fall risk Skin: no rashes or wounds on visible skin Neuro:  +generalized weakness,  Severe cognitive impairment Psych: non-anxious affect, A and O x 1   Thank you for the opportunity to participate in the care of Alexis Rodriguez.  The  palliative care team will continue to follow. Please call our office at [phone] if we can be of additional assistance.   Jason Coop, NP , DNP, MPH, AGPCNP-BC, [facility]

## 2021-03-02 NOTE — Addendum Note (Signed)
Addended by: Estevan Oaks MCKELVEY on: 03/02/2021 06:37 PM   Modules accepted: Orders

## 2021-03-03 DIAGNOSIS — I129 Hypertensive chronic kidney disease with stage 1 through stage 4 chronic kidney disease, or unspecified chronic kidney disease: Secondary | ICD-10-CM | POA: Diagnosis not present

## 2021-03-03 DIAGNOSIS — K869 Disease of pancreas, unspecified: Secondary | ICD-10-CM | POA: Diagnosis not present

## 2021-03-03 DIAGNOSIS — D137 Benign neoplasm of endocrine pancreas: Secondary | ICD-10-CM | POA: Diagnosis not present

## 2021-03-03 DIAGNOSIS — D378 Neoplasm of uncertain behavior of other specified digestive organs: Secondary | ICD-10-CM | POA: Diagnosis not present

## 2021-03-03 DIAGNOSIS — G309 Alzheimer's disease, unspecified: Secondary | ICD-10-CM | POA: Diagnosis not present

## 2021-03-03 DIAGNOSIS — N1831 Chronic kidney disease, stage 3a: Secondary | ICD-10-CM | POA: Diagnosis not present

## 2021-03-03 DIAGNOSIS — E162 Hypoglycemia, unspecified: Secondary | ICD-10-CM | POA: Diagnosis not present

## 2021-03-03 DIAGNOSIS — R69 Illness, unspecified: Secondary | ICD-10-CM | POA: Diagnosis not present

## 2021-03-03 DIAGNOSIS — D631 Anemia in chronic kidney disease: Secondary | ICD-10-CM | POA: Diagnosis not present

## 2021-03-03 DIAGNOSIS — M5136 Other intervertebral disc degeneration, lumbar region: Secondary | ICD-10-CM | POA: Diagnosis not present

## 2021-03-05 DIAGNOSIS — N1831 Chronic kidney disease, stage 3a: Secondary | ICD-10-CM | POA: Diagnosis not present

## 2021-03-05 DIAGNOSIS — G309 Alzheimer's disease, unspecified: Secondary | ICD-10-CM | POA: Diagnosis not present

## 2021-03-05 DIAGNOSIS — M5136 Other intervertebral disc degeneration, lumbar region: Secondary | ICD-10-CM | POA: Diagnosis not present

## 2021-03-05 DIAGNOSIS — K869 Disease of pancreas, unspecified: Secondary | ICD-10-CM | POA: Diagnosis not present

## 2021-03-05 DIAGNOSIS — I129 Hypertensive chronic kidney disease with stage 1 through stage 4 chronic kidney disease, or unspecified chronic kidney disease: Secondary | ICD-10-CM | POA: Diagnosis not present

## 2021-03-05 DIAGNOSIS — D631 Anemia in chronic kidney disease: Secondary | ICD-10-CM | POA: Diagnosis not present

## 2021-03-05 DIAGNOSIS — E162 Hypoglycemia, unspecified: Secondary | ICD-10-CM | POA: Diagnosis not present

## 2021-03-05 DIAGNOSIS — D137 Benign neoplasm of endocrine pancreas: Secondary | ICD-10-CM | POA: Diagnosis not present

## 2021-03-05 DIAGNOSIS — D378 Neoplasm of uncertain behavior of other specified digestive organs: Secondary | ICD-10-CM | POA: Diagnosis not present

## 2021-03-05 DIAGNOSIS — R69 Illness, unspecified: Secondary | ICD-10-CM | POA: Diagnosis not present

## 2021-03-06 DIAGNOSIS — E162 Hypoglycemia, unspecified: Secondary | ICD-10-CM | POA: Diagnosis not present

## 2021-03-06 DIAGNOSIS — M6281 Muscle weakness (generalized): Secondary | ICD-10-CM | POA: Diagnosis not present

## 2021-03-06 DIAGNOSIS — N186 End stage renal disease: Secondary | ICD-10-CM | POA: Diagnosis not present

## 2021-03-06 DIAGNOSIS — R262 Difficulty in walking, not elsewhere classified: Secondary | ICD-10-CM | POA: Diagnosis not present

## 2021-03-10 DIAGNOSIS — I129 Hypertensive chronic kidney disease with stage 1 through stage 4 chronic kidney disease, or unspecified chronic kidney disease: Secondary | ICD-10-CM | POA: Diagnosis not present

## 2021-03-10 DIAGNOSIS — N1831 Chronic kidney disease, stage 3a: Secondary | ICD-10-CM | POA: Diagnosis not present

## 2021-03-10 DIAGNOSIS — R69 Illness, unspecified: Secondary | ICD-10-CM | POA: Diagnosis not present

## 2021-03-10 DIAGNOSIS — M5136 Other intervertebral disc degeneration, lumbar region: Secondary | ICD-10-CM | POA: Diagnosis not present

## 2021-03-10 DIAGNOSIS — D378 Neoplasm of uncertain behavior of other specified digestive organs: Secondary | ICD-10-CM | POA: Diagnosis not present

## 2021-03-10 DIAGNOSIS — D137 Benign neoplasm of endocrine pancreas: Secondary | ICD-10-CM | POA: Diagnosis not present

## 2021-03-10 DIAGNOSIS — E162 Hypoglycemia, unspecified: Secondary | ICD-10-CM | POA: Diagnosis not present

## 2021-03-10 DIAGNOSIS — K869 Disease of pancreas, unspecified: Secondary | ICD-10-CM | POA: Diagnosis not present

## 2021-03-10 DIAGNOSIS — G309 Alzheimer's disease, unspecified: Secondary | ICD-10-CM | POA: Diagnosis not present

## 2021-03-10 DIAGNOSIS — D631 Anemia in chronic kidney disease: Secondary | ICD-10-CM | POA: Diagnosis not present

## 2021-03-11 ENCOUNTER — Telehealth: Payer: Self-pay

## 2021-03-11 DIAGNOSIS — R69 Illness, unspecified: Secondary | ICD-10-CM | POA: Diagnosis not present

## 2021-03-11 DIAGNOSIS — K869 Disease of pancreas, unspecified: Secondary | ICD-10-CM | POA: Diagnosis not present

## 2021-03-11 DIAGNOSIS — M5136 Other intervertebral disc degeneration, lumbar region: Secondary | ICD-10-CM | POA: Diagnosis not present

## 2021-03-11 DIAGNOSIS — D378 Neoplasm of uncertain behavior of other specified digestive organs: Secondary | ICD-10-CM | POA: Diagnosis not present

## 2021-03-11 DIAGNOSIS — G309 Alzheimer's disease, unspecified: Secondary | ICD-10-CM | POA: Diagnosis not present

## 2021-03-11 DIAGNOSIS — D631 Anemia in chronic kidney disease: Secondary | ICD-10-CM | POA: Diagnosis not present

## 2021-03-11 DIAGNOSIS — E162 Hypoglycemia, unspecified: Secondary | ICD-10-CM | POA: Diagnosis not present

## 2021-03-11 DIAGNOSIS — D137 Benign neoplasm of endocrine pancreas: Secondary | ICD-10-CM | POA: Diagnosis not present

## 2021-03-11 DIAGNOSIS — I129 Hypertensive chronic kidney disease with stage 1 through stage 4 chronic kidney disease, or unspecified chronic kidney disease: Secondary | ICD-10-CM | POA: Diagnosis not present

## 2021-03-11 DIAGNOSIS — N1831 Chronic kidney disease, stage 3a: Secondary | ICD-10-CM | POA: Diagnosis not present

## 2021-03-11 NOTE — Telephone Encounter (Signed)
Received message to call patient's daughter, Duggar. Furrow shared that she noted changes in patient and feels that she has a UTI. Daughter noted that patient has increased weakness, worsening confusion and her urine is cloudy. Patient is afebrile, with out nausea or vomiting. Patient has allergy to PCN. Preferred Pharmacy is [facility] [place]. Will reach out to the PCP and update Palliative NP.

## 2021-03-11 NOTE — Telephone Encounter (Signed)
Phone call placed to PCP office to report update that was received from patient's daughter. This RN transferred to PCP nurse who will relay information to PCP

## 2021-03-12 ENCOUNTER — Encounter: Payer: Self-pay | Admitting: Primary Care

## 2021-03-12 DIAGNOSIS — G309 Alzheimer's disease, unspecified: Secondary | ICD-10-CM | POA: Diagnosis not present

## 2021-03-12 DIAGNOSIS — E162 Hypoglycemia, unspecified: Secondary | ICD-10-CM | POA: Diagnosis not present

## 2021-03-12 DIAGNOSIS — D631 Anemia in chronic kidney disease: Secondary | ICD-10-CM | POA: Diagnosis not present

## 2021-03-12 DIAGNOSIS — R69 Illness, unspecified: Secondary | ICD-10-CM | POA: Diagnosis not present

## 2021-03-12 DIAGNOSIS — D378 Neoplasm of uncertain behavior of other specified digestive organs: Secondary | ICD-10-CM | POA: Diagnosis not present

## 2021-03-12 DIAGNOSIS — K869 Disease of pancreas, unspecified: Secondary | ICD-10-CM | POA: Diagnosis not present

## 2021-03-12 DIAGNOSIS — M5136 Other intervertebral disc degeneration, lumbar region: Secondary | ICD-10-CM | POA: Diagnosis not present

## 2021-03-12 DIAGNOSIS — I129 Hypertensive chronic kidney disease with stage 1 through stage 4 chronic kidney disease, or unspecified chronic kidney disease: Secondary | ICD-10-CM | POA: Diagnosis not present

## 2021-03-12 DIAGNOSIS — D137 Benign neoplasm of endocrine pancreas: Secondary | ICD-10-CM | POA: Diagnosis not present

## 2021-03-12 DIAGNOSIS — N1831 Chronic kidney disease, stage 3a: Secondary | ICD-10-CM | POA: Diagnosis not present

## 2021-03-13 ENCOUNTER — Other Ambulatory Visit: Payer: Self-pay | Admitting: Primary Care

## 2021-03-13 ENCOUNTER — Other Ambulatory Visit: Payer: Self-pay

## 2021-03-13 DIAGNOSIS — Z515 Encounter for palliative care: Secondary | ICD-10-CM

## 2021-03-13 DIAGNOSIS — I129 Hypertensive chronic kidney disease with stage 1 through stage 4 chronic kidney disease, or unspecified chronic kidney disease: Secondary | ICD-10-CM | POA: Diagnosis not present

## 2021-03-13 DIAGNOSIS — D378 Neoplasm of uncertain behavior of other specified digestive organs: Secondary | ICD-10-CM | POA: Diagnosis not present

## 2021-03-13 DIAGNOSIS — G309 Alzheimer's disease, unspecified: Secondary | ICD-10-CM | POA: Diagnosis not present

## 2021-03-13 DIAGNOSIS — R69 Illness, unspecified: Secondary | ICD-10-CM | POA: Diagnosis not present

## 2021-03-13 DIAGNOSIS — E162 Hypoglycemia, unspecified: Secondary | ICD-10-CM | POA: Diagnosis not present

## 2021-03-13 DIAGNOSIS — D137 Benign neoplasm of endocrine pancreas: Secondary | ICD-10-CM

## 2021-03-13 DIAGNOSIS — D631 Anemia in chronic kidney disease: Secondary | ICD-10-CM | POA: Diagnosis not present

## 2021-03-13 DIAGNOSIS — N1831 Chronic kidney disease, stage 3a: Secondary | ICD-10-CM | POA: Diagnosis not present

## 2021-03-13 DIAGNOSIS — K869 Disease of pancreas, unspecified: Secondary | ICD-10-CM | POA: Diagnosis not present

## 2021-03-13 DIAGNOSIS — F0150 Vascular dementia without behavioral disturbance: Secondary | ICD-10-CM

## 2021-03-13 DIAGNOSIS — M5136 Other intervertebral disc degeneration, lumbar region: Secondary | ICD-10-CM | POA: Diagnosis not present

## 2021-03-13 NOTE — Progress Notes (Signed)
[facility] Palliative Care Consult Note Telephone: [phone]  Fax: [phone]    Date of encounter: 03/13/21 PATIENT NAME: Alexis Rodriguez [place] [id number]   [phone] (home)  DOB: 18-May-1941 MRN: [id number] PRIMARY CARE PROVIDER:    Danelle Berry, NP,  [place] [address] [phone]  REFERRING PROVIDER:   Danelle Berry, NP [place],  [place] [address] [phone]  RESPONSIBLE PARTY:    Contact Information    Name Relation Home Work Mobile   Latash, [place] Daughter   [phone]   Ferrel Logan Relative   [phone]      I met face to face with patient and family in  Home. Palliative Care was asked to follow this patient by consultation request of  Danelle Berry, NP to address advance care planning and complex medical decision making. This is a follow up visit.                                   ASSESSMENT AND PLAN / RECOMMENDATIONS:   Advance Care Planning/Goals of Care: Goals include to maximize quality of life and symptom management. Our advance care planning conversation included a discussion about:     The value and importance of advance care planning   Exploration of personal, cultural or spiritual beliefs that might influence medical decisions   Exploration of goals of care in the event of a sudden injury or illness   Identification of a healthcare agent - daughter  Review  of an  advance directive document .  CODE STATUS:  FULL, TBD We discussed eventual hospice admission given that this insulinoma will grow and eventually result in decompensation. Daughter states she was told that the medication could also exacerbate congestive heart failure but at least would give them some time before that happened. We discussed when it would be apparent that she was at end of life, that once treatments didn't seem to work or that her quality of life was such that she would want  to de- escalate care that would be an appropriate time for hospice admission. We will continue to discuss. She is also interested in a home-based primary provider due to its great effort to transport patient. She is quite immobile now and her stamina is poor.  Symptom Management/Plan:  Weakness: Was unable to stand several days ago. Daughter reports cloudy urine, has not gotten test strips. Glucose has not spiked. Rx for UTI by pcp and seems to be improving.  Constipation: Has taken colace without effect. No bm x 3 days. Recommend otc miralax and senna tabs. Discussed for prevention of UTI.  Nutrition: Taking in food, glucose in the 130's. Not having extreme readings.  Rehab: making good progress with exercises. Has home health PT,Thinks [facility] may be involved thru June.   Sleep: Has had insomnia, taking 50 mg tramadol. May have had UTI which was exacerbating insomnia.  UTI: Rx now with cipro 7 days. Home health RN to get urine for c and s. Recommend D Mannose for prevention of sx. Continue with water and constipation management.  Follow up Palliative Care Visit: Palliative care will continue to follow for complex medical decision making, advance care planning, and clarification of goals. Return 4 weeks or prn.  I spent 60 minutes providing this consultation. More than 50% of the time in this consultation was spent in counseling and  care coordination.   PPS: 30%  HOSPICE ELIGIBILITY/DIAGNOSIS: TBD  Chief Complaint: weakness  HISTORY OF PRESENT ILLNESS:  Wynonia Medero is a 80 y.o. year old female  with insulinoma, mixed dementia,  CHF. Has had weakness and insomnia and agitation x several days. PCP Rx empirically for UTI, appears to be improving sx. Pt is able to sleep better and appears less restless  History obtained from review of EMR, discussion with primary team, and interview with family, facility staff/caregiver and/or Alexis Rodriguez.  I reviewed available labs, medications, imaging,  studies and related documents from the EMR.  Records reviewed and summarized above.   ROS / per family  General: NAD ENMT: denies dysphagia Cardiovascular:  denies DOE Pulmonary: denies cough, denies increased SOB Abdomen: endorses good appetite, endorses  constipation, endorses incontinence of bowel GU: denies dysuria, endorses incontinence of urine MSK:  Endorses weakness,  No recent  falls reported Skin: denies rashes or wounds Neurological: denies pain, endorses insomnia Psych: Endorses positive  mood Heme/lymph/immuno: denies bruises, abnormal bleeding  Physical Exam: Current and past weights: unavailable Constitutional: NAD General: frail appearing EYES: anicteric sclera, lids intact, no discharge  ENMT: intact hearing, oral mucous membranes moist Pulmonary:no increased work of breathing, no cough, room air Abdomen: intake 100%,  no ascites MSK: moderate  sarcopenia, moves all extremities, non ambulatory Skin: warm and dry, no rashes or wounds on visible skin Neuro:  ++ generalized weakness,  severe cognitive impairment Psych: non-anxious affect, A and O x 1 Hem/lymph/immuno: no widespread bruising  Patient Active Problem List   Diagnosis Date Noted  . Insomnia due to mental condition 03/02/2021  . Acute lower UTI 02/19/2021  . CAP (community acquired pneumonia) 02/19/2021  . Acute urinary retention 02/19/2021  . Insulinoma 02/19/2021  . Chronic diastolic CHF (congestive heart failure) ([place]) 02/19/2021  . Encephalopathy 02/15/2021  . Encephalopathy acute 02/14/2021  . Dementia ([place]) 12/25/2020  . Heart block   . Third degree AV block ([place])   . AKI (acute kidney injury) ([place])   . Pressure injury of skin 11/06/2020  . Stroke-like symptoms 11/04/2020  . Depression 11/04/2020  . HTN (hypertension)   . Hypoglycemia   . Hallucinations due to late onset dementia ([place])   . HLD (hyperlipidemia)   . Iron deficiency anemia   . CKD (chronic kidney disease), stage IIIa    . Mixed Alzheimer's and vascular dementia ([place]) 04/27/2016     Thank you for the opportunity to participate in the care of Ms. Hewes.  The palliative care team will continue to follow. Please call our office at [phone] if we can be of additional assistance.   Jason Coop, NP , DNP, MPH, AGPCNP-BC, ACHPN  COVID-19 PATIENT SCREENING TOOL Asked and negative response unless otherwise noted:   Have you had symptoms of covid, tested positive or been in contact with someone with symptoms/positive test in the past 5-10 days?

## 2021-03-16 ENCOUNTER — Telehealth: Payer: Self-pay

## 2021-03-16 DIAGNOSIS — I129 Hypertensive chronic kidney disease with stage 1 through stage 4 chronic kidney disease, or unspecified chronic kidney disease: Secondary | ICD-10-CM | POA: Diagnosis not present

## 2021-03-16 DIAGNOSIS — D137 Benign neoplasm of endocrine pancreas: Secondary | ICD-10-CM | POA: Diagnosis not present

## 2021-03-16 DIAGNOSIS — G309 Alzheimer's disease, unspecified: Secondary | ICD-10-CM | POA: Diagnosis not present

## 2021-03-16 DIAGNOSIS — D378 Neoplasm of uncertain behavior of other specified digestive organs: Secondary | ICD-10-CM | POA: Diagnosis not present

## 2021-03-16 DIAGNOSIS — E162 Hypoglycemia, unspecified: Secondary | ICD-10-CM | POA: Diagnosis not present

## 2021-03-16 DIAGNOSIS — R69 Illness, unspecified: Secondary | ICD-10-CM | POA: Diagnosis not present

## 2021-03-16 DIAGNOSIS — K869 Disease of pancreas, unspecified: Secondary | ICD-10-CM | POA: Diagnosis not present

## 2021-03-16 DIAGNOSIS — N1831 Chronic kidney disease, stage 3a: Secondary | ICD-10-CM | POA: Diagnosis not present

## 2021-03-16 DIAGNOSIS — M5136 Other intervertebral disc degeneration, lumbar region: Secondary | ICD-10-CM | POA: Diagnosis not present

## 2021-03-16 DIAGNOSIS — D631 Anemia in chronic kidney disease: Secondary | ICD-10-CM | POA: Diagnosis not present

## 2021-03-16 NOTE — Telephone Encounter (Signed)
Phone call placed to [facility] nurse to follow up with MD order to obtain urine specimen for UA and C&S. VM left

## 2021-03-18 DIAGNOSIS — K869 Disease of pancreas, unspecified: Secondary | ICD-10-CM | POA: Diagnosis not present

## 2021-03-18 DIAGNOSIS — R69 Illness, unspecified: Secondary | ICD-10-CM | POA: Diagnosis not present

## 2021-03-18 DIAGNOSIS — D137 Benign neoplasm of endocrine pancreas: Secondary | ICD-10-CM | POA: Diagnosis not present

## 2021-03-18 DIAGNOSIS — I129 Hypertensive chronic kidney disease with stage 1 through stage 4 chronic kidney disease, or unspecified chronic kidney disease: Secondary | ICD-10-CM | POA: Diagnosis not present

## 2021-03-18 DIAGNOSIS — D378 Neoplasm of uncertain behavior of other specified digestive organs: Secondary | ICD-10-CM | POA: Diagnosis not present

## 2021-03-18 DIAGNOSIS — E162 Hypoglycemia, unspecified: Secondary | ICD-10-CM | POA: Diagnosis not present

## 2021-03-18 DIAGNOSIS — D631 Anemia in chronic kidney disease: Secondary | ICD-10-CM | POA: Diagnosis not present

## 2021-03-18 DIAGNOSIS — N1831 Chronic kidney disease, stage 3a: Secondary | ICD-10-CM | POA: Diagnosis not present

## 2021-03-18 DIAGNOSIS — G309 Alzheimer's disease, unspecified: Secondary | ICD-10-CM | POA: Diagnosis not present

## 2021-03-18 DIAGNOSIS — M5136 Other intervertebral disc degeneration, lumbar region: Secondary | ICD-10-CM | POA: Diagnosis not present

## 2021-03-19 DIAGNOSIS — D137 Benign neoplasm of endocrine pancreas: Secondary | ICD-10-CM | POA: Diagnosis not present

## 2021-03-19 DIAGNOSIS — N1831 Chronic kidney disease, stage 3a: Secondary | ICD-10-CM | POA: Diagnosis not present

## 2021-03-19 DIAGNOSIS — K869 Disease of pancreas, unspecified: Secondary | ICD-10-CM | POA: Diagnosis not present

## 2021-03-19 DIAGNOSIS — I129 Hypertensive chronic kidney disease with stage 1 through stage 4 chronic kidney disease, or unspecified chronic kidney disease: Secondary | ICD-10-CM | POA: Diagnosis not present

## 2021-03-19 DIAGNOSIS — R69 Illness, unspecified: Secondary | ICD-10-CM | POA: Diagnosis not present

## 2021-03-19 DIAGNOSIS — M5136 Other intervertebral disc degeneration, lumbar region: Secondary | ICD-10-CM | POA: Diagnosis not present

## 2021-03-19 DIAGNOSIS — G309 Alzheimer's disease, unspecified: Secondary | ICD-10-CM | POA: Diagnosis not present

## 2021-03-19 DIAGNOSIS — D378 Neoplasm of uncertain behavior of other specified digestive organs: Secondary | ICD-10-CM | POA: Diagnosis not present

## 2021-03-19 DIAGNOSIS — E162 Hypoglycemia, unspecified: Secondary | ICD-10-CM | POA: Diagnosis not present

## 2021-03-19 DIAGNOSIS — D631 Anemia in chronic kidney disease: Secondary | ICD-10-CM | POA: Diagnosis not present

## 2021-03-20 DIAGNOSIS — N390 Urinary tract infection, site not specified: Secondary | ICD-10-CM | POA: Diagnosis not present

## 2021-03-23 DIAGNOSIS — D378 Neoplasm of uncertain behavior of other specified digestive organs: Secondary | ICD-10-CM | POA: Diagnosis not present

## 2021-03-23 DIAGNOSIS — E162 Hypoglycemia, unspecified: Secondary | ICD-10-CM | POA: Diagnosis not present

## 2021-03-23 DIAGNOSIS — M5136 Other intervertebral disc degeneration, lumbar region: Secondary | ICD-10-CM | POA: Diagnosis not present

## 2021-03-23 DIAGNOSIS — G309 Alzheimer's disease, unspecified: Secondary | ICD-10-CM | POA: Diagnosis not present

## 2021-03-23 DIAGNOSIS — N1831 Chronic kidney disease, stage 3a: Secondary | ICD-10-CM | POA: Diagnosis not present

## 2021-03-23 DIAGNOSIS — R69 Illness, unspecified: Secondary | ICD-10-CM | POA: Diagnosis not present

## 2021-03-23 DIAGNOSIS — K869 Disease of pancreas, unspecified: Secondary | ICD-10-CM | POA: Diagnosis not present

## 2021-03-23 DIAGNOSIS — D631 Anemia in chronic kidney disease: Secondary | ICD-10-CM | POA: Diagnosis not present

## 2021-03-23 DIAGNOSIS — S31819A Unspecified open wound of right buttock, initial encounter: Secondary | ICD-10-CM | POA: Diagnosis not present

## 2021-03-23 DIAGNOSIS — D137 Benign neoplasm of endocrine pancreas: Secondary | ICD-10-CM | POA: Diagnosis not present

## 2021-03-23 DIAGNOSIS — I129 Hypertensive chronic kidney disease with stage 1 through stage 4 chronic kidney disease, or unspecified chronic kidney disease: Secondary | ICD-10-CM | POA: Diagnosis not present

## 2021-03-26 DIAGNOSIS — D631 Anemia in chronic kidney disease: Secondary | ICD-10-CM | POA: Diagnosis not present

## 2021-03-26 DIAGNOSIS — D378 Neoplasm of uncertain behavior of other specified digestive organs: Secondary | ICD-10-CM | POA: Diagnosis not present

## 2021-03-26 DIAGNOSIS — I129 Hypertensive chronic kidney disease with stage 1 through stage 4 chronic kidney disease, or unspecified chronic kidney disease: Secondary | ICD-10-CM | POA: Diagnosis not present

## 2021-03-26 DIAGNOSIS — N1831 Chronic kidney disease, stage 3a: Secondary | ICD-10-CM | POA: Diagnosis not present

## 2021-03-26 DIAGNOSIS — G309 Alzheimer's disease, unspecified: Secondary | ICD-10-CM | POA: Diagnosis not present

## 2021-03-26 DIAGNOSIS — D137 Benign neoplasm of endocrine pancreas: Secondary | ICD-10-CM | POA: Diagnosis not present

## 2021-03-26 DIAGNOSIS — S31819A Unspecified open wound of right buttock, initial encounter: Secondary | ICD-10-CM | POA: Diagnosis not present

## 2021-03-26 DIAGNOSIS — M5136 Other intervertebral disc degeneration, lumbar region: Secondary | ICD-10-CM | POA: Diagnosis not present

## 2021-03-26 DIAGNOSIS — E162 Hypoglycemia, unspecified: Secondary | ICD-10-CM | POA: Diagnosis not present

## 2021-03-26 DIAGNOSIS — K869 Disease of pancreas, unspecified: Secondary | ICD-10-CM | POA: Diagnosis not present

## 2021-03-26 DIAGNOSIS — R69 Illness, unspecified: Secondary | ICD-10-CM | POA: Diagnosis not present

## 2021-03-30 DIAGNOSIS — D378 Neoplasm of uncertain behavior of other specified digestive organs: Secondary | ICD-10-CM | POA: Diagnosis not present

## 2021-03-30 DIAGNOSIS — N1831 Chronic kidney disease, stage 3a: Secondary | ICD-10-CM | POA: Diagnosis not present

## 2021-03-30 DIAGNOSIS — D137 Benign neoplasm of endocrine pancreas: Secondary | ICD-10-CM | POA: Diagnosis not present

## 2021-03-30 DIAGNOSIS — K869 Disease of pancreas, unspecified: Secondary | ICD-10-CM | POA: Diagnosis not present

## 2021-03-30 DIAGNOSIS — I129 Hypertensive chronic kidney disease with stage 1 through stage 4 chronic kidney disease, or unspecified chronic kidney disease: Secondary | ICD-10-CM | POA: Diagnosis not present

## 2021-03-30 DIAGNOSIS — M5136 Other intervertebral disc degeneration, lumbar region: Secondary | ICD-10-CM | POA: Diagnosis not present

## 2021-03-30 DIAGNOSIS — G309 Alzheimer's disease, unspecified: Secondary | ICD-10-CM | POA: Diagnosis not present

## 2021-03-30 DIAGNOSIS — R69 Illness, unspecified: Secondary | ICD-10-CM | POA: Diagnosis not present

## 2021-03-30 DIAGNOSIS — E162 Hypoglycemia, unspecified: Secondary | ICD-10-CM | POA: Diagnosis not present

## 2021-03-30 DIAGNOSIS — D631 Anemia in chronic kidney disease: Secondary | ICD-10-CM | POA: Diagnosis not present

## 2021-03-31 ENCOUNTER — Telehealth: Payer: Self-pay | Admitting: Primary Care

## 2021-03-31 ENCOUNTER — Other Ambulatory Visit: Payer: Self-pay

## 2021-03-31 ENCOUNTER — Other Ambulatory Visit: Payer: Self-pay | Admitting: Primary Care

## 2021-03-31 NOTE — Telephone Encounter (Signed)
T/c from daughter, returned call. Patient family ready for hospice. Not making progress with home health and needs supportive care of hospice. Have put in referral for hospice admission. PCP [facility] Charlynn Grimes has requested hospice.

## 2021-04-05 DIAGNOSIS — E162 Hypoglycemia, unspecified: Secondary | ICD-10-CM | POA: Diagnosis not present

## 2021-04-05 DIAGNOSIS — M6281 Muscle weakness (generalized): Secondary | ICD-10-CM | POA: Diagnosis not present

## 2021-04-05 DIAGNOSIS — N186 End stage renal disease: Secondary | ICD-10-CM | POA: Diagnosis not present

## 2021-04-05 DIAGNOSIS — R262 Difficulty in walking, not elsewhere classified: Secondary | ICD-10-CM | POA: Diagnosis not present

## 2021-04-07 ENCOUNTER — Other Ambulatory Visit: Payer: Self-pay | Admitting: Primary Care

## 2021-04-27 DIAGNOSIS — I959 Hypotension, unspecified: Secondary | ICD-10-CM | POA: Diagnosis not present

## 2021-04-27 DIAGNOSIS — R279 Unspecified lack of coordination: Secondary | ICD-10-CM | POA: Diagnosis not present

## 2021-04-27 DIAGNOSIS — Z743 Need for continuous supervision: Secondary | ICD-10-CM | POA: Diagnosis not present

## 2021-05-06 DIAGNOSIS — N186 End stage renal disease: Secondary | ICD-10-CM | POA: Diagnosis not present

## 2021-05-06 DIAGNOSIS — R262 Difficulty in walking, not elsewhere classified: Secondary | ICD-10-CM | POA: Diagnosis not present

## 2021-05-06 DIAGNOSIS — M6281 Muscle weakness (generalized): Secondary | ICD-10-CM | POA: Diagnosis not present

## 2021-05-06 DIAGNOSIS — E162 Hypoglycemia, unspecified: Secondary | ICD-10-CM | POA: Diagnosis not present

## 2021-06-04 DIAGNOSIS — R410 Disorientation, unspecified: Secondary | ICD-10-CM | POA: Diagnosis not present

## 2021-06-04 DIAGNOSIS — R404 Transient alteration of awareness: Secondary | ICD-10-CM | POA: Diagnosis not present

## 2021-06-04 DIAGNOSIS — Z743 Need for continuous supervision: Secondary | ICD-10-CM | POA: Diagnosis not present

## 2021-06-04 DIAGNOSIS — W19XXXA Unspecified fall, initial encounter: Secondary | ICD-10-CM | POA: Diagnosis not present

## 2021-06-04 DIAGNOSIS — R279 Unspecified lack of coordination: Secondary | ICD-10-CM | POA: Diagnosis not present

## 2021-06-05 DIAGNOSIS — R339 Retention of urine, unspecified: Secondary | ICD-10-CM | POA: Diagnosis not present

## 2021-06-05 DIAGNOSIS — R262 Difficulty in walking, not elsewhere classified: Secondary | ICD-10-CM | POA: Diagnosis not present

## 2021-06-05 DIAGNOSIS — N186 End stage renal disease: Secondary | ICD-10-CM | POA: Diagnosis not present

## 2021-06-05 DIAGNOSIS — R69 Illness, unspecified: Secondary | ICD-10-CM | POA: Diagnosis not present

## 2021-06-05 DIAGNOSIS — M6281 Muscle weakness (generalized): Secondary | ICD-10-CM | POA: Diagnosis not present

## 2021-06-05 DIAGNOSIS — I129 Hypertensive chronic kidney disease with stage 1 through stage 4 chronic kidney disease, or unspecified chronic kidney disease: Secondary | ICD-10-CM | POA: Diagnosis not present

## 2021-06-05 DIAGNOSIS — G4700 Insomnia, unspecified: Secondary | ICD-10-CM | POA: Diagnosis not present

## 2021-06-05 DIAGNOSIS — E162 Hypoglycemia, unspecified: Secondary | ICD-10-CM | POA: Diagnosis not present

## 2021-06-06 DIAGNOSIS — Z743 Need for continuous supervision: Secondary | ICD-10-CM | POA: Diagnosis not present

## 2021-06-06 DIAGNOSIS — R0902 Hypoxemia: Secondary | ICD-10-CM | POA: Diagnosis not present

## 2021-06-06 DIAGNOSIS — M2550 Pain in unspecified joint: Secondary | ICD-10-CM | POA: Diagnosis not present

## 2021-06-06 DIAGNOSIS — R531 Weakness: Secondary | ICD-10-CM | POA: Diagnosis not present

## 2021-06-06 DIAGNOSIS — Z7401 Bed confinement status: Secondary | ICD-10-CM | POA: Diagnosis not present

## 2021-07-06 DIAGNOSIS — M6281 Muscle weakness (generalized): Secondary | ICD-10-CM | POA: Diagnosis not present

## 2021-07-06 DIAGNOSIS — R262 Difficulty in walking, not elsewhere classified: Secondary | ICD-10-CM | POA: Diagnosis not present

## 2021-07-06 DIAGNOSIS — N186 End stage renal disease: Secondary | ICD-10-CM | POA: Diagnosis not present

## 2021-07-06 DIAGNOSIS — E162 Hypoglycemia, unspecified: Secondary | ICD-10-CM | POA: Diagnosis not present

## 2021-08-06 DIAGNOSIS — R262 Difficulty in walking, not elsewhere classified: Secondary | ICD-10-CM | POA: Diagnosis not present

## 2021-08-06 DIAGNOSIS — N186 End stage renal disease: Secondary | ICD-10-CM | POA: Diagnosis not present

## 2021-08-06 DIAGNOSIS — M6281 Muscle weakness (generalized): Secondary | ICD-10-CM | POA: Diagnosis not present

## 2021-08-06 DIAGNOSIS — E162 Hypoglycemia, unspecified: Secondary | ICD-10-CM | POA: Diagnosis not present

## 2021-08-18 DIAGNOSIS — A409 Streptococcal sepsis, unspecified: Secondary | ICD-10-CM | POA: Diagnosis not present

## 2021-08-18 DIAGNOSIS — G301 Alzheimer's disease with late onset: Secondary | ICD-10-CM | POA: Diagnosis not present

## 2021-08-18 DIAGNOSIS — A408 Other streptococcal sepsis: Secondary | ICD-10-CM | POA: Diagnosis not present

## 2021-10-15 DEATH — deceased

## 2022-09-24 LAB — PROINSULIN/INSULIN RATIO
Insulin: 58 u[IU]/mL — ABNORMAL HIGH
Proinsulin: 41 pmol/L — ABNORMAL HIGH

## 2023-02-04 IMAGING — CT CT CHEST W/O CM
2 of 3 series · 15 of 36 positions shown, 18 images · non-contrast
Comparison: Chest radiograph 02/14/2021

CLINICAL DATA: Abnormal x-ray. Interstitial infiltrates. Weakness
since [REDACTED].

EXAM:
CT CHEST WITHOUT CONTRAST
TECHNIQUE: Multidetector CT imaging of the chest was performed following the
standard protocol without IV contrast.

[Series 2: thorax · axial · 0.71mm/px · z∈[+262,+500]mm · 12 of 141 slices shown, 15 images]
[im 11/141  mediastinal]
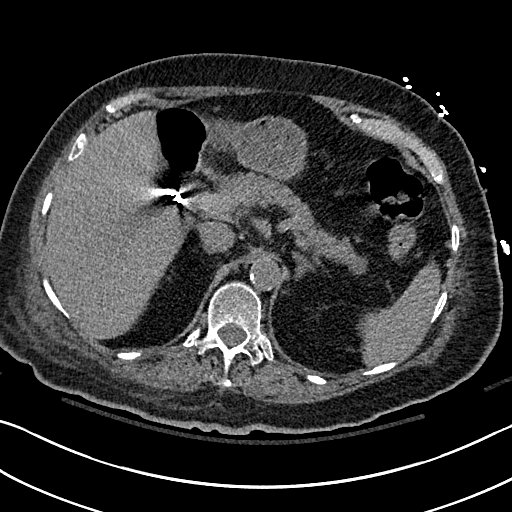
[im 11/141  lung]
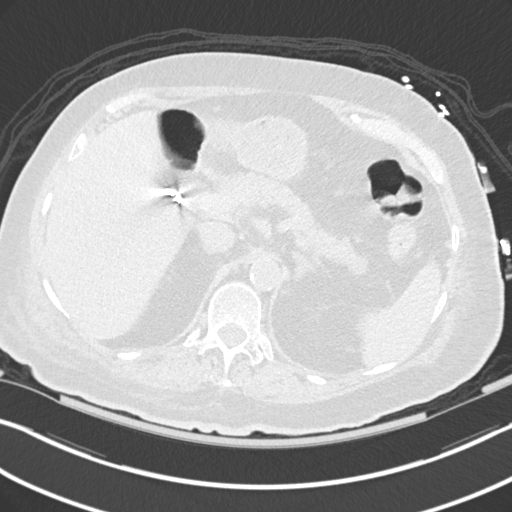
[im 21/141  lung]
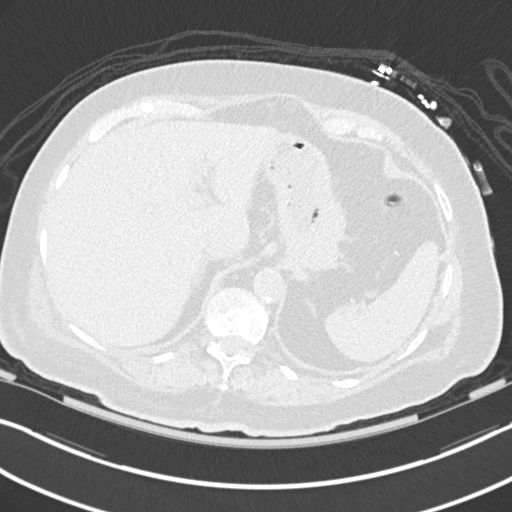
[im 32/141  lung]
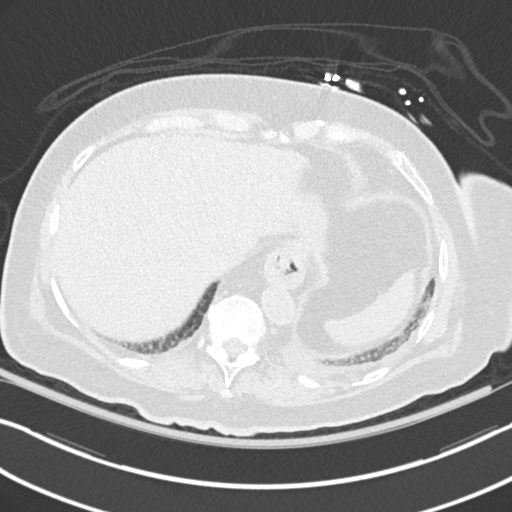
[im 42/141  lung]
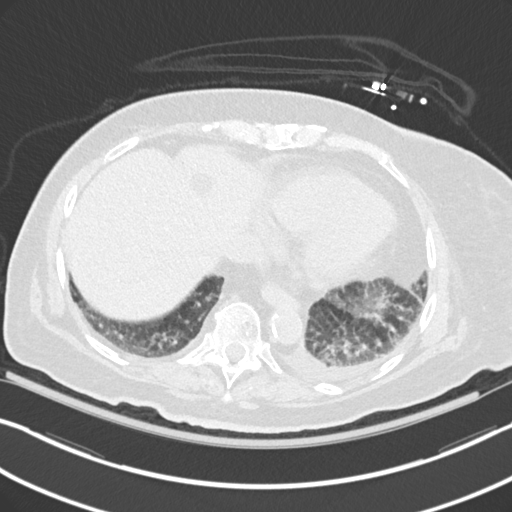
[im 52/141  mediastinal]
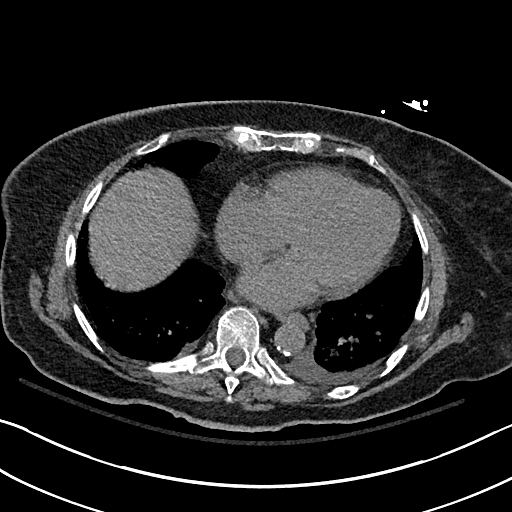
[im 52/141  lung]
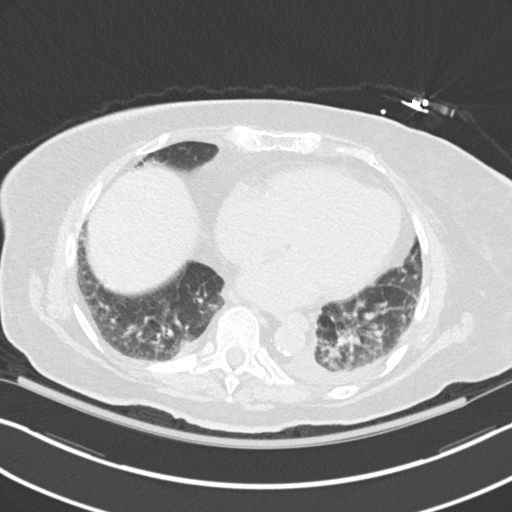
[im 63/141  lung]
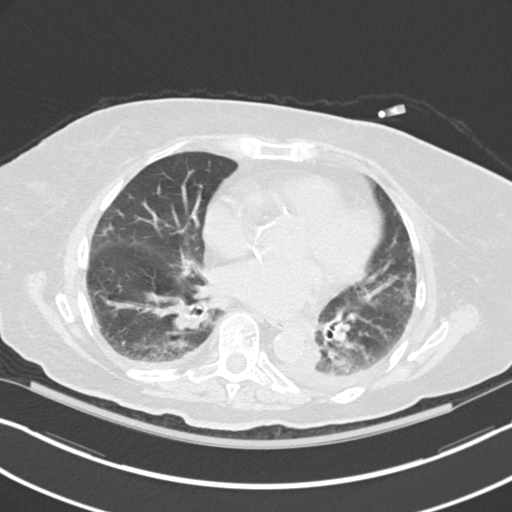
[im 78/141  lung]
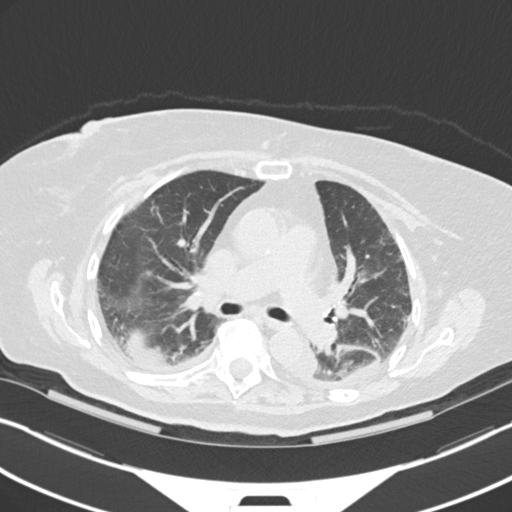
[im 89/141  lung]
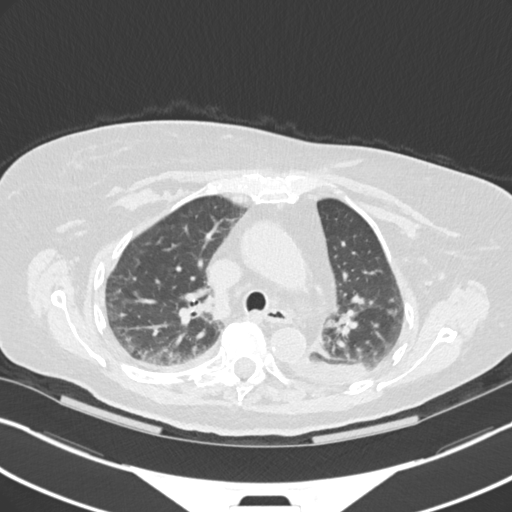
[im 99/141  mediastinal]
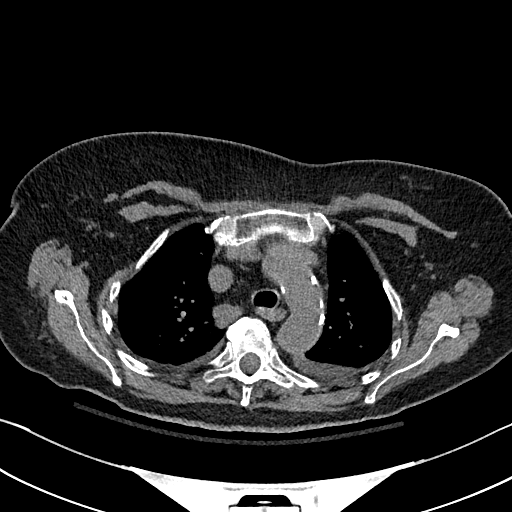
[im 99/141  lung]
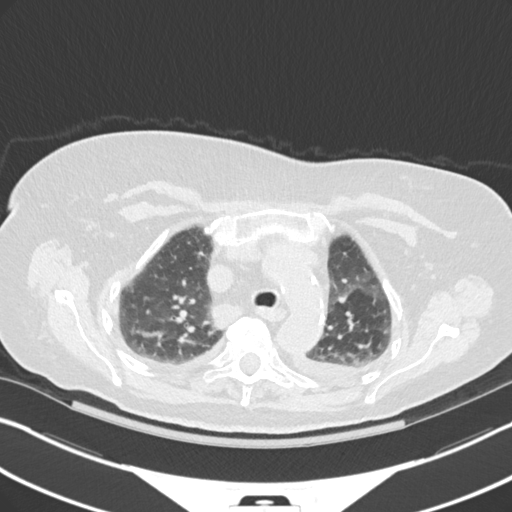
[im 109/141  lung]
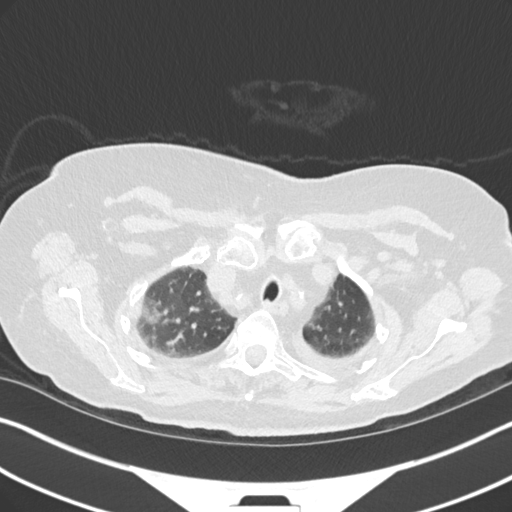
[im 120/141  lung]
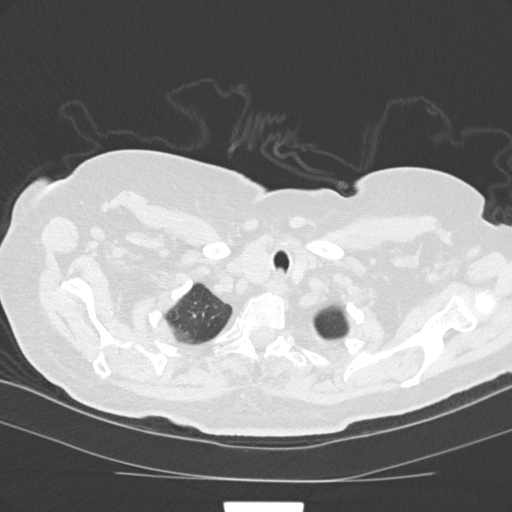
[im 130/141  lung]
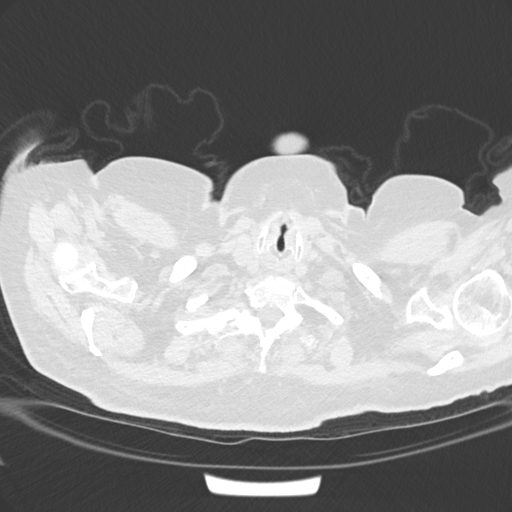

[Series 5: coronal · coronal · 0.63mm/px · 3 of 154 slices shown]
[im 31/154  lung]
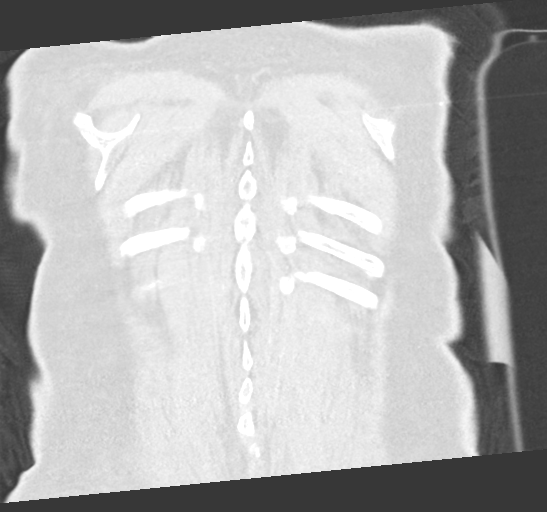
[im 62/154  lung]
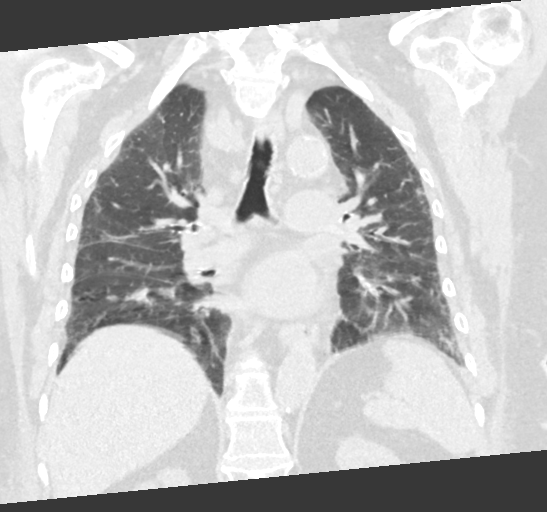
[im 92/154  lung]
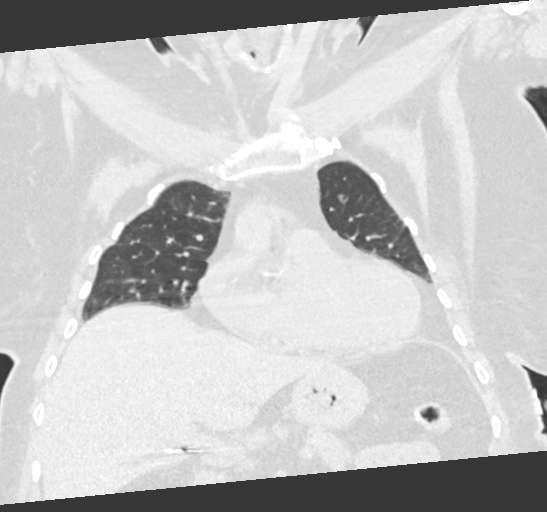

[15 of 36 positions shown; findings below may reference images not displayed]

FINDINGS: Cardiovascular: Normal heart size. No pericardial effusions.
Coronary artery and aortic calcification. No aortic aneurysm.

Mediastinum/Nodes: Prominent lymph nodes in the mediastinum without
pathologic enlargement, likely reactive. Esophagus is decompressed.
Small esophageal hiatal hernia.

Lungs/Pleura: Small bilateral pleural effusions. Interstitial
changes in the lungs may represent edema or interstitial
pneumonitis. Bronchial wall thickening consistent with chronic
bronchitis. No pneumothorax.

Upper Abdomen: Cyst in the left lobe of the liver. Surgical absence
of the gallbladder. No acute abnormalities are indicated.

Musculoskeletal: Degenerative changes in the spine. No destructive
bone lesions.
IMPRESSION: 1. Small bilateral pleural effusions. Interstitial changes in the
lungs may represent edema or interstitial pneumonitis. Bronchial
wall thickening consistent with chronic bronchitis.
2. Small esophageal hiatal hernia.
3. Aortic atherosclerosis.

Aortic Atherosclerosis ([id number]).
